# Patient Record
Sex: Female | Born: 1950
Health system: Southern US, Community
[De-identification: ages and names within clinical notes are randomized; demographics above are authoritative.]

## PROBLEM LIST (undated history)

## (undated) DIAGNOSIS — R5383 Other fatigue: Secondary | ICD-10-CM

## (undated) DIAGNOSIS — M5030 Other cervical disc degeneration, unspecified cervical region: Secondary | ICD-10-CM

## (undated) DIAGNOSIS — N979 Female infertility, unspecified: Secondary | ICD-10-CM

## (undated) DIAGNOSIS — M51369 Other intervertebral disc degeneration, lumbar region without mention of lumbar back pain or lower extremity pain: Secondary | ICD-10-CM

## (undated) DIAGNOSIS — M797 Fibromyalgia: Secondary | ICD-10-CM

## (undated) DIAGNOSIS — J45909 Unspecified asthma, uncomplicated: Secondary | ICD-10-CM

## (undated) DIAGNOSIS — M5136 Other intervertebral disc degeneration, lumbar region: Secondary | ICD-10-CM

## (undated) DIAGNOSIS — M1990 Unspecified osteoarthritis, unspecified site: Secondary | ICD-10-CM

## (undated) HISTORY — DX: Other fatigue: R53.83

## (undated) HISTORY — DX: Unspecified asthma, uncomplicated: J45.909

## (undated) HISTORY — DX: Fibromyalgia: M79.7

## (undated) HISTORY — DX: Other intervertebral disc degeneration, lumbar region: M51.36

## (undated) HISTORY — DX: Other cervical disc degeneration, unspecified cervical region: M50.30

## (undated) HISTORY — DX: Female infertility, unspecified: N97.9

## (undated) HISTORY — PX: GALLBLADDER SURGERY: SHX652

## (undated) HISTORY — DX: Unspecified osteoarthritis, unspecified site: M19.90

## (undated) HISTORY — PX: FOOT SURGERY: SHX648

## (undated) HISTORY — PX: KNEE SURGERY: SHX244

## (undated) HISTORY — DX: Other intervertebral disc degeneration, lumbar region without mention of lumbar back pain or lower extremity pain: M51.369

---

## 1998-02-06 ENCOUNTER — Encounter: Payer: Self-pay | Admitting: Emergency Medicine

## 1998-02-06 ENCOUNTER — Emergency Department (HOSPITAL_COMMUNITY): Admission: EM | Admit: 1998-02-06 | Discharge: 1998-02-06 | Payer: Self-pay | Admitting: Emergency Medicine

## 1998-03-01 ENCOUNTER — Encounter: Payer: Self-pay | Admitting: Orthopedic Surgery

## 1998-03-01 ENCOUNTER — Ambulatory Visit (HOSPITAL_COMMUNITY): Admission: RE | Admit: 1998-03-01 | Discharge: 1998-03-01 | Payer: Self-pay | Admitting: Orthopedic Surgery

## 1998-08-12 ENCOUNTER — Other Ambulatory Visit: Admission: RE | Admit: 1998-08-12 | Discharge: 1998-08-12 | Payer: Self-pay | Admitting: Gynecology

## 1998-11-25 ENCOUNTER — Ambulatory Visit (HOSPITAL_BASED_OUTPATIENT_CLINIC_OR_DEPARTMENT_OTHER): Admission: RE | Admit: 1998-11-25 | Discharge: 1998-11-25 | Payer: Self-pay | Admitting: Orthopedic Surgery

## 1999-02-26 ENCOUNTER — Other Ambulatory Visit: Admission: RE | Admit: 1999-02-26 | Discharge: 1999-02-26 | Payer: Self-pay | Admitting: Gynecology

## 1999-04-18 ENCOUNTER — Emergency Department (HOSPITAL_COMMUNITY): Admission: EM | Admit: 1999-04-18 | Discharge: 1999-04-19 | Payer: Self-pay | Admitting: Emergency Medicine

## 1999-04-18 ENCOUNTER — Encounter: Payer: Self-pay | Admitting: Emergency Medicine

## 1999-09-28 ENCOUNTER — Other Ambulatory Visit: Admission: RE | Admit: 1999-09-28 | Discharge: 1999-09-28 | Payer: Self-pay | Admitting: Gynecology

## 2000-03-09 ENCOUNTER — Other Ambulatory Visit: Admission: RE | Admit: 2000-03-09 | Discharge: 2000-03-09 | Payer: Self-pay | Admitting: Gynecology

## 2000-08-31 ENCOUNTER — Other Ambulatory Visit: Admission: RE | Admit: 2000-08-31 | Discharge: 2000-08-31 | Payer: Self-pay | Admitting: Gynecology

## 2001-03-15 ENCOUNTER — Other Ambulatory Visit: Admission: RE | Admit: 2001-03-15 | Discharge: 2001-03-15 | Payer: Self-pay | Admitting: Gynecology

## 2002-03-21 ENCOUNTER — Other Ambulatory Visit: Admission: RE | Admit: 2002-03-21 | Discharge: 2002-03-21 | Payer: Self-pay | Admitting: Gynecology

## 2003-04-25 ENCOUNTER — Other Ambulatory Visit: Admission: RE | Admit: 2003-04-25 | Discharge: 2003-04-25 | Payer: Self-pay | Admitting: Gynecology

## 2003-11-28 ENCOUNTER — Ambulatory Visit: Payer: Self-pay | Admitting: Internal Medicine

## 2004-01-09 ENCOUNTER — Ambulatory Visit: Payer: Self-pay | Admitting: Internal Medicine

## 2004-01-13 ENCOUNTER — Ambulatory Visit (HOSPITAL_COMMUNITY): Admission: RE | Admit: 2004-01-13 | Discharge: 2004-01-13 | Payer: Self-pay | Admitting: Internal Medicine

## 2004-01-16 ENCOUNTER — Ambulatory Visit: Payer: Self-pay | Admitting: Internal Medicine

## 2004-01-20 ENCOUNTER — Encounter: Admission: RE | Admit: 2004-01-20 | Discharge: 2004-01-20 | Payer: Self-pay | Admitting: Internal Medicine

## 2004-01-24 ENCOUNTER — Ambulatory Visit: Payer: Self-pay | Admitting: Internal Medicine

## 2004-04-15 ENCOUNTER — Ambulatory Visit: Payer: Self-pay | Admitting: Internal Medicine

## 2004-04-30 ENCOUNTER — Other Ambulatory Visit: Admission: RE | Admit: 2004-04-30 | Discharge: 2004-04-30 | Payer: Self-pay | Admitting: Gynecology

## 2004-05-15 ENCOUNTER — Ambulatory Visit (HOSPITAL_COMMUNITY): Admission: RE | Admit: 2004-05-15 | Discharge: 2004-05-15 | Payer: Self-pay | Admitting: Surgery

## 2004-05-15 ENCOUNTER — Encounter (INDEPENDENT_AMBULATORY_CARE_PROVIDER_SITE_OTHER): Payer: Self-pay | Admitting: Specialist

## 2005-02-23 ENCOUNTER — Other Ambulatory Visit: Admission: RE | Admit: 2005-02-23 | Discharge: 2005-02-23 | Payer: Self-pay | Admitting: Obstetrics and Gynecology

## 2005-06-30 ENCOUNTER — Ambulatory Visit: Payer: Self-pay | Admitting: Internal Medicine

## 2005-09-03 ENCOUNTER — Ambulatory Visit: Payer: Self-pay | Admitting: Internal Medicine

## 2008-12-10 ENCOUNTER — Emergency Department (HOSPITAL_COMMUNITY): Admission: EM | Admit: 2008-12-10 | Discharge: 2008-12-11 | Payer: Self-pay | Admitting: Emergency Medicine

## 2008-12-17 ENCOUNTER — Encounter: Admission: RE | Admit: 2008-12-17 | Discharge: 2008-12-17 | Payer: Self-pay | Admitting: Family Medicine

## 2009-06-27 ENCOUNTER — Encounter: Admission: RE | Admit: 2009-06-27 | Discharge: 2009-06-27 | Payer: Self-pay | Admitting: Family Medicine

## 2009-09-22 ENCOUNTER — Ambulatory Visit (HOSPITAL_BASED_OUTPATIENT_CLINIC_OR_DEPARTMENT_OTHER): Admission: RE | Admit: 2009-09-22 | Discharge: 2009-09-22 | Payer: Self-pay | Admitting: Rheumatology

## 2009-09-27 ENCOUNTER — Ambulatory Visit: Payer: Self-pay | Admitting: Internal Medicine

## 2010-01-08 ENCOUNTER — Encounter
Admission: RE | Admit: 2010-01-08 | Discharge: 2010-01-08 | Payer: Self-pay | Source: Home / Self Care | Attending: Obstetrics and Gynecology | Admitting: Obstetrics and Gynecology

## 2010-02-01 ENCOUNTER — Encounter: Payer: Self-pay | Admitting: Obstetrics and Gynecology

## 2010-02-01 ENCOUNTER — Encounter: Payer: Self-pay | Admitting: Oral and Maxillofacial Surgery

## 2010-04-14 LAB — POCT I-STAT, CHEM 8
Calcium, Ion: 1.13 mmol/L (ref 1.12–1.32)
Chloride: 96 mEq/L (ref 96–112)
HCT: 37 % (ref 36.0–46.0)
Potassium: 3.9 mEq/L (ref 3.5–5.1)

## 2010-04-15 LAB — COMPREHENSIVE METABOLIC PANEL
ALT: 44 U/L — ABNORMAL HIGH (ref 0–35)
AST: 22 U/L (ref 0–37)
Albumin: 3.9 g/dL (ref 3.5–5.2)
Alkaline Phosphatase: 83 U/L (ref 39–117)
GFR calc Af Amer: >60 mL/min (ref >60)
Glucose, Bld: 175 mg/dL — ABNORMAL HIGH (ref 70–99)
Potassium: 3.5 mEq/L (ref 3.5–5.1)
Sodium: 128 mEq/L — ABNORMAL LOW (ref 135–145)
Total Protein: 7.4 g/dL (ref 6.0–8.3)

## 2010-04-15 LAB — CBC
Hemoglobin: 12.6 g/dL (ref 12.0–15.0)
RDW: 14.2 % (ref 11.5–15.5)

## 2010-04-15 LAB — DIFFERENTIAL
Basophils Relative: 0 % (ref 0–1)
Eosinophils Absolute: 0.4 10*3/uL (ref 0.0–0.7)
Eosinophils Relative: 3 % (ref 0–5)
Monocytes Absolute: 0.6 10*3/uL (ref 0.1–1.0)
Monocytes Relative: 4 % (ref 3–12)

## 2010-04-15 LAB — URINALYSIS, ROUTINE W REFLEX MICROSCOPIC
Ketones, ur: 15 mg/dL — AB
Nitrite: NEGATIVE
Specific Gravity, Urine: 1.022 (ref 1.005–1.030)
pH: 6.5 (ref 5.0–8.0)

## 2010-05-29 NOTE — Assessment & Plan Note (Signed)
 HEALTHCARE                           GASTROENTEROLOGY OFFICE NOTE   Lauren Bradley, Lauren Bradley                      MRN:          [id number]  DATE:09/03/2005                            DOB:          1950-11-25    Lauren Bradley is a 60 year old white female with serial constipation and  obstipation, positive for Sitz marks for bowel transit.  She was on Miralax,  up to 34 grams twice a day, without any improvement, but she has found an  [place] preparation of multi-herb 120 capsules, she take 2 twice a day,  which completely controls her constipation.  She was found to be osteopenic,  and started on Actonel, but it caused her GI discomfort.  She switched to  [facility], which again she could not tolerate.   PHYSICAL EXAMINATION:  VITAL SIGNS:  Blood pressure 122/72, pulse 72, weight  105 pounds, which is stable.  Today her physical exam was normal.  LUNGS:  Clear to auscultation.  CARDIAC:  Cor with normal S1, normal S2.  ABDOMEN:  Soft, normoactive bowel sounds, nontender, no distention, no  palpable mass.  RECTAL:  Not done.   IMPRESSION:  44. 60 year old white female with colonic inertia, a redundant colon,      positive Sitz marks, currently doing well on multi-herb laxative.  2. Gastroesophageal reflux.  3. Intolerance to Actonel/Boniva.   PLAN:  1. I advised the patient to reduce the dose of the Boniva or Actonel, but      continue taking it because of the osteopenia.  2. Refills for Prevacid 30 mg a day dispense #90.  3. Continue multi-herb.  4. I will see her on a p.r.n. basis.                                   Hedwig Morton. Juanda Chance, MD   DMB/MedQ  DD:  09/03/2005  DT:  09/04/2005  Job #:  [id number]   cc:   Stacie Acres. Cliffton Asters, MD

## 2010-05-29 NOTE — Op Note (Signed)
[place]  Patient:    Lauren Bradley                       MRN: [id number] Proc. Date: 11/25/98 Adm. Date:  [id number] Attending:  Derrek Monaco CC:         Katy Fitch. Sypher, Montez Hageman., M.D. x 2                           Operative Report  PREOPERATIVE DIAGNOSIS:  Status post open reduction/internal fixation of left olecranon interarticular comminuted fracture with retained hardware.  POSTOPERATIVE DIAGNOSIS:  Status post open reduction/internal fixation of left olecranon interarticular comminuted fracture with retained hardware.  PROCEDURE:  Removal of retained 6.5 mm cancellous screw and washer, left olecranon.  SURGEON:  Katy Fitch. Sypher, Montez Hageman., M.D.  ASSISTANT:  None.  ANESTHESIA:  Axillary block.  ANESTHESIOLOGIST:  Dr. Krista Blue.  INDICATIONS:  [name] is approaching one year following a comminuted olecranon fracture sustained in a fall.  She underwent primary open reduction/internal fixation with use of a Vicryl tension band.  Unfortunately, ue to severe osteopenia, her fixation loosened and she required a second reapplication of a cancellous screw and washer to obtain adequate fixation.  She went on to heal  her fracture with the aid of an ultrasonic bone stimulator and is now trouble by her hardware irritating the triceps tendon.  She requested hardware removal.  After informed consent, she is brought to the operating room at this time for removal of her internal fixation foreign material.  PROCEDURE:  [name] is brought to the operating room and placed supine position on the operating room table.  Following axillary block placed in the holding area by Dr. Krista Blue, anesthesia was satisfactory in the left arm.  The arm was prepped with Betadine soap and solution and sterilely draped.  Following exsanguination of the limb with Esmarch bandage, arterial tourniquet is inflated to 240 mmHg.  Procedure commenced with  excision of previous surgical scar.  Subcutaneous tissues are carefully divided, taking care to palpate the hardware within the triceps tendon.  Her tendon had healed imperceptibly over the hardware, therefore, a C-arm fluoroscope was used with a 27-gauge needle to locate the screw.  Once this was  anatomically located with precision, the triceps tendon was split and the hardware immediately visualized.  The screw was removed without difficulty utilizing an AO screwdriver and the washer was removed with a Kocher clamp.  The tendon was split in its longitudinal fibers and the insertion of the tendon at the olecranon was not significantly disturbed.  The wound was inspected for bleeding points, which were electrocauterized with bipolar current, followed by repair of the skin with intradermal 3-0 Prolene suture.  A compressive dressing was applied with an Ace wrap.  There are no apparent complications.  Ms. Gorniak was placed in sling and transferred to the recovery room with stable vital signs.  Aftercare: she is given a prescription for Vicodin 1 or 2 tablets p.o. q.4-6h. p.r.n. pain total of 20 tablets without refill.  She will return to the office in follow-up in approximately 10 days for suture removal. DD:  11/25/98 TD:  11/26/98 Job: [id number]

## 2010-05-29 NOTE — Op Note (Signed)
Lauren Bradley, Lauren Bradley NO.:  [id number]   MEDICAL RECORD NO.:  [id number]          PATIENT TYPE:  AMB   LOCATION:  DAY                          FACILITY[facility]  [facility]   PHYSICIAN:  Currie Paris, M.D.DATE OF BIRTH:  June 25, 1950   DATE OF PROCEDURE:  05/15/2004  DATE OF DISCHARGE:                                 OPERATIVE REPORT   OFFICE MEDICAL RECORD NUMBER:  [id number]   PREOPERATIVE DIAGNOSES:  Biliary dyskinesia and chronic cholecystitis.   POSTOPERATIVE DIAGNOSES:  Biliary dyskinesia and chronic cholecystitis.   OPERATION:  Laparoscopic cholecystectomy with operative cholangiogram and  visualization of the left ovary.   SURGEON:  Dr. Jamey Ripa   ASSISTANT:  Dr. Orson Slick   ANESTHESIA:  General endotracheal.   CLINICAL HISTORY:  This lady has had biliary-type symptoms and an abnormal  gallbladder ejection fraction, although no stones have been found.  She has  been evaluated by her gastroenterologist, Dr. Lina Sar, and it thought  appropriate at this point to proceed to cholecystectomy.  On her recent exam  by Dr. Chevis Pretty, he was unable to palpate a left ovary, and asked [place] to try to  visualize it laparoscopically at the time of her surgery.   DESCRIPTION OF PROCEDURE:  The patient was seen in the holding area, and she  no further questions.  She was taken to the operating room and after  satisfactory general endotracheal anesthesia had been obtained, the abdomen  was prepped and draped.  The time-out occurred.   Marcaine 0.25% plain was used for each incision.  A vertical umbilical  incision was made, the fascia opened, and the peritoneal cavity entered  under direct vision.  A pursestring was placed, the Hasson introduced, and  the abdomen insufflated to 15.  Additional trocars were placed in the  epigastrium and two in the right upper quadrant.  The patient was placed in  Trendelenburg, and we were able to visualize the uterus which appeared to be  relatively normal, and the left ovary which was tucked a little behind her  sigmoid, looked very atrophic but otherwise unremarkable.  There was a  little tethering of the round ligament up to the anterior abdominal wall.   Attention was then turned back to the gallbladder.  The patient was placed  in reverse Trendelenburg.  The gallbladder was retracted over the liver.  The perineum over the cystic duct was opened with the gallbladder neck being  retracted laterally and inferiorly.  I was able to identify a long cystic  duct, the cystic artery, and opened up the perineum so we had a good view of  the triangle of Calot.   A clip was placed in the cystic duct near the gallbladder, and one clip on  the cystic artery.  The cystic duct was opened, and a percutaneously placed  [facility] catheter was used for operative cholangiography.  This appeared to be  normal.   The catheter was removed and three clips placed on the stay side of the  cystic duct.  It was divided.  Two additional clips were placed on the  cystic  artery, and it was divided leaving two clips on the stay side.  The  gallbladder was removed from below to above with coagulation current of the  cautery.  Once it was disconnected, we irrigated and made sure everything  was dry.  The gallbladder was brought out the umbilical port.  The umbilical  port was closed with the pursestring.  The lateral ports were removed under  direct vision, and there was no bleeding.  A final check was made in the  right upper quadrant, and there had been no collection of fluid, blood, or  bile.  The abdomen was deflated through the epigastric port.  Skin was  closed with 4-0 Monocryl subcuticular and Dermabond.   The patient tolerated procedure well.  There were no operative  complications.  All counts were correct.      CJS/MEDQ  D:  05/15/2004  T:  05/15/2004  Job:  [id number]   cc:   Lina Sar, M.D. [facility]   Stacie Acres. White, M.D.  [address]  [place]  [place] [address]  Fax: [phone]

## 2010-06-09 ENCOUNTER — Other Ambulatory Visit: Payer: Self-pay | Admitting: Obstetrics and Gynecology

## 2010-06-09 DIAGNOSIS — N6459 Other signs and symptoms in breast: Secondary | ICD-10-CM

## 2010-06-29 ENCOUNTER — Ambulatory Visit
Admission: RE | Admit: 2010-06-29 | Discharge: 2010-06-29 | Disposition: A | Discharge status: Home / Self Care | Payer: BC Managed Care – PPO | Source: Ambulatory Visit | Attending: Obstetrics and Gynecology | Admitting: Obstetrics and Gynecology

## 2010-06-29 DIAGNOSIS — N6459 Other signs and symptoms in breast: Secondary | ICD-10-CM

## 2011-06-03 ENCOUNTER — Other Ambulatory Visit: Payer: Self-pay | Admitting: Obstetrics and Gynecology

## 2011-06-03 DIAGNOSIS — Z1231 Encounter for screening mammogram for malignant neoplasm of breast: Secondary | ICD-10-CM

## 2011-07-01 ENCOUNTER — Ambulatory Visit
Admission: RE | Admit: 2011-07-01 | Discharge: 2011-07-01 | Disposition: A | Discharge status: Home / Self Care | Payer: BC Managed Care – PPO | Source: Ambulatory Visit | Attending: Obstetrics and Gynecology | Admitting: Obstetrics and Gynecology

## 2011-07-01 DIAGNOSIS — Z1231 Encounter for screening mammogram for malignant neoplasm of breast: Secondary | ICD-10-CM

## 2012-06-07 ENCOUNTER — Other Ambulatory Visit: Payer: Self-pay | Admitting: Obstetrics and Gynecology

## 2012-06-07 ENCOUNTER — Other Ambulatory Visit: Payer: Self-pay

## 2012-06-07 DIAGNOSIS — Z1231 Encounter for screening mammogram for malignant neoplasm of breast: Secondary | ICD-10-CM

## 2012-07-06 ENCOUNTER — Ambulatory Visit
Admission: RE | Admit: 2012-07-06 | Discharge: 2012-07-06 | Disposition: A | Discharge status: Home / Self Care | Payer: BC Managed Care – PPO | Source: Ambulatory Visit | Attending: Obstetrics and Gynecology | Admitting: Obstetrics and Gynecology

## 2012-07-06 DIAGNOSIS — Z1231 Encounter for screening mammogram for malignant neoplasm of breast: Secondary | ICD-10-CM

## 2012-11-09 ENCOUNTER — Ambulatory Visit (INDEPENDENT_AMBULATORY_CARE_PROVIDER_SITE_OTHER): Payer: Self-pay | Admitting: Radiology

## 2012-11-09 ENCOUNTER — Ambulatory Visit (INDEPENDENT_AMBULATORY_CARE_PROVIDER_SITE_OTHER): Payer: BC Managed Care – PPO | Admitting: Neurology

## 2012-11-09 DIAGNOSIS — G5600 Carpal tunnel syndrome, unspecified upper limb: Secondary | ICD-10-CM

## 2012-11-09 DIAGNOSIS — G5602 Carpal tunnel syndrome, left upper limb: Secondary | ICD-10-CM

## 2012-11-09 DIAGNOSIS — Z0289 Encounter for other administrative examinations: Secondary | ICD-10-CM

## 2012-11-09 NOTE — Procedures (Signed)
  HISTORY:  Lauren Bradley is a 62 year old patient with a history of a fracture of the left elbow. The patient has been having some numbness in the fingers of the left hand, and some discomfort in the extensor portion of the left forearm. The patient is being evaluated for a possible neuropathy or a cervical radiculopathy.  NERVE CONDUCTION STUDIES:  Nerve conduction studies were performed on the left upper extremity. The distal motor latencies and motor amplitudes for the left median and ulnar nerves were normal. The F wave latencies and nerve conduction velocities for these nerves were normal. The sensory latencies for the left median, ulnar, and radial nerves were slightly prolonged.  EMG STUDIES:  EMG study was performed on the left upper extremity:  The first dorsal interosseous muscle reveals 2 to 4 K units with full recruitment. No fibrillations or positive waves were noted. The abductor pollicis brevis muscle reveals 2 to 5 K units with decreased recruitment. No fibrillations or positive waves were noted. The extensor indicis proprius muscle reveals 1 to 3 K units with full recruitment. No fibrillations or positive waves were noted. The pronator teres muscle reveals 2 to 3 K units with full recruitment. No fibrillations or positive waves were noted. The flexor digitorum profundus muscle (III-IV) reveals 2 to 4 K units with full recruitment. No fibrillations or positive waves were seen. The biceps muscle reveals 1 to 2 K units with full recruitment. No fibrillations or positive waves were noted. Complex repetitive discharges were seen. The triceps muscle reveals 2 to 4 K units with full recruitment. No fibrillations or positive waves were noted. Complex repetitive discharges were seen. The anterior deltoid muscle reveals 2 to 3 K units with full recruitment. No fibrillations or positive waves were noted. The cervical paraspinal muscles were tested at 2 levels. No abnormalities of  insertional activity were seen at either level tested. Complex repetitive discharges were seen at both levels. There was good relaxation.   IMPRESSION:  Nerve conduction studies of the left upper extremity shows diffuse sensory abnormalities involving the median, ulnar, and radial nerves. If more diffuse sensory complaints of the extremities are noted, this finding could be associated with a more generalized peripheral neuropathy. Clinical correlation is required. EMG evaluation of the left upper extremity shows findings consistent with a chronic, healed, left carpal tunnel syndrome. There is no evidence of a left ulnar neuropathy. Complex repetitive discharges were seen in the left biceps, triceps, and cervical paraspinal muscles. This finding could suggest a chronic, very low-grade cervical radiculopathy involving the C6 and/or C7 nerve roots. Again, clinical correlation is required.  Marlan Palau MD 11/09/2012 4:39 PM  [facility] [address] [place], [place] [address]  Phone [phone] Fax [phone]

## 2013-06-22 ENCOUNTER — Other Ambulatory Visit: Payer: Self-pay

## 2013-06-22 DIAGNOSIS — Z1231 Encounter for screening mammogram for malignant neoplasm of breast: Secondary | ICD-10-CM

## 2013-07-09 ENCOUNTER — Ambulatory Visit
Admission: RE | Admit: 2013-07-09 | Discharge: 2013-07-09 | Disposition: A | Discharge status: Home / Self Care | Payer: BC Managed Care – PPO | Source: Ambulatory Visit

## 2013-07-09 DIAGNOSIS — Z1231 Encounter for screening mammogram for malignant neoplasm of breast: Secondary | ICD-10-CM

## 2013-09-04 ENCOUNTER — Other Ambulatory Visit: Payer: Self-pay

## 2013-09-05 LAB — CYTOLOGY - PAP

## 2014-02-06 ENCOUNTER — Encounter: Payer: Self-pay | Admitting: Internal Medicine

## 2014-07-30 ENCOUNTER — Other Ambulatory Visit: Payer: Self-pay

## 2014-07-30 DIAGNOSIS — Z1231 Encounter for screening mammogram for malignant neoplasm of breast: Secondary | ICD-10-CM

## 2014-09-03 ENCOUNTER — Ambulatory Visit: Payer: Self-pay

## 2014-09-04 ENCOUNTER — Other Ambulatory Visit: Payer: Self-pay | Admitting: Gastroenterology

## 2014-09-09 ENCOUNTER — Ambulatory Visit
Admission: RE | Admit: 2014-09-09 | Discharge: 2014-09-09 | Disposition: A | Discharge status: Home / Self Care | Payer: BLUE CROSS/BLUE SHIELD | Source: Ambulatory Visit

## 2014-09-09 ENCOUNTER — Ambulatory Visit: Payer: Self-pay

## 2014-09-09 DIAGNOSIS — Z1231 Encounter for screening mammogram for malignant neoplasm of breast: Secondary | ICD-10-CM

## 2014-09-10 ENCOUNTER — Other Ambulatory Visit: Payer: Self-pay | Admitting: Obstetrics and Gynecology

## 2014-09-11 LAB — CYTOLOGY - PAP

## 2015-05-20 ENCOUNTER — Other Ambulatory Visit: Payer: Self-pay | Admitting: Obstetrics and Gynecology

## 2015-05-20 DIAGNOSIS — N6001 Solitary cyst of right breast: Secondary | ICD-10-CM

## 2015-05-27 ENCOUNTER — Other Ambulatory Visit: Payer: BLUE CROSS/BLUE SHIELD

## 2015-06-06 ENCOUNTER — Other Ambulatory Visit: Payer: BLUE CROSS/BLUE SHIELD

## 2015-07-11 ENCOUNTER — Institutional Professional Consult (permissible substitution): Payer: BLUE CROSS/BLUE SHIELD | Admitting: Internal Medicine

## 2015-08-18 ENCOUNTER — Institutional Professional Consult (permissible substitution): Payer: BLUE CROSS/BLUE SHIELD | Admitting: Internal Medicine

## 2015-10-09 ENCOUNTER — Other Ambulatory Visit: Payer: Self-pay | Admitting: Obstetrics and Gynecology

## 2015-10-09 DIAGNOSIS — R928 Other abnormal and inconclusive findings on diagnostic imaging of breast: Secondary | ICD-10-CM

## 2015-10-14 ENCOUNTER — Other Ambulatory Visit: Payer: Self-pay | Admitting: Obstetrics and Gynecology

## 2015-10-14 ENCOUNTER — Ambulatory Visit
Admission: RE | Admit: 2015-10-14 | Discharge: 2015-10-14 | Disposition: A | Discharge status: Home / Self Care | Payer: BLUE CROSS/BLUE SHIELD | Source: Ambulatory Visit | Attending: Obstetrics and Gynecology | Admitting: Obstetrics and Gynecology

## 2015-10-14 DIAGNOSIS — R928 Other abnormal and inconclusive findings on diagnostic imaging of breast: Secondary | ICD-10-CM

## 2015-12-11 ENCOUNTER — Other Ambulatory Visit (INDEPENDENT_AMBULATORY_CARE_PROVIDER_SITE_OTHER): Payer: Self-pay | Admitting: Rheumatology

## 2015-12-11 MED ORDER — TRAMADOL HCL 50 MG PO TABS: 50.0000 mg | ORAL_TABLET | Freq: Three times a day (TID) | ORAL | 2 refills | Status: DC | PRN

## 2015-12-11 NOTE — Telephone Encounter (Signed)
Patient is requesting 1 month supply / 90 tablets of tramadol   [facility] [address] [phone]  Cb#: [phone]

## 2015-12-11 NOTE — Telephone Encounter (Signed)
UDS 09/02/15 Last visit 09/02/15 Next visit 12/30/15 Narcotic agreement 04/28/15 Ok to refill Tramadol ?

## 2015-12-12 ENCOUNTER — Telehealth: Payer: Self-pay | Admitting: Rheumatology

## 2015-12-12 NOTE — Telephone Encounter (Signed)
Patient says that [facility] on [address] does not have rx for Tramadol. Can you please resend?

## 2015-12-12 NOTE — Telephone Encounter (Signed)
Called it in since fax was not received. Thank you

## 2015-12-25 ENCOUNTER — Other Ambulatory Visit: Payer: Self-pay | Admitting: Rheumatology

## 2015-12-29 DIAGNOSIS — M8589 Other specified disorders of bone density and structure, multiple sites: Secondary | ICD-10-CM | POA: Insufficient documentation

## 2015-12-29 DIAGNOSIS — Z889 Allergy status to unspecified drugs, medicaments and biological substances status: Secondary | ICD-10-CM | POA: Insufficient documentation

## 2015-12-29 DIAGNOSIS — M51369 Other intervertebral disc degeneration, lumbar region without mention of lumbar back pain or lower extremity pain: Secondary | ICD-10-CM | POA: Insufficient documentation

## 2015-12-29 DIAGNOSIS — M19041 Primary osteoarthritis, right hand: Secondary | ICD-10-CM | POA: Insufficient documentation

## 2015-12-29 DIAGNOSIS — M19071 Primary osteoarthritis, right ankle and foot: Secondary | ICD-10-CM | POA: Insufficient documentation

## 2015-12-29 DIAGNOSIS — M19072 Primary osteoarthritis, left ankle and foot: Secondary | ICD-10-CM

## 2015-12-29 DIAGNOSIS — M797 Fibromyalgia: Secondary | ICD-10-CM | POA: Insufficient documentation

## 2015-12-29 DIAGNOSIS — M19042 Primary osteoarthritis, left hand: Secondary | ICD-10-CM | POA: Insufficient documentation

## 2015-12-29 DIAGNOSIS — R5383 Other fatigue: Secondary | ICD-10-CM | POA: Insufficient documentation

## 2015-12-29 DIAGNOSIS — M47812 Spondylosis without myelopathy or radiculopathy, cervical region: Secondary | ICD-10-CM | POA: Insufficient documentation

## 2015-12-29 DIAGNOSIS — Z8669 Personal history of other diseases of the nervous system and sense organs: Secondary | ICD-10-CM | POA: Insufficient documentation

## 2015-12-29 DIAGNOSIS — M5136 Other intervertebral disc degeneration, lumbar region: Secondary | ICD-10-CM | POA: Insufficient documentation

## 2015-12-29 NOTE — Progress Notes (Signed)
Office Visit Note  Patient: Lauren Bradley             Date of Birth: Feb 16, 1950           MRN: [id number]             PCP: Irven Shelling, MD Referring: Lavone Orn, MD Visit Date: 12/30/2015 Occupation: @GUAROCC @    Subjective:  No chief complaint on file. Follow-up on fibromyalgia  History of Present Illness: Lauren Bradley is a 65 y.o. female  Last seen 09/02/2015. Patient is having a lot of stress/anxiety in her life. Unable to minimize it at this time.  Rates her fibromyalgia discomfort as 8 on a scale of 0-10 with fatigue also rated 8. Uses Xanax for anxiety Using Cymbalta for fibromyalgia pain but has increased her dosage from 60 mg to 90 mg over the last 2 days and has not felt well and I have encouraged the patient that 90 mg generally causes more side effect and less extra relief and it's best to do 60 mg and patient is agreeable. She will back to 60 mg. She is using tramadol usually 1 pill a night for pain. We just refilled it 12/11/2015. She has 2 refills. Having a lot of trapezius muscle spasms and I offered her lidocaine injection. She cannot take cortisone because in the past cortisone caused dimpling in her skin. This happened when she was 65 years old and somebody gave her an injection in her left deltoid area according to the patient. Patient states that she will be 79 in September. She will be on Medicare at that time. She would like to return to clinic after she is on Medicare.    Activities of Daily Living:  Patient reports morning stiffness for 30 minutes.   Patient Reports nocturnal pain.  Difficulty dressing/grooming: Denies Difficulty climbing stairs: Reports Difficulty getting out of chair: Denies Difficulty using hands for taps, buttons, cutlery, and/or writing: Reports   Review of Systems  Constitutional: Positive for fatigue.  HENT: Negative for mouth sores and mouth dryness.   Eyes: Negative for dryness.  Respiratory: Negative for  shortness of breath.   Gastrointestinal: Negative for constipation and diarrhea.  Musculoskeletal: Positive for myalgias and myalgias.  Skin: Negative for sensitivity to sunlight.  Psychiatric/Behavioral: Positive for sleep disturbance. Negative for decreased concentration.    PMFS History:  Patient Active Problem List   Diagnosis Date Noted  . History of seasonal allergies 12/29/2015  . Fibromyalgia 12/29/2015  . Other fatigue 12/29/2015  . Primary osteoarthritis of both hands 12/29/2015  . Primary osteoarthritis of both feet 12/29/2015  . DJD (degenerative joint disease), cervical 12/29/2015  . Spondylosis of lumbar region without myelopathy or radiculopathy 12/29/2015  . Osteopenia of multiple sites 12/29/2015  . History of migraine 12/29/2015    Past Medical History:  Diagnosis Date  . Asthma   . Fatigue   . Infertility, female     No family history on file. Past Surgical History:  Procedure Laterality Date  . CESAREAN SECTION    . GALLBLADDER SURGERY     Social History   Social History Narrative  . No narrative on file     Objective: Vital Signs: BP (!) 147/85 (BP Location: Left Arm, Patient Position: Sitting, Cuff Size: Large)   Pulse 96   Resp 14   Ht 5\' 3"  (1.6 m)   Wt 100 lb (45.4 kg)   BMI 17.71 kg/m    Physical Exam  Constitutional: She is oriented  to person, place, and time. She appears well-developed and well-nourished.  HENT:  Head: Normocephalic and atraumatic.  Eyes: EOM are normal. Pupils are equal, round, and reactive to light.  Cardiovascular: Normal rate, regular rhythm and normal heart sounds.  Exam reveals no gallop and no friction rub.   No murmur heard. Pulmonary/Chest: Effort normal and breath sounds normal. She has no wheezes. She has no rales.  Abdominal: Soft. Bowel sounds are normal. She exhibits no distension. There is no tenderness. There is no guarding. No hernia.  Musculoskeletal: Normal range of motion. She exhibits no edema,  tenderness or deformity.  Lymphadenopathy:    She has no cervical adenopathy.  Neurological: She is alert and oriented to person, place, and time. Coordination normal.  Skin: Skin is warm and dry. Capillary refill takes less than 2 seconds. No rash noted.  Psychiatric: She has a normal mood and affect. Her behavior is normal.     Musculoskeletal Exam:  Full range of motion of all joints Grip strength is equal and strong bilaterally Fibromyalgia tender points are 2 out of 18 positive  CDAI Exam: No CDAI exam completed.  No synovitis on examination  Investigation: No additional findings.   Imaging: No results found.  Speciality Comments: No specialty comments available.    Procedures:  Trigger Point Inj Date/Time: 12/30/2015 4:08 PM Performed by: Eliezer Lofts Authorized by: Eliezer Lofts   Consent Given by:  Patient Site marked: the procedure site was marked   Timeout: prior to procedure the correct patient, procedure, and site was verified   Indications:  Muscle spasm and pain Total # of Trigger Points:  2 Location: neck   Needle Size:  27 G Approach:  Dorsal Medications #1:  0.5 mL lidocaine 1 % Medications #2:  0.5 mL lidocaine 1 % Patient tolerance:  Patient tolerated the procedure well with no immediate complications Comments: Bilateral trapezius muscles were injected with 1% lidocaine without epinephrine at 0.5 ML's. No cortisone was used. Patient has a history of dimpling at the injection site in the past when she was 65 years old. So Kenalog was not used Patient tolerated procedure well. There are no complications Note patient's blood pressure is 147/85 today and Kenalog was also avoided.    Allergies: Hydrocodone; Codeine; Sulfa antibiotics; Cephalosporins; and Sulfamethoxazole   Assessment / Plan:     Visit Diagnoses: Fibromyalgia  Other fatigue - Plan: COMPLETE METABOLIC PANEL WITH GFR, CBC with Differential/Platelet  Primary osteoarthritis of  both hands  Primary osteoarthritis of both feet  DJD (degenerative joint disease), cervical  Spondylosis of lumbar region without myelopathy or radiculopathy  Osteopenia of multiple sites  History of migraine  History of seasonal allergies  Plan: #1: I advised the patient not to use 90 mg of Cymbalta. Instead 60 mg is usually what we prescribed to most of our patients. #2: See PCP or cardiologist for the elevated blood pressure: 147/85 today. #3: Patient's left third PIP with angulation is being held by the ring splint that we prescribed for her. #4: We refilled tramadol recently and advised patient that we cannot give her 90 day supply of tramadol because she has enough tramadol already. #5: Patient states that it would be better for her to return back after September 2018 when she is 62 and is on Medicare. I'm agreeable and I have put her schedule return visit for after September 2018. #6: Patient uses Xanax for sleep. Use as little of that medication as possible secondary to the fact that  it's a benzodiazepine. It does have an addiction potential.  Orders: Orders Placed This Encounter  Procedures  . Trigger Point Injection  . COMPLETE METABOLIC PANEL WITH GFR  . CBC with Differential/Platelet   No orders of the defined types were placed in this encounter.   Face-to-face time spent with patient was 30 minutes. 50% of time was spent in counseling and coordination of care.  Follow-Up Instructions: Return in about 9 months (around 09/29/2016) for FMS, fatigue, insomnia,.   Eliezer Lofts, PA-C

## 2015-12-30 ENCOUNTER — Ambulatory Visit (INDEPENDENT_AMBULATORY_CARE_PROVIDER_SITE_OTHER): Payer: BLUE CROSS/BLUE SHIELD | Admitting: Rheumatology

## 2015-12-30 ENCOUNTER — Encounter: Payer: Self-pay | Admitting: Rheumatology

## 2015-12-30 VITALS — BP 147/85 | HR 96 | Resp 14 | Ht 63.0 in | Wt 100.0 lb

## 2015-12-30 DIAGNOSIS — M8589 Other specified disorders of bone density and structure, multiple sites: Secondary | ICD-10-CM

## 2015-12-30 DIAGNOSIS — M47816 Spondylosis without myelopathy or radiculopathy, lumbar region: Secondary | ICD-10-CM | POA: Diagnosis not present

## 2015-12-30 DIAGNOSIS — M19041 Primary osteoarthritis, right hand: Secondary | ICD-10-CM

## 2015-12-30 DIAGNOSIS — M19071 Primary osteoarthritis, right ankle and foot: Secondary | ICD-10-CM

## 2015-12-30 DIAGNOSIS — M19042 Primary osteoarthritis, left hand: Secondary | ICD-10-CM

## 2015-12-30 DIAGNOSIS — M62838 Other muscle spasm: Secondary | ICD-10-CM

## 2015-12-30 DIAGNOSIS — Z889 Allergy status to unspecified drugs, medicaments and biological substances status: Secondary | ICD-10-CM | POA: Diagnosis not present

## 2015-12-30 DIAGNOSIS — R5383 Other fatigue: Secondary | ICD-10-CM | POA: Diagnosis not present

## 2015-12-30 DIAGNOSIS — Z8669 Personal history of other diseases of the nervous system and sense organs: Secondary | ICD-10-CM | POA: Diagnosis not present

## 2015-12-30 DIAGNOSIS — M797 Fibromyalgia: Secondary | ICD-10-CM | POA: Diagnosis not present

## 2015-12-30 DIAGNOSIS — M5030 Other cervical disc degeneration, unspecified cervical region: Secondary | ICD-10-CM | POA: Diagnosis not present

## 2015-12-30 DIAGNOSIS — M47812 Spondylosis without myelopathy or radiculopathy, cervical region: Secondary | ICD-10-CM

## 2015-12-30 DIAGNOSIS — M19072 Primary osteoarthritis, left ankle and foot: Secondary | ICD-10-CM | POA: Diagnosis not present

## 2015-12-30 LAB — CBC WITH DIFFERENTIAL/PLATELET
Basophils Absolute: 0 cells/uL (ref 0–200)
Basophils Relative: 0 %
EOS ABS: 134 {cells}/uL (ref 15–500)
EOS PCT: 2 %
HCT: 38.8 % (ref 35.0–45.0)
HEMOGLOBIN: 12.9 g/dL (ref 11.7–15.5)
Lymphocytes Relative: 19 %
Lymphs Abs: 1273 cells/uL (ref 850–3900)
MCH: 30.3 pg (ref 27.0–33.0)
MCHC: 33.2 g/dL (ref 32.0–36.0)
MCV: 91.1 fL (ref 80.0–100.0)
MONOS PCT: 6 %
MPV: 9.7 fL (ref 7.5–12.5)
Monocytes Absolute: 402 cells/uL (ref 200–950)
NEUTROS ABS: 4891 {cells}/uL (ref 1500–7800)
NEUTROS PCT: 73 %
Platelets: 326 10*3/uL (ref 140–400)
RBC: 4.26 MIL/uL (ref 3.80–5.10)
RDW: 13.4 % (ref 11.0–15.0)
WBC: 6.7 10*3/uL (ref 3.8–10.8)

## 2015-12-30 LAB — COMPLETE METABOLIC PANEL WITH GFR
ALBUMIN: 4.4 g/dL (ref 3.6–5.1)
ALK PHOS: 28 U/L — AB (ref 33–130)
ALT: 21 U/L (ref 6–29)
AST: 20 U/L (ref 10–35)
BILIRUBIN TOTAL: 0.5 mg/dL (ref 0.2–1.2)
BUN: 14 mg/dL (ref 7–25)
CO2: 29 mmol/L (ref 20–31)
CREATININE: 0.71 mg/dL (ref 0.50–0.99)
Calcium: 9.5 mg/dL (ref 8.6–10.4)
Chloride: 102 mmol/L (ref 98–110)
GFR, Est African American: >89 mL/min (ref >=60)
GLUCOSE: 96 mg/dL (ref 65–99)
Potassium: 4.4 mmol/L (ref 3.5–5.3)
SODIUM: 139 mmol/L (ref 135–146)
TOTAL PROTEIN: 6.8 g/dL (ref 6.1–8.1)

## 2015-12-30 MED ORDER — LIDOCAINE HCL 1 % IJ SOLN
0.5000 mL | INTRAMUSCULAR | Status: AC | PRN
  Administered 2015-12-30: .5 mL

## 2015-12-31 NOTE — Progress Notes (Signed)
Tell patient#1: CBC with differential is normal#2: CMP with GFR is normal except alkaline phosphatase slightly low at 28 but close to normal limits which we can monitor.No change in treatment.Please send copy of these labs to PCP

## 2016-01-01 ENCOUNTER — Telehealth: Payer: Self-pay | Admitting: Rheumatology

## 2016-01-01 NOTE — Telephone Encounter (Signed)
Reviewed labs with patient. She is going to set up My Chart for access to her lab results.

## 2016-01-01 NOTE — Telephone Encounter (Signed)
Patient is requesting a copy of lab results please be mailed to her home address.

## 2016-01-07 ENCOUNTER — Other Ambulatory Visit: Payer: Self-pay | Admitting: Rheumatology

## 2016-01-08 NOTE — Telephone Encounter (Signed)
Last Visit: 12/30/15 Next Visit: 09/29/16 Labs: 12/30/15 Alk. Phos. Slightly low  Okay to refill Meclizine?

## 2016-04-11 ENCOUNTER — Other Ambulatory Visit: Payer: Self-pay | Admitting: Rheumatology

## 2016-04-13 ENCOUNTER — Telehealth: Payer: Self-pay | Admitting: Rheumatology

## 2016-04-13 NOTE — Telephone Encounter (Signed)
Patient advised that we need her to update her narcotic agreement. Patient advised will send it to her and have her mail back to the office. 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 Patient would like to know if this could be related to her Fibromyalgia.

## 2016-04-13 NOTE — Telephone Encounter (Signed)
Edema in the back unrelated to FMS

## 2016-04-13 NOTE — Telephone Encounter (Signed)
Patient returned your call.

## 2016-04-13 NOTE — Telephone Encounter (Signed)
Attempted to contact the patient and left message for patient to call the office.  

## 2016-04-13 NOTE — Telephone Encounter (Signed)
Last Visit: 12/30/15 Next Visit: 09/29/16 UDS: 09/02/15 Narc Agreement: 04/28/15  Will send patient a narc agreement to update.   Okay to refill Tramadol?

## 2016-04-14 ENCOUNTER — Telehealth: Payer: Self-pay | Admitting: Rheumatology

## 2016-04-14 NOTE — Telephone Encounter (Signed)
See previous phone note.  

## 2016-04-14 NOTE — Telephone Encounter (Signed)
Patient advised edema in back is not related to FMS. Patient verbalized understanding.

## 2016-04-14 NOTE — Telephone Encounter (Signed)
Patient returned your call.  CB#[phone].  Thank you.

## 2016-08-03 ENCOUNTER — Other Ambulatory Visit: Payer: Self-pay | Admitting: *Deleted

## 2016-08-03 MED ORDER — TRAMADOL HCL 50 MG PO TABS: 50.0000 mg | ORAL_TABLET | Freq: Three times a day (TID) | ORAL | 0 refills | Status: DC | PRN

## 2016-08-03 NOTE — Telephone Encounter (Signed)
ok 

## 2016-08-03 NOTE — Telephone Encounter (Signed)
Refill request received via fax   Last Visit: 12/30/15 Next Visit: 09/29/16 UDS: 09/02/15 Narc Agreement: 04/30/16  Okay to refill Tramadol?

## 2016-08-13 ENCOUNTER — Telehealth: Payer: Self-pay | Admitting: Rheumatology

## 2016-08-13 MED ORDER — TRAMADOL HCL 50 MG PO TABS: 50.0000 mg | ORAL_TABLET | Freq: Three times a day (TID) | ORAL | 0 refills | Status: DC | PRN

## 2016-08-13 NOTE — Telephone Encounter (Signed)
Patient left a message on voicemail that last rx for Tramadol was sent into [facility] [address], and was suupose to be sent into [facility] on [address]. Could you please resend RX to correct pharmacy.

## 2016-08-13 NOTE — Telephone Encounter (Signed)
Verfied prescription was not sent to the [facility]. Phoned prescription to [facility] Left message to advise patient prescription has been called to the [facility],

## 2016-09-29 ENCOUNTER — Ambulatory Visit: Payer: BLUE CROSS/BLUE SHIELD | Admitting: Rheumatology

## 2016-09-30 NOTE — Progress Notes (Deleted)
   Office Visit Note  Patient: Lauren Bradley             Date of Birth: 07-05-1950           MRN: [id number]             PCP: Lavone Orn, MD Referring: Lavone Orn, MD Visit Date: 10/14/2016 Occupation: @GUAROCC @    Subjective:  No chief complaint on file.   History of Present Illness: Lauren Bradley is a 66 y.o. female ***   Activities of Daily Living:  Patient reports morning stiffness for *** {minute/hour:19697}.   Patient {ACTIONS;DENIES/REPORTS:21021675::"Denies"} nocturnal pain.  Difficulty dressing/grooming: {ACTIONS;DENIES/REPORTS:21021675::"Denies"} Difficulty climbing stairs: {ACTIONS;DENIES/REPORTS:21021675::"Denies"} Difficulty getting out of chair: {ACTIONS;DENIES/REPORTS:21021675::"Denies"} Difficulty using hands for taps, buttons, cutlery, and/or writing: {ACTIONS;DENIES/REPORTS:21021675::"Denies"}   No Rheumatology ROS completed.   PMFS History:  Patient Active Problem List   Diagnosis Date Noted  . History of vitamin D deficiency 10/12/2016  . History of seasonal allergies 12/29/2015  . Fibromyalgia 12/29/2015  . Other fatigue 12/29/2015  . Primary osteoarthritis of both hands 12/29/2015  . Primary osteoarthritis of both feet 12/29/2015  . DJD (degenerative joint disease), cervical 12/29/2015  . DDD (degenerative disc disease), lumbar 12/29/2015  . Osteopenia of multiple sites 12/29/2015  . History of migraine 12/29/2015    Past Medical History:  Diagnosis Date  . Asthma   . Fatigue   . Infertility, female     No family history on file. Past Surgical History:  Procedure Laterality Date  . CESAREAN SECTION    . GALLBLADDER SURGERY     Social History   Social History Narrative  . No narrative on file     Objective: Vital Signs: There were no vitals taken for this visit.   Physical Exam   Musculoskeletal Exam: ***  CDAI Exam: No CDAI exam completed.    Investigation: Findings:  UDS: 09/02/15 Narc Agreement: 04/30/16  12/30/2015 CBC normal, CMP normal   Imaging: No results found.  Speciality Comments: No specialty comments available.    Procedures:  No procedures performed Allergies: Hydrocodone; Codeine; Sulfa antibiotics; Cephalosporins; and Sulfamethoxazole   Assessment / Plan:     Visit Diagnoses: Fibromyalgia  Other fatigue  Primary osteoarthritis of both hands  Primary osteoarthritis of both feet  DJD (degenerative joint disease), cervical  DDD (degenerative disc disease), lumbar  Other chronic pain - On Cymbalta 30 mg by mouth daily, tramadol 50 mg 1 tablet by mouth 3 times a day when necessary   Osteopenia of multiple sites  History of vitamin D deficiency  History of seasonal allergies  History of migraine    Orders: No orders of the defined types were placed in this encounter.  No orders of the defined types were placed in this encounter.   Face-to-face time spent with patient was *** minutes. 50% of time was spent in counseling and coordination of care.  Follow-Up Instructions: No Follow-up on file.   [name], MD  Note - This record has been created using Editor, commissioning.  Chart creation errors have been sought, but may not always  have been located. Such creation errors do not reflect on  the standard of medical care.

## 2016-10-12 DIAGNOSIS — Z8639 Personal history of other endocrine, nutritional and metabolic disease: Secondary | ICD-10-CM | POA: Insufficient documentation

## 2016-10-13 DIAGNOSIS — G43709 Chronic migraine without aura, not intractable, without status migrainosus: Secondary | ICD-10-CM | POA: Diagnosis not present

## 2016-10-14 ENCOUNTER — Ambulatory Visit: Payer: BLUE CROSS/BLUE SHIELD | Admitting: Rheumatology

## 2016-10-14 DIAGNOSIS — F322 Major depressive disorder, single episode, severe without psychotic features: Secondary | ICD-10-CM | POA: Diagnosis not present

## 2016-10-20 ENCOUNTER — Other Ambulatory Visit: Payer: Self-pay | Admitting: Rheumatology

## 2016-10-20 MED ORDER — TRAMADOL HCL 50 MG PO TABS: 50.0000 mg | ORAL_TABLET | Freq: Three times a day (TID) | ORAL | 0 refills | Status: DC | PRN

## 2016-10-20 NOTE — Telephone Encounter (Signed)
Last Visit: 12/30/15 Next Visit: 12/31/16 UDS: 09/02/15 Narc Agreement: 04/30/16  Attempted to contact the patient. Need updated UDS.  Okay to refill Tramadol?

## 2016-10-20 NOTE — Telephone Encounter (Signed)
Patient needs a refill on Tramadol. Patient uses [facility] on [address]. °

## 2016-10-20 NOTE — Telephone Encounter (Signed)
30dnly

## 2016-10-25 ENCOUNTER — Telehealth: Payer: Self-pay

## 2016-10-25 DIAGNOSIS — R350 Frequency of micturition: Secondary | ICD-10-CM | POA: Diagnosis not present

## 2016-10-25 DIAGNOSIS — Z01419 Encounter for gynecological examination (general) (routine) without abnormal findings: Secondary | ICD-10-CM | POA: Diagnosis not present

## 2016-10-25 DIAGNOSIS — R3915 Urgency of urination: Secondary | ICD-10-CM | POA: Diagnosis not present

## 2016-10-25 DIAGNOSIS — Z1231 Encounter for screening mammogram for malignant neoplasm of breast: Secondary | ICD-10-CM | POA: Diagnosis not present

## 2016-10-25 DIAGNOSIS — Z681 Body mass index (BMI) 19 or less, adult: Secondary | ICD-10-CM | POA: Diagnosis not present

## 2016-10-25 NOTE — Telephone Encounter (Signed)
Patient wanted to let you know that Rx for tramadol was sent to the wrong pharmacy.  She would like for Rx to be sent to [facility] on [address].  CB# is [phone].  Please advise.  Thank You.

## 2016-10-25 NOTE — Telephone Encounter (Signed)
Prescription was called into [facility] on [address]. Left message to advise patient.

## 2016-10-27 DIAGNOSIS — M1611 Unilateral primary osteoarthritis, right hip: Secondary | ICD-10-CM | POA: Diagnosis not present

## 2016-11-05 DIAGNOSIS — L219 Seborrheic dermatitis, unspecified: Secondary | ICD-10-CM | POA: Diagnosis not present

## 2016-11-05 DIAGNOSIS — R208 Other disturbances of skin sensation: Secondary | ICD-10-CM | POA: Diagnosis not present

## 2016-11-05 DIAGNOSIS — L609 Nail disorder, unspecified: Secondary | ICD-10-CM | POA: Diagnosis not present

## 2016-11-05 DIAGNOSIS — Z23 Encounter for immunization: Secondary | ICD-10-CM | POA: Diagnosis not present

## 2016-11-05 DIAGNOSIS — L601 Onycholysis: Secondary | ICD-10-CM | POA: Diagnosis not present

## 2016-11-05 DIAGNOSIS — L821 Other seborrheic keratosis: Secondary | ICD-10-CM | POA: Diagnosis not present

## 2016-11-05 DIAGNOSIS — L723 Sebaceous cyst: Secondary | ICD-10-CM | POA: Diagnosis not present

## 2016-11-05 DIAGNOSIS — L738 Other specified follicular disorders: Secondary | ICD-10-CM | POA: Diagnosis not present

## 2016-11-29 ENCOUNTER — Telehealth (INDEPENDENT_AMBULATORY_CARE_PROVIDER_SITE_OTHER): Payer: Self-pay

## 2016-11-29 NOTE — Telephone Encounter (Signed)
Patient would like a referral to [facility] on [address].  Patient has an appointment scheduled for Wednesday 12/08/16 at 2pm.  Cb# is [phone].  Please advise.  Thank You

## 2016-12-08 ENCOUNTER — Telehealth (INDEPENDENT_AMBULATORY_CARE_PROVIDER_SITE_OTHER): Payer: Self-pay

## 2016-12-08 NOTE — Telephone Encounter (Signed)
Patient called concerning a referral for [facility]. Advised patient that she hadn't been seen since 2017 and currently we have to wait for approval from Dr. Estanislado Pandy for referral.  Cb# is [phone].

## 2016-12-09 ENCOUNTER — Telehealth (INDEPENDENT_AMBULATORY_CARE_PROVIDER_SITE_OTHER): Payer: Self-pay | Admitting: Radiology

## 2016-12-09 ENCOUNTER — Other Ambulatory Visit: Payer: Self-pay | Admitting: *Deleted

## 2016-12-09 DIAGNOSIS — M79645 Pain in left finger(s): Secondary | ICD-10-CM

## 2016-12-09 NOTE — Telephone Encounter (Signed)
Referral faxed, [facility] for patient

## 2016-12-09 NOTE — Telephone Encounter (Signed)
Patient called yesterday and LMVM triage that she was at [facility] on [address] to be seen for an appt for a splint. She called at 3:07pm, and I am not sure why our triage person did not check this voice mail yesterday, that was an error on our part.  She had a 245 appt and had requested a referral from Dr Estanislado Pandy.  I see April's note but I cannot tell if it was sent to anyone.  Can someone please call patient to discuss/advise?  She had went to [facility] at [facility] and said that she found the therapist to be flippant, and did not want to see her.  She wants to see Belenda Cruise at [facility] on [address].

## 2016-12-16 ENCOUNTER — Other Ambulatory Visit: Payer: Self-pay | Admitting: *Deleted

## 2016-12-16 NOTE — Telephone Encounter (Signed)
Last Visit: 12/30/15 Next Visit: 12/31/16 UDS: 09/02/15 Narc Agreement: 04/30/16  Attempted to contact the patient and left message for patient. Need updated UDS.  Okay to refill 30 supply Tramadol?

## 2016-12-16 NOTE — Telephone Encounter (Signed)
Need UDS

## 2016-12-21 NOTE — Progress Notes (Deleted)
   Office Visit Note  Patient: Lauren Bradley             Date of Birth: 1950-12-14           MRN: [id number]             PCP: Lavone Orn, MD Referring: Lavone Orn, MD Visit Date: 12/31/2016 Occupation: @GUAROCC @    Subjective:  No chief complaint on file.   History of Present Illness: Lauren Bradley is a 66 y.o. female ***   Activities of Daily Living:  Patient reports morning stiffness for *** {minute/hour:19697}.   Patient {ACTIONS;DENIES/REPORTS:21021675::"Denies"} nocturnal pain.  Difficulty dressing/grooming: {ACTIONS;DENIES/REPORTS:21021675::"Denies"} Difficulty climbing stairs: {ACTIONS;DENIES/REPORTS:21021675::"Denies"} Difficulty getting out of chair: {ACTIONS;DENIES/REPORTS:21021675::"Denies"} Difficulty using hands for taps, buttons, cutlery, and/or writing: {ACTIONS;DENIES/REPORTS:21021675::"Denies"}   No Rheumatology ROS completed.   PMFS History:  Patient Active Problem List   Diagnosis Date Noted  . History of vitamin D deficiency 10/12/2016  . History of seasonal allergies 12/29/2015  . Fibromyalgia 12/29/2015  . Other fatigue 12/29/2015  . Primary osteoarthritis of both hands 12/29/2015  . Primary osteoarthritis of both feet 12/29/2015  . DJD (degenerative joint disease), cervical 12/29/2015  . DDD (degenerative disc disease), lumbar 12/29/2015  . Osteopenia of multiple sites 12/29/2015  . History of migraine 12/29/2015    Past Medical History:  Diagnosis Date  . Asthma   . Fatigue   . Infertility, female     No family history on file. *** The histories are not reviewed yet. Please review them in the "History" navigator section and refresh this [place]. Social History   Social History Narrative  . Not on file     Objective: Vital Signs: There were no vitals taken for this visit.   Physical Exam   Musculoskeletal Exam: ***  CDAI Exam: No CDAI exam completed.    Investigation: No additional findings.  Imaging: No results  found.  Speciality Comments: No specialty comments available.    Procedures:  No procedures performed Allergies: Hydrocodone; Codeine; Sulfa antibiotics; Cephalosporins; and Sulfamethoxazole   Assessment / Plan:     Visit Diagnoses: No diagnosis found.    Orders: No orders of the defined types were placed in this encounter.  No orders of the defined types were placed in this encounter.   Face-to-face time spent with patient was *** minutes. 50% of time was spent in counseling and coordination of care.  Follow-Up Instructions: No Follow-up on file.   Earnestine Mealing, CMA  Note - This record has been created using Editor, commissioning.  Chart creation errors have been sought, but may not always  have been located. Such creation errors do not reflect on  the standard of medical care.

## 2016-12-27 ENCOUNTER — Other Ambulatory Visit: Payer: Self-pay | Admitting: *Deleted

## 2016-12-27 NOTE — Telephone Encounter (Signed)
Refill request received via fax  Last Visit: 12/30/15 Next Visit:04/27/17 UDS: 09/02/15 Narc Agreement: 04/30/16  Patient needs UDS before refill per Dr. Estanislado Pandy. Attempted to contact the patient and left message for patient to call the office.

## 2016-12-29 NOTE — Telephone Encounter (Signed)
Attempted to contact the patient and left message for patient to advise she will need to update labs (UDS) before we can refill medication.

## 2016-12-30 DIAGNOSIS — M25551 Pain in right hip: Secondary | ICD-10-CM | POA: Diagnosis not present

## 2016-12-30 DIAGNOSIS — M545 Low back pain: Secondary | ICD-10-CM | POA: Diagnosis not present

## 2016-12-31 ENCOUNTER — Ambulatory Visit: Payer: Self-pay | Admitting: Rheumatology

## 2017-01-20 ENCOUNTER — Other Ambulatory Visit: Payer: Self-pay | Admitting: Rheumatology

## 2017-01-20 NOTE — Telephone Encounter (Signed)
Patient left a voicemail requesting a refill prescription for Tramadol.  CB# [phone]

## 2017-01-20 NOTE — Telephone Encounter (Signed)
Needs UDS 

## 2017-01-20 NOTE — Telephone Encounter (Signed)
Last Visit: 12/28/15 Next Visit: 04/27/17 UDS: 09/02/15 Narc Agreement: 04/30/16  Attempted to contact the patient and left message for patient. Need updated UDS.  Okay to refill 30 supply Tramadol?

## 2017-01-24 ENCOUNTER — Other Ambulatory Visit: Payer: Self-pay

## 2017-01-24 ENCOUNTER — Other Ambulatory Visit: Payer: Self-pay | Admitting: *Deleted

## 2017-01-24 DIAGNOSIS — Z5181 Encounter for therapeutic drug level monitoring: Secondary | ICD-10-CM | POA: Diagnosis not present

## 2017-01-25 DIAGNOSIS — J384 Edema of larynx: Secondary | ICD-10-CM | POA: Diagnosis not present

## 2017-01-25 DIAGNOSIS — J385 Laryngeal spasm: Secondary | ICD-10-CM | POA: Diagnosis not present

## 2017-01-25 DIAGNOSIS — R490 Dysphonia: Secondary | ICD-10-CM | POA: Diagnosis not present

## 2017-01-25 DIAGNOSIS — R05 Cough: Secondary | ICD-10-CM | POA: Diagnosis not present

## 2017-01-25 DIAGNOSIS — K219 Gastro-esophageal reflux disease without esophagitis: Secondary | ICD-10-CM | POA: Diagnosis not present

## 2017-01-25 DIAGNOSIS — J4520 Mild intermittent asthma, uncomplicated: Secondary | ICD-10-CM | POA: Diagnosis not present

## 2017-01-26 DIAGNOSIS — G43709 Chronic migraine without aura, not intractable, without status migrainosus: Secondary | ICD-10-CM | POA: Diagnosis not present

## 2017-01-28 LAB — PAIN MGMT, PROFILE 5 W/CONF, U
ALPHAHYDROXYALPRAZOLAM: 54 ng/mL — AB (ref <25)
Alphahydroxymidazolam: NEGATIVE ng/mL (ref <50)
Alphahydroxytriazolam: NEGATIVE ng/mL (ref <50)
Aminoclonazepam: NEGATIVE ng/mL (ref <25)
Amphetamines: NEGATIVE ng/mL (ref <500)
BARBITURATES: NEGATIVE ng/mL (ref <300)
Benzodiazepines: POSITIVE ng/mL — AB (ref <100)
CREATININE: 15 mg/dL — AB
Cocaine Metabolite: NEGATIVE ng/mL (ref <150)
HYDROXYETHYLFLURAZEPAM: NEGATIVE ng/mL (ref <50)
LORAZEPAM: NEGATIVE ng/mL (ref <50)
MARIJUANA METABOLITE: NEGATIVE ng/mL (ref <20)
Methadone Metabolite: NEGATIVE ng/mL (ref <100)
Nordiazepam: NEGATIVE ng/mL (ref <50)
OXIDANT: NEGATIVE ug/mL (ref <200)
OXYCODONE: NEGATIVE ng/mL (ref <100)
Opiates: NEGATIVE ng/mL (ref <100)
Oxazepam: NEGATIVE ng/mL (ref <50)
PH: 7.12 (ref 4.5–9.0)
SPECIFIC GRAVITY: 1.008 (ref >=1.0)
Temazepam: NEGATIVE ng/mL (ref <50)

## 2017-01-28 LAB — PAIN MGMT, TRAMADOL W/MEDMATCH, U
Desmethyltramadol: 224 ng/mL — ABNORMAL HIGH (ref <100)
TRAMADOL: 339 ng/mL — AB (ref <100)

## 2017-01-28 NOTE — Progress Notes (Signed)
C/w

## 2017-01-31 DIAGNOSIS — Z961 Presence of intraocular lens: Secondary | ICD-10-CM | POA: Diagnosis not present

## 2017-02-04 ENCOUNTER — Other Ambulatory Visit: Payer: Self-pay | Admitting: *Deleted

## 2017-02-04 MED ORDER — TRAMADOL HCL 50 MG PO TABS: 50.0000 mg | ORAL_TABLET | Freq: Three times a day (TID) | ORAL | 0 refills | Status: DC | PRN

## 2017-02-04 NOTE — Telephone Encounter (Signed)
Refill request received via fax.   Last Visit: 12/28/15 Next Visit: 04/27/17 UDS: 01/24/17 Narc Agreement: 04/30/16  Okay to refill Tramadol?

## 2017-02-07 MED ORDER — TRAMADOL HCL 50 MG PO TABS: 50.0000 mg | ORAL_TABLET | Freq: Three times a day (TID) | ORAL | 0 refills | Status: DC | PRN

## 2017-02-12 DIAGNOSIS — M79642 Pain in left hand: Secondary | ICD-10-CM | POA: Diagnosis not present

## 2017-02-18 NOTE — Progress Notes (Signed)
Office Visit Note  Patient: Lauren Bradley             Date of Birth: [birth year]           MRN: [id number]             PCP: Lavone Orn, MD Referring: Lavone Orn, MD Visit Date: 02/21/2017 Occupation: @GUAROCC @    Subjective:  Hand pain    History of Present Illness: Lauren Bradley is a 67 y.o. female with history of fibromyalgia, osteoarthritis, and DDD.  Patient states she is having increased pain in her bilateral hands.  She has intermittent swelling as swelling. She has significant joint stiffness.  She has chronic pain in bilateral CMC joints.  She sees Dr. [name].  She is following up with him in 2 weeks.  She reports she needs a CMC joint replacement but she is trying to hold off.  She reports she injured her left index finger while scraping snow December 17, 2016.  She states the pain continues to worsen.  She went to hand therapy today and as put in a buddy brace today.  She reports the pain is a 8 out of 10.  She has increased muscle tension and tenderness between shoulder blades and trapezius region.  She states she has been on a anti-inflammatory diet for the past 2 years.  She continues to have chronic pain in her feet.  She wears orthotics in her right shoe and a brace on her left foot/ankle.  She states the pain is worse in the left.  She reports she has not been exercising as much as usual.     Activities of Daily Living:  Patient reports morning stiffness for 2-3 hours.   Patient Reports nocturnal pain.  Difficulty dressing/grooming: Denies Difficulty climbing stairs: Reports Difficulty getting out of chair: Reports Difficulty using hands for taps, buttons, cutlery, and/or writing: Reports   Review of Systems  Constitutional: Positive for fatigue. Negative for weakness.  HENT: Positive for mouth dryness. Negative for mouth sores and nose dryness.   Eyes: Positive for dryness (Uses drops BID). Negative for pain, redness and visual disturbance.  Respiratory:  Positive for cough. Negative for hemoptysis, shortness of breath and difficulty breathing.   Cardiovascular: Negative for chest pain, palpitations, hypertension, irregular heartbeat and swelling in legs/feet.  Gastrointestinal: Negative for blood in stool, constipation and diarrhea.  Endocrine: Negative for increased urination.  Genitourinary: Negative for painful urination.  Musculoskeletal: Positive for arthralgias, joint pain, joint swelling, morning stiffness and muscle tenderness. Negative for myalgias, muscle weakness and myalgias.  Skin: Negative for color change, pallor, rash, hair loss, nodules/bumps, redness, skin tightness, ulcers and sensitivity to sunlight.  Allergic/Immunologic: Negative for susceptible to infections.  Neurological: Positive for headaches (Migraines-well controlled with Botox). Negative for dizziness and numbness.  Hematological: Negative for swollen glands.  Psychiatric/Behavioral: Negative for depressed mood and sleep disturbance. The patient is not nervous/anxious.     PMFS History:  Patient Active Problem List   Diagnosis Date Noted  . History of vitamin D deficiency 10/12/2016  . History of seasonal allergies 12/29/2015  . Fibromyalgia 12/29/2015  . Other fatigue 12/29/2015  . Primary osteoarthritis of both hands 12/29/2015  . Primary osteoarthritis of both feet 12/29/2015  . DJD (degenerative joint disease), cervical 12/29/2015  . DDD (degenerative disc disease), lumbar 12/29/2015  . Osteopenia of multiple sites 12/29/2015  . History of migraine 12/29/2015    Past Medical History:  Diagnosis Date  . Asthma   .  Fatigue   . Infertility, female     Family History  Problem Relation Age of Onset  . Fibromyalgia Mother   . Kidney failure Mother   . Mental illness Mother   . Heart disease Father   . COPD Father   . Cancer Father        colon, liver   . Hypothyroidism Son    Past Surgical History:  Procedure Laterality Date  . CESAREAN  SECTION    . GALLBLADDER SURGERY     Social History   Social History Narrative  . Not on file     Objective: Vital Signs: BP (!) 141/82 (BP Location: Left Arm, Patient Position: Sitting, Cuff Size: Normal)   Pulse 99   Resp 14   Ht 5\' 3"  (1.6 m)   Wt 100 lb 8 oz (45.6 kg)   BMI 17.80 kg/m    Physical Exam  Constitutional: She is oriented to person, place, and time. She appears well-developed and well-nourished.  HENT:  Head: Normocephalic and atraumatic.  Eyes: Conjunctivae and EOM are normal.  Neck: Normal range of motion.  Cardiovascular: Normal rate, regular rhythm, normal heart sounds and intact distal pulses.  Pulmonary/Chest: Effort normal and breath sounds normal.  Abdominal: Soft. Bowel sounds are normal.  Lymphadenopathy:    She has no cervical adenopathy.  Neurological: She is alert and oriented to person, place, and time.  Skin: Skin is warm and dry. Capillary refill takes less than 2 seconds.  Psychiatric: She has a normal mood and affect. Her behavior is normal.  Nursing note and vitals reviewed.    Musculoskeletal Exam: C-spine limited ROM with lateral rotation.  Thoracic and lumbar spine good ROM.  No midline spinal tenderness.  SI joint tenderness.  Shoulder joints, elbows, wrist joints, MCPs, PIPs, and DIPs good ROM with no synovitis.  She has tenderness of all MCPs.  She has tenderness of bilateral CMC joints.  Hip joints, knee joints good ROM.  No warmth or effusion of knees.  She has bilateral knee crepitus. She has very limited ROM of left ankle.  She wears a brace. She has bilateral tenderness of trochanteric bursa.    CDAI Exam: No CDAI exam completed.    Investigation: No additional findings. CBC Latest Ref Rng & Units 12/30/2015 12/11/2008 12/10/2008  WBC 3.8 - 10.8 K/uL 6.7 - 13.9(H)  Hemoglobin 11.7 - 15.5 g/dL 12.9 12.6 12.6  Hematocrit 35.0 - 45.0 % 38.8 37.0 37.8  Platelets 140 - 400 K/uL 326 - 679(H)   CMP Latest Ref Rng & Units  12/30/2015 12/11/2008 12/10/2008  Glucose 65 - 99 mg/dL 96 127(H) 175(H)  BUN 7 - 25 mg/dL 14 8 10   Creatinine 0.50 - 0.99 mg/dL 0.71 0.5 0.56  Sodium 135 - 146 mmol/L 139 133(L) 128(L)  Potassium 3.5 - 5.3 mmol/L 4.4 3.9 3.5  Chloride 98 - 110 mmol/L 102 96 92(L)  CO2 20 - 31 mmol/L 29 - 29  Calcium 8.6 - 10.4 mg/dL 9.5 - 9.3  Total Protein 6.1 - 8.1 g/dL 6.8 - 7.4  Total Bilirubin 0.2 - 1.2 mg/dL 0.5 - 0.5  Alkaline Phos 33 - 130 U/L 28(L) - 83  AST 10 - 35 U/L 20 - 22  ALT 6 - 29 U/L 21 - 44(H)    Imaging: No results found.  Speciality Comments: No specialty comments available.    Procedures:  No procedures performed Allergies: Hydrocodone; Codeine; Other; Sulfa antibiotics; Cephalosporins; and Sulfamethoxazole   Assessment / Plan:  Visit Diagnoses: Fibromyalgia - She has been having a fibromyalgia for the past week.  She is having increased muscle tenderness and muscle tension in the trapezius region.  She takes Cymbalta 90 mg daily and tramadol PRN for pain relief.  She has chronic fatigue and insomnia.  She has not been exercising as regularly as normal.  We discussed the importance of exercise and good sleep hygiene.    Medication monitoring encounter - Tramadol 50 mg TID PRN, UDS 01/24/17, narcotic agreement: 01/24/17-CBC and CMP were drawn today. - Plan: CBC with Differential/Platelet, COMPLETE METABOLIC PANEL WITH GFR  Pain in both hands -She has tenderness of all MCPs.  She reports joint swelling intermittent and significant morning stiffness in the hands.  She has questionable synovitis of her right 2nd MCP. RF, CCP, and 14-3-3 eta was ordered today.  She was also scheduled for an ultrasound of her hands.  Plan: 14-3-3 eta Protein, Cyclic citrul peptide antibody, IgG, Rheumatoid factor, Sedimentation rate   Primary osteoarthritis of both hands: She has decreased grip strength.  She has bilateral CMC joint pain.  She sees Dr. [name].  Per patient she needs bilateral CMC  joint replacements, but she does not want to proceed with a replacement at this time. Joint protection and muscle strengthening were discussed.   Primary osteoarthritis of both feet: She has discomfort in bilateral feet. She wears supportive shoes and orthotics.    DDD (degenerative disc disease), cervical: She has c-spine limited ROM with lateral rotation.  She has trapezius muscle tension and muscle tenderness.    DDD (degenerative disc disease), lumbar: She has chronic lower back pain.  No midline spinal tenderness.    Osteopenia of multiple sites: She takes vitamin D and calcium supplement daily.   Other fatigue: Chronic and related to insomnia.  Advised patient to start exercising more on a regular basis.    History of migraine: Well controlled.  She has botox performed on a regular basis.   History of vitamin D deficiency: She takes vitamin D supplements daily.      Orders: Orders Placed This Encounter  Procedures  . 14-3-3 eta Protein  . Cyclic citrul peptide antibody, IgG  . Rheumatoid factor  . Sedimentation rate  . CBC with Differential/Platelet  . COMPLETE METABOLIC PANEL WITH GFR   No orders of the defined types were placed in this encounter.   Face-to-face time spent with patient was 45 minutes. Greater than 50% of time was spent in counseling and coordination of care.  Follow-Up Instructions: Return in about 6 months (around 08/21/2017) for Fibromyalgia, Osteoarthritis, DDD.   Ofilia Neas, PA-C  Note - This record has been created using Dragon software.  Chart creation errors have been sought, but may not always  have been located. Such creation errors do not reflect on  the standard of medical care.

## 2017-02-21 ENCOUNTER — Ambulatory Visit (INDEPENDENT_AMBULATORY_CARE_PROVIDER_SITE_OTHER): Payer: Medicare Other | Admitting: Physician Assistant

## 2017-02-21 ENCOUNTER — Encounter (INDEPENDENT_AMBULATORY_CARE_PROVIDER_SITE_OTHER): Payer: Self-pay

## 2017-02-21 ENCOUNTER — Encounter: Payer: Self-pay | Admitting: Physician Assistant

## 2017-02-21 VITALS — BP 141/82 | HR 99 | Resp 14 | Ht 63.0 in | Wt 100.5 lb

## 2017-02-21 DIAGNOSIS — M5030 Other cervical disc degeneration, unspecified cervical region: Secondary | ICD-10-CM | POA: Diagnosis not present

## 2017-02-21 DIAGNOSIS — Z8669 Personal history of other diseases of the nervous system and sense organs: Secondary | ICD-10-CM

## 2017-02-21 DIAGNOSIS — M19072 Primary osteoarthritis, left ankle and foot: Secondary | ICD-10-CM

## 2017-02-21 DIAGNOSIS — M5136 Other intervertebral disc degeneration, lumbar region: Secondary | ICD-10-CM | POA: Diagnosis not present

## 2017-02-21 DIAGNOSIS — M19042 Primary osteoarthritis, left hand: Secondary | ICD-10-CM

## 2017-02-21 DIAGNOSIS — M19041 Primary osteoarthritis, right hand: Secondary | ICD-10-CM | POA: Diagnosis not present

## 2017-02-21 DIAGNOSIS — M79642 Pain in left hand: Secondary | ICD-10-CM

## 2017-02-21 DIAGNOSIS — Z8639 Personal history of other endocrine, nutritional and metabolic disease: Secondary | ICD-10-CM | POA: Diagnosis not present

## 2017-02-21 DIAGNOSIS — R5383 Other fatigue: Secondary | ICD-10-CM

## 2017-02-21 DIAGNOSIS — M19071 Primary osteoarthritis, right ankle and foot: Secondary | ICD-10-CM | POA: Diagnosis not present

## 2017-02-21 DIAGNOSIS — M8589 Other specified disorders of bone density and structure, multiple sites: Secondary | ICD-10-CM | POA: Diagnosis not present

## 2017-02-21 DIAGNOSIS — M79641 Pain in right hand: Secondary | ICD-10-CM

## 2017-02-21 DIAGNOSIS — M797 Fibromyalgia: Secondary | ICD-10-CM

## 2017-02-21 DIAGNOSIS — Z5181 Encounter for therapeutic drug level monitoring: Secondary | ICD-10-CM

## 2017-02-24 LAB — COMPLETE METABOLIC PANEL WITH GFR
AG Ratio: 1.8 (calc) (ref 1.0–2.5)
ALT: 20 U/L (ref 6–29)
AST: 22 U/L (ref 10–35)
Albumin: 4.4 g/dL (ref 3.6–5.1)
Alkaline phosphatase (APISO): 33 U/L (ref 33–130)
BUN: 16 mg/dL (ref 7–25)
CALCIUM: 9.4 mg/dL (ref 8.6–10.4)
CO2: 29 mmol/L (ref 20–32)
CREATININE: 0.58 mg/dL (ref 0.50–0.99)
Chloride: 102 mmol/L (ref 98–110)
GFR, EST NON AFRICAN AMERICAN: 97 mL/min/{1.73_m2} (ref >=60)
GFR, Est African American: 112 mL/min/{1.73_m2} (ref >=60)
GLUCOSE: 88 mg/dL (ref 65–99)
Globulin: 2.4 g/dL (calc) (ref 1.9–3.7)
Potassium: 4.3 mmol/L (ref 3.5–5.3)
Sodium: 137 mmol/L (ref 135–146)
Total Bilirubin: 0.3 mg/dL (ref 0.2–1.2)
Total Protein: 6.8 g/dL (ref 6.1–8.1)

## 2017-02-24 LAB — CBC WITH DIFFERENTIAL/PLATELET
BASOS ABS: 73 {cells}/uL (ref 0–200)
Basophils Relative: 1.1 %
EOS ABS: 132 {cells}/uL (ref 15–500)
Eosinophils Relative: 2 %
HEMATOCRIT: 38.2 % (ref 35.0–45.0)
Hemoglobin: 12.9 g/dL (ref 11.7–15.5)
LYMPHS ABS: 1228 {cells}/uL (ref 850–3900)
MCH: 29.6 pg (ref 27.0–33.0)
MCHC: 33.8 g/dL (ref 32.0–36.0)
MCV: 87.6 fL (ref 80.0–100.0)
MPV: 9.8 fL (ref 7.5–12.5)
Monocytes Relative: 6.9 %
NEUTROS PCT: 71.4 %
Neutro Abs: 4712 cells/uL (ref 1500–7800)
PLATELETS: 327 10*3/uL (ref 140–400)
RBC: 4.36 10*6/uL (ref 3.80–5.10)
RDW: 12.4 % (ref 11.0–15.0)
Total Lymphocyte: 18.6 %
WBC: 6.6 10*3/uL (ref 3.8–10.8)
WBCMIX: 455 {cells}/uL (ref 200–950)

## 2017-02-24 LAB — 14-3-3 ETA PROTEIN

## 2017-02-24 LAB — RHEUMATOID FACTOR: Rhuematoid fact SerPl-aCnc: <14 IU/mL (ref <14)

## 2017-02-24 LAB — CYCLIC CITRUL PEPTIDE ANTIBODY, IGG: Cyclic Citrullin Peptide Ab: <16 UNITS

## 2017-02-24 LAB — SEDIMENTATION RATE: Sed Rate: 6 mm/h (ref 0–30)

## 2017-02-25 NOTE — Progress Notes (Signed)
All labs are WNL.  RA labs normal.

## 2017-02-28 DIAGNOSIS — M79642 Pain in left hand: Secondary | ICD-10-CM | POA: Diagnosis not present

## 2017-03-01 DIAGNOSIS — E559 Vitamin D deficiency, unspecified: Secondary | ICD-10-CM | POA: Diagnosis not present

## 2017-03-01 DIAGNOSIS — D509 Iron deficiency anemia, unspecified: Secondary | ICD-10-CM | POA: Diagnosis not present

## 2017-03-01 DIAGNOSIS — K219 Gastro-esophageal reflux disease without esophagitis: Secondary | ICD-10-CM | POA: Diagnosis not present

## 2017-03-01 DIAGNOSIS — G43709 Chronic migraine without aura, not intractable, without status migrainosus: Secondary | ICD-10-CM | POA: Diagnosis not present

## 2017-03-01 DIAGNOSIS — M797 Fibromyalgia: Secondary | ICD-10-CM | POA: Diagnosis not present

## 2017-03-01 DIAGNOSIS — M159 Polyosteoarthritis, unspecified: Secondary | ICD-10-CM | POA: Diagnosis not present

## 2017-03-01 DIAGNOSIS — E039 Hypothyroidism, unspecified: Secondary | ICD-10-CM | POA: Diagnosis not present

## 2017-03-01 DIAGNOSIS — I10 Essential (primary) hypertension: Secondary | ICD-10-CM | POA: Diagnosis not present

## 2017-03-01 DIAGNOSIS — M5136 Other intervertebral disc degeneration, lumbar region: Secondary | ICD-10-CM | POA: Diagnosis not present

## 2017-03-01 DIAGNOSIS — Z800 Family history of malignant neoplasm of digestive organs: Secondary | ICD-10-CM | POA: Diagnosis not present

## 2017-03-10 DIAGNOSIS — M79645 Pain in left finger(s): Secondary | ICD-10-CM | POA: Diagnosis not present

## 2017-03-11 ENCOUNTER — Other Ambulatory Visit: Payer: Self-pay | Admitting: Physician Assistant

## 2017-03-16 DIAGNOSIS — M79642 Pain in left hand: Secondary | ICD-10-CM | POA: Diagnosis not present

## 2017-03-16 DIAGNOSIS — M65849 Other synovitis and tenosynovitis, unspecified hand: Secondary | ICD-10-CM | POA: Diagnosis not present

## 2017-03-16 DIAGNOSIS — M65842 Other synovitis and tenosynovitis, left hand: Secondary | ICD-10-CM | POA: Diagnosis not present

## 2017-03-16 NOTE — Telephone Encounter (Signed)
Last Visit: 02/21/17 Next Visit:08/23/17 UDS: 01/24/17 Narc Agreement: 04/30/16  Okay to refill Tramadol?

## 2017-03-23 ENCOUNTER — Other Ambulatory Visit: Payer: Medicare Other | Admitting: Rheumatology

## 2017-03-24 DIAGNOSIS — F322 Major depressive disorder, single episode, severe without psychotic features: Secondary | ICD-10-CM | POA: Diagnosis not present

## 2017-03-24 DIAGNOSIS — R490 Dysphonia: Secondary | ICD-10-CM | POA: Diagnosis not present

## 2017-03-24 DIAGNOSIS — R05 Cough: Secondary | ICD-10-CM | POA: Diagnosis not present

## 2017-03-24 DIAGNOSIS — J385 Laryngeal spasm: Secondary | ICD-10-CM | POA: Diagnosis not present

## 2017-04-13 ENCOUNTER — Ambulatory Visit (INDEPENDENT_AMBULATORY_CARE_PROVIDER_SITE_OTHER): Payer: Medicare Other | Admitting: Rheumatology

## 2017-04-13 ENCOUNTER — Ambulatory Visit (INDEPENDENT_AMBULATORY_CARE_PROVIDER_SITE_OTHER): Payer: Self-pay

## 2017-04-13 DIAGNOSIS — M79642 Pain in left hand: Secondary | ICD-10-CM | POA: Diagnosis not present

## 2017-04-13 DIAGNOSIS — M79641 Pain in right hand: Secondary | ICD-10-CM

## 2017-04-13 NOTE — Patient Instructions (Signed)

## 2017-04-13 NOTE — Progress Notes (Signed)
February 21, 1998 1912-03-13 eta negative, anti-CCP negative, RF negative, ESR 6

## 2017-04-21 DIAGNOSIS — G43709 Chronic migraine without aura, not intractable, without status migrainosus: Secondary | ICD-10-CM | POA: Diagnosis not present

## 2017-04-27 ENCOUNTER — Ambulatory Visit: Payer: Self-pay | Admitting: Rheumatology

## 2017-05-09 DIAGNOSIS — H18413 Arcus senilis, bilateral: Secondary | ICD-10-CM | POA: Diagnosis not present

## 2017-05-09 DIAGNOSIS — Z961 Presence of intraocular lens: Secondary | ICD-10-CM | POA: Diagnosis not present

## 2017-05-11 DIAGNOSIS — J385 Laryngeal spasm: Secondary | ICD-10-CM | POA: Diagnosis not present

## 2017-05-11 DIAGNOSIS — R490 Dysphonia: Secondary | ICD-10-CM | POA: Diagnosis not present

## 2017-05-11 DIAGNOSIS — R05 Cough: Secondary | ICD-10-CM | POA: Diagnosis not present

## 2017-05-27 ENCOUNTER — Other Ambulatory Visit: Payer: Self-pay | Admitting: Physician Assistant

## 2017-05-30 NOTE — Telephone Encounter (Signed)
Last Visit: 02/21/17 Next Visit:08/23/17 UDS:01/24/17 Narc Agreement: 01/24/17  Okay to refill Tramadol?

## 2017-06-08 DIAGNOSIS — M13842 Other specified arthritis, left hand: Secondary | ICD-10-CM | POA: Diagnosis not present

## 2017-06-08 DIAGNOSIS — M79642 Pain in left hand: Secondary | ICD-10-CM | POA: Diagnosis not present

## 2017-06-08 DIAGNOSIS — M67844 Other specified disorders of tendon, left hand: Secondary | ICD-10-CM | POA: Diagnosis not present

## 2017-07-18 DIAGNOSIS — G43709 Chronic migraine without aura, not intractable, without status migrainosus: Secondary | ICD-10-CM | POA: Diagnosis not present

## 2017-08-04 DIAGNOSIS — F322 Major depressive disorder, single episode, severe without psychotic features: Secondary | ICD-10-CM | POA: Diagnosis not present

## 2017-08-09 NOTE — Progress Notes (Deleted)
Office Visit Note  Patient: Lauren Bradley             Date of Birth: 05-03-1950           MRN: [id number]             PCP: Lavone Orn, MD Referring: Lavone Orn, MD Visit Date: 08/23/2017 Occupation: @GUAROCC @  Subjective:  No chief complaint on file.   History of Present Illness: Lauren Bradley is a 67 y.o. female ***   Activities of Daily Living:  Patient reports morning stiffness for *** {minute/hour:19697}.   Patient {ACTIONS;DENIES/REPORTS:21021675::"Denies"} nocturnal pain.  Difficulty dressing/grooming: {ACTIONS;DENIES/REPORTS:21021675::"Denies"} Difficulty climbing stairs: {ACTIONS;DENIES/REPORTS:21021675::"Denies"} Difficulty getting out of chair: {ACTIONS;DENIES/REPORTS:21021675::"Denies"} Difficulty using hands for taps, buttons, cutlery, and/or writing: {ACTIONS;DENIES/REPORTS:21021675::"Denies"}  No Rheumatology ROS completed.   PMFS History:  Patient Active Problem List   Diagnosis Date Noted  . History of vitamin D deficiency 10/12/2016  . History of seasonal allergies 12/29/2015  . Fibromyalgia 12/29/2015  . Other fatigue 12/29/2015  . Primary osteoarthritis of both hands 12/29/2015  . Primary osteoarthritis of both feet 12/29/2015  . DJD (degenerative joint disease), cervical 12/29/2015  . DDD (degenerative disc disease), lumbar 12/29/2015  . Osteopenia of multiple sites 12/29/2015  . History of migraine 12/29/2015    Past Medical History:  Diagnosis Date  . Asthma   . Fatigue   . Infertility, female     Family History  Problem Relation Age of Onset  . Fibromyalgia Mother   . Kidney failure Mother   . Mental illness Mother   . Heart disease Father   . COPD Father   . Cancer Father        colon, liver   . Hypothyroidism Son    Past Surgical History:  Procedure Laterality Date  . CESAREAN SECTION    . GALLBLADDER SURGERY     Social History   Social History Narrative  . Not on file    Objective: Vital Signs: There were no  vitals taken for this visit.   Physical Exam   Musculoskeletal Exam: ***  CDAI Exam: No CDAI exam completed.   Investigation: No additional findings.  Imaging: No results found.  Recent Labs: Lab Results  Component Value Date   WBC 6.6 02/21/2017   HGB 12.9 02/21/2017   PLT 327 02/21/2017   NA 137 02/21/2017   K 4.3 02/21/2017   CL 102 02/21/2017   CO2 29 02/21/2017   GLUCOSE 88 02/21/2017   BUN 16 02/21/2017   CREATININE 0.58 02/21/2017   BILITOT 0.3 02/21/2017   ALKPHOS 28 (L) 12/30/2015   AST 22 02/21/2017   ALT 20 02/21/2017   PROT 6.8 02/21/2017   ALBUMIN 4.4 12/30/2015   CALCIUM 9.4 02/21/2017   GFRAA 112 02/21/2017    Speciality Comments: No specialty comments available.  Procedures:  No procedures performed Allergies: Hydrocodone; Codeine; Other; Sulfa antibiotics; Cephalosporins; and Sulfamethoxazole   Assessment / Plan:     Visit Diagnoses: No diagnosis found.   Orders: No orders of the defined types were placed in this encounter.  No orders of the defined types were placed in this encounter.   Face-to-face time spent with patient was *** minutes. Greater than 50% of time was spent in counseling and coordination of care.  Follow-Up Instructions: No follow-ups on file.   Earnestine Mealing, CMA  Note - This record has been created using Editor, commissioning.  Chart creation errors have been sought, but may not always  have been located.  Such creation errors do not reflect on  the standard of medical care.

## 2017-08-11 ENCOUNTER — Other Ambulatory Visit: Payer: Self-pay | Admitting: Physician Assistant

## 2017-08-11 NOTE — Telephone Encounter (Signed)
Last visit: 02/21/2017 Next visit: 08/23/2017 UDS: 01/24/2017 c/w Narc agreement: 01/24/2017  Left message on machine for patient to call the office. Patient needs to update UDS/narc agreement.

## 2017-08-11 NOTE — Telephone Encounter (Signed)
Please notify me when she has updated UDS and narcotic agreement.

## 2017-08-12 ENCOUNTER — Other Ambulatory Visit: Payer: Self-pay

## 2017-08-12 DIAGNOSIS — Z5181 Encounter for therapeutic drug level monitoring: Secondary | ICD-10-CM

## 2017-08-19 DIAGNOSIS — K5900 Constipation, unspecified: Secondary | ICD-10-CM | POA: Diagnosis not present

## 2017-08-19 DIAGNOSIS — M152 Bouchard's nodes (with arthropathy): Secondary | ICD-10-CM | POA: Diagnosis not present

## 2017-08-19 DIAGNOSIS — Z800 Family history of malignant neoplasm of digestive organs: Secondary | ICD-10-CM | POA: Diagnosis not present

## 2017-08-19 DIAGNOSIS — I10 Essential (primary) hypertension: Secondary | ICD-10-CM | POA: Diagnosis not present

## 2017-08-19 DIAGNOSIS — Z0000 Encounter for general adult medical examination without abnormal findings: Secondary | ICD-10-CM | POA: Diagnosis not present

## 2017-08-19 DIAGNOSIS — K589 Irritable bowel syndrome without diarrhea: Secondary | ICD-10-CM | POA: Diagnosis not present

## 2017-08-19 DIAGNOSIS — J4520 Mild intermittent asthma, uncomplicated: Secondary | ICD-10-CM | POA: Diagnosis not present

## 2017-08-19 DIAGNOSIS — D509 Iron deficiency anemia, unspecified: Secondary | ICD-10-CM | POA: Diagnosis not present

## 2017-08-19 DIAGNOSIS — K219 Gastro-esophageal reflux disease without esophagitis: Secondary | ICD-10-CM | POA: Diagnosis not present

## 2017-08-23 ENCOUNTER — Ambulatory Visit: Payer: Medicare Other | Admitting: Rheumatology

## 2017-08-25 DIAGNOSIS — M779 Enthesopathy, unspecified: Secondary | ICD-10-CM | POA: Diagnosis not present

## 2017-08-25 DIAGNOSIS — M2042 Other hammer toe(s) (acquired), left foot: Secondary | ICD-10-CM | POA: Diagnosis not present

## 2017-08-25 DIAGNOSIS — I10 Essential (primary) hypertension: Secondary | ICD-10-CM | POA: Diagnosis not present

## 2017-08-25 DIAGNOSIS — M25372 Other instability, left ankle: Secondary | ICD-10-CM | POA: Diagnosis not present

## 2017-08-31 NOTE — Progress Notes (Deleted)
Office Visit Note  Patient: Lauren Bradley             Date of Birth: Jun 20, 1950           MRN: [id number]             PCP: Lauren Orn, MD Referring: Lauren Orn, MD Visit Date: 09/13/2017 Occupation: @GUAROCC @  Subjective:  No chief complaint on file.   History of Present Illness: Lauren Bradley is a 67 y.o. female ***   Activities of Daily Living:  Patient reports morning stiffness for *** {minute/hour:19697}.   Patient {ACTIONS;DENIES/REPORTS:21021675::"Denies"} nocturnal pain.  Difficulty dressing/grooming: {ACTIONS;DENIES/REPORTS:21021675::"Denies"} Difficulty climbing stairs: {ACTIONS;DENIES/REPORTS:21021675::"Denies"} Difficulty getting out of chair: {ACTIONS;DENIES/REPORTS:21021675::"Denies"} Difficulty using hands for taps, buttons, cutlery, and/or writing: {ACTIONS;DENIES/REPORTS:21021675::"Denies"}  No Rheumatology ROS completed.   PMFS History:  Patient Active Problem List   Diagnosis Date Noted  . History of vitamin D deficiency 10/12/2016  . History of seasonal allergies 12/29/2015  . Fibromyalgia 12/29/2015  . Other fatigue 12/29/2015  . Primary osteoarthritis of both hands 12/29/2015  . Primary osteoarthritis of both feet 12/29/2015  . DJD (degenerative joint disease), cervical 12/29/2015  . DDD (degenerative disc disease), lumbar 12/29/2015  . Osteopenia of multiple sites 12/29/2015  . History of migraine 12/29/2015    Past Medical History:  Diagnosis Date  . Asthma   . Fatigue   . Infertility, female     Family History  Problem Relation Age of Onset  . Fibromyalgia Mother   . Kidney failure Mother   . Mental illness Mother   . Heart disease Father   . COPD Father   . Cancer Father        colon, liver   . Hypothyroidism Son    Past Surgical History:  Procedure Laterality Date  . CESAREAN SECTION    . GALLBLADDER SURGERY     Social History   Social History Narrative  . Not on file    Objective: Vital Signs: There were no  vitals taken for this visit.   Physical Exam   Musculoskeletal Exam: ***  CDAI Exam: CDAI Score: Not documented Patient Global Assessment: Not documented; Provider Global Assessment: Not documented Swollen: Not documented; Tender: Not documented Joint Exam   Not documented   There is currently no information documented on the homunculus. Go to the Rheumatology activity and complete the homunculus joint exam.  Investigation: No additional findings.  Imaging: No results found.  Recent Labs: Lab Results  Component Value Date   WBC 6.6 02/21/2017   HGB 12.9 02/21/2017   PLT 327 02/21/2017   NA 137 02/21/2017   K 4.3 02/21/2017   CL 102 02/21/2017   CO2 29 02/21/2017   GLUCOSE 88 02/21/2017   BUN 16 02/21/2017   CREATININE 0.58 02/21/2017   BILITOT 0.3 02/21/2017   ALKPHOS 28 (L) 12/30/2015   AST 22 02/21/2017   ALT 20 02/21/2017   PROT 6.8 02/21/2017   ALBUMIN 4.4 12/30/2015   CALCIUM 9.4 02/21/2017   GFRAA 112 02/21/2017    Speciality Comments: No specialty comments available.  Procedures:  No procedures performed Allergies: Hydrocodone; Codeine; Other; Sulfa antibiotics; Cephalosporins; and Sulfamethoxazole   Assessment / Plan:     Visit Diagnoses: No diagnosis found.   Orders: No orders of the defined types were placed in this encounter.  No orders of the defined types were placed in this encounter.   Face-to-face time spent with patient was *** minutes. Greater than 50% of time was spent in counseling  and coordination of care.  Follow-Up Instructions: No follow-ups on file.   Earnestine Mealing, CMA  Note - This record has been created using Editor, commissioning.  Chart creation errors have been sought, but may not always  have been located. Such creation errors do not reflect on  the standard of medical care.

## 2017-09-13 ENCOUNTER — Ambulatory Visit: Payer: Medicare Other | Admitting: Rheumatology

## 2017-09-19 DIAGNOSIS — M1811 Unilateral primary osteoarthritis of first carpometacarpal joint, right hand: Secondary | ICD-10-CM | POA: Diagnosis not present

## 2017-09-19 DIAGNOSIS — M79642 Pain in left hand: Secondary | ICD-10-CM | POA: Diagnosis not present

## 2017-09-19 DIAGNOSIS — M1812 Unilateral primary osteoarthritis of first carpometacarpal joint, left hand: Secondary | ICD-10-CM | POA: Diagnosis not present

## 2017-09-19 DIAGNOSIS — M79641 Pain in right hand: Secondary | ICD-10-CM | POA: Diagnosis not present

## 2017-09-23 ENCOUNTER — Telehealth: Payer: Self-pay | Admitting: Rheumatology

## 2017-09-23 NOTE — Telephone Encounter (Signed)
Patient called requesting the UDS & Narcotic Agreement be faxed to her private fax machine so she can sign it and send it back.  Fax [phone]   Patient states her fax and phone are the same number.

## 2017-09-23 NOTE — Telephone Encounter (Signed)
Patient advised that she is due a UDS so it would be easier if she comes to the office for UDS and to sign narcotic agreement. Patient will come 08/26/17.

## 2017-09-29 NOTE — Progress Notes (Deleted)
Office Visit Note  Patient: Lauren Bradley             Date of Birth: Apr 19, 1950           MRN: [id number]             PCP: Lavone Orn, MD Referring: Lavone Orn, MD Visit Date: 10/13/2017 Occupation: @GUAROCC @  Subjective:  No chief complaint on file.   History of Present Illness: Lauren Bradley is a 67 y.o. female ***   Activities of Daily Living:  Patient reports morning stiffness for *** {minute/hour:19697}.   Patient {ACTIONS;DENIES/REPORTS:21021675::"Denies"} nocturnal pain.  Difficulty dressing/grooming: {ACTIONS;DENIES/REPORTS:21021675::"Denies"} Difficulty climbing stairs: {ACTIONS;DENIES/REPORTS:21021675::"Denies"} Difficulty getting out of chair: {ACTIONS;DENIES/REPORTS:21021675::"Denies"} Difficulty using hands for taps, buttons, cutlery, and/or writing: {ACTIONS;DENIES/REPORTS:21021675::"Denies"}  No Rheumatology ROS completed.   PMFS History:  Patient Active Problem List   Diagnosis Date Noted  . History of vitamin D deficiency 10/12/2016  . History of seasonal allergies 12/29/2015  . Fibromyalgia 12/29/2015  . Other fatigue 12/29/2015  . Primary osteoarthritis of both hands 12/29/2015  . Primary osteoarthritis of both feet 12/29/2015  . DJD (degenerative joint disease), cervical 12/29/2015  . DDD (degenerative disc disease), lumbar 12/29/2015  . Osteopenia of multiple sites 12/29/2015  . History of migraine 12/29/2015    Past Medical History:  Diagnosis Date  . Asthma   . Fatigue   . Infertility, female     Family History  Problem Relation Age of Onset  . Fibromyalgia Mother   . Kidney failure Mother   . Mental illness Mother   . Heart disease Father   . COPD Father   . Cancer Father        colon, liver   . Hypothyroidism Son    Past Surgical History:  Procedure Laterality Date  . CESAREAN SECTION    . GALLBLADDER SURGERY     Social History   Social History Narrative  . Not on file    Objective: Vital Signs: There were no  vitals taken for this visit.   Physical Exam   Musculoskeletal Exam: ***  CDAI Exam: CDAI Score: Not documented Patient Global Assessment: Not documented; Provider Global Assessment: Not documented Swollen: Not documented; Tender: Not documented Joint Exam   Not documented   There is currently no information documented on the homunculus. Go to the Rheumatology activity and complete the homunculus joint exam.  Investigation: No additional findings.  Imaging: No results found.  Recent Labs: Lab Results  Component Value Date   WBC 6.6 02/21/2017   HGB 12.9 02/21/2017   PLT 327 02/21/2017   NA 137 02/21/2017   K 4.3 02/21/2017   CL 102 02/21/2017   CO2 29 02/21/2017   GLUCOSE 88 02/21/2017   BUN 16 02/21/2017   CREATININE 0.58 02/21/2017   BILITOT 0.3 02/21/2017   ALKPHOS 28 (L) 12/30/2015   AST 22 02/21/2017   ALT 20 02/21/2017   PROT 6.8 02/21/2017   ALBUMIN 4.4 12/30/2015   CALCIUM 9.4 02/21/2017   GFRAA 112 02/21/2017    Speciality Comments: No specialty comments available.  Procedures:  No procedures performed Allergies: Hydrocodone; Codeine; Other; Sulfa antibiotics; Cephalosporins; and Sulfamethoxazole   Assessment / Plan:     Visit Diagnoses: Fibromyalgia  Medication monitoring encounter - Tramadol 50 mg TID PRN, UDS 01/24/17, narcotic agreement: 01/24/17  Pain in both hands - U/s scheduled  Primary osteoarthritis of both hands  Primary osteoarthritis of both feet  DDD (degenerative disc disease), cervical  DDD (degenerative disc disease), lumbar  Osteopenia of multiple sites  Other fatigue  History of migraine  History of vitamin D deficiency   Orders: No orders of the defined types were placed in this encounter.  No orders of the defined types were placed in this encounter.   Face-to-face time spent with patient was *** minutes. Greater than 50% of time was spent in counseling and coordination of care.  Follow-Up Instructions: No  follow-ups on file.   Ofilia Neas, PA-C  Note - This record has been created using Dragon software.  Chart creation errors have been sought, but may not always  have been located. Such creation errors do not reflect on  the standard of medical care.

## 2017-09-30 NOTE — Progress Notes (Deleted)
Office Visit Note  Patient: Lauren Bradley             Date of Birth: [birth year]           MRN: [id number]             PCP: Lavone Orn, MD Referring: Lavone Orn, MD Visit Date: 10/05/2017 Occupation: @GUAROCC @  Subjective:  No chief complaint on file.   History of Present Illness: Lauren Bradley is a 67 y.o. female ***   Activities of Daily Living:  Patient reports morning stiffness for *** {minute/hour:19697}.   Patient {ACTIONS;DENIES/REPORTS:21021675::"Denies"} nocturnal pain.  Difficulty dressing/grooming: {ACTIONS;DENIES/REPORTS:21021675::"Denies"} Difficulty climbing stairs: {ACTIONS;DENIES/REPORTS:21021675::"Denies"} Difficulty getting out of chair: {ACTIONS;DENIES/REPORTS:21021675::"Denies"} Difficulty using hands for taps, buttons, cutlery, and/or writing: {ACTIONS;DENIES/REPORTS:21021675::"Denies"}  No Rheumatology ROS completed.   PMFS History:  Patient Active Problem List   Diagnosis Date Noted  . History of vitamin D deficiency 10/12/2016  . History of seasonal allergies 12/29/2015  . Fibromyalgia 12/29/2015  . Other fatigue 12/29/2015  . Primary osteoarthritis of both hands 12/29/2015  . Primary osteoarthritis of both feet 12/29/2015  . DJD (degenerative joint disease), cervical 12/29/2015  . DDD (degenerative disc disease), lumbar 12/29/2015  . Osteopenia of multiple sites 12/29/2015  . History of migraine 12/29/2015    Past Medical History:  Diagnosis Date  . Asthma   . Fatigue   . Infertility, female     Family History  Problem Relation Age of Onset  . Fibromyalgia Mother   . Kidney failure Mother   . Mental illness Mother   . Heart disease Father   . COPD Father   . Cancer Father        colon, liver   . Hypothyroidism Son    Past Surgical History:  Procedure Laterality Date  . CESAREAN SECTION    . GALLBLADDER SURGERY     Social History   Social History Narrative  . Not on file    Objective: Vital Signs: There were no  vitals taken for this visit.   Physical Exam   Musculoskeletal Exam: ***  CDAI Exam: CDAI Score: Not documented Patient Global Assessment: Not documented; Provider Global Assessment: Not documented Swollen: Not documented; Tender: Not documented Joint Exam   Not documented   There is currently no information documented on the homunculus. Go to the Rheumatology activity and complete the homunculus joint exam.  Investigation: No additional findings.  Imaging: No results found.  Recent Labs: Lab Results  Component Value Date   WBC 6.6 02/21/2017   HGB 12.9 02/21/2017   PLT 327 02/21/2017   NA 137 02/21/2017   K 4.3 02/21/2017   CL 102 02/21/2017   CO2 29 02/21/2017   GLUCOSE 88 02/21/2017   BUN 16 02/21/2017   CREATININE 0.58 02/21/2017   BILITOT 0.3 02/21/2017   ALKPHOS 28 (L) 12/30/2015   AST 22 02/21/2017   ALT 20 02/21/2017   PROT 6.8 02/21/2017   ALBUMIN 4.4 12/30/2015   CALCIUM 9.4 02/21/2017   GFRAA 112 02/21/2017    Speciality Comments: No specialty comments available.  Procedures:  No procedures performed Allergies: Hydrocodone; Codeine; Other; Sulfa antibiotics; Cephalosporins; and Sulfamethoxazole   Assessment / Plan:     Visit Diagnoses: Fibromyalgia - Cymbalta 90 mg daily and tramadol PRN for pain relief  Medication monitoring encounter -  Tramadol 50 mg TID PRN, UDS 01/24/17, narcotic agreement: 01/24/17  Primary osteoarthritis of both hands  Primary osteoarthritis of both feet  DDD (degenerative disc disease), cervical  DDD (  degenerative disc disease), lumbar  Osteopenia of multiple sites  Other fatigue  History of migraine  History of vitamin D deficiency  Trapezius muscle spasm   Orders: No orders of the defined types were placed in this encounter.  No orders of the defined types were placed in this encounter.   Face-to-face time spent with patient was *** minutes. Greater than 50% of time was spent in counseling and  coordination of care.  Follow-Up Instructions: No follow-ups on file.   Ofilia Neas, PA-C  Note - This record has been created using Dragon software.  Chart creation errors have been sought, but may not always  have been located. Such creation errors do not reflect on  the standard of medical care.

## 2017-10-05 ENCOUNTER — Ambulatory Visit: Payer: Medicare Other | Admitting: Physician Assistant

## 2017-10-06 DIAGNOSIS — F322 Major depressive disorder, single episode, severe without psychotic features: Secondary | ICD-10-CM

## 2017-10-13 ENCOUNTER — Ambulatory Visit: Payer: Medicare Other | Admitting: Physician Assistant

## 2017-10-18 ENCOUNTER — Telehealth: Payer: Self-pay | Admitting: Psychiatry

## 2017-10-18 DIAGNOSIS — G43709 Chronic migraine without aura, not intractable, without status migrainosus: Secondary | ICD-10-CM | POA: Diagnosis not present

## 2017-10-18 NOTE — Telephone Encounter (Signed)
Called & ll/m for pt To call back.

## 2017-10-18 NOTE — Telephone Encounter (Signed)
Pt experiencing headaches in the frontal lobe. Stated not the typical migraine headache. Please advise

## 2017-10-18 NOTE — Progress Notes (Signed)
Office Visit Note  Patient: Lauren Bradley             Date of Birth: 1950/04/17           MRN: [id number]             PCP: Lavone Orn, MD Referring: Lavone Orn, MD Visit Date: 10/27/2017 Occupation: '@GUAROCC' @  Subjective:  Pain in both hands   History of Present Illness: Lauren Bradley is a 67 y.o. female with history of fibromyalgia, osteoarthritis, and DDD.  She is on Cymbalta 90 mg by mouth daily.  She takes tramadol 50 mg 1 tablet by mouth 3 times daily as needed for pain relief.  She continues to have generalized muscle aches and muscle tenderness due to fibromyalgia.  Continues to have pain in bilateral hands.  She sees Dr. [name] on a regular basis.  She has had CMC joint injections in the past which have provided temporary relief.  She wears a left CMC joint brace on a daily basis.  She tries wearing braces on bilateral CMC joints at night.  She reports that she has had decreased grip strength and has been dropping things more frequently.  She states that she has been buddy taping her left index and middle finger which has been providing some pain relief.  She continues to have chronic fatigue and insomnia.  She is requesting a refill of tramadol today.    Activities of Daily Living:  Patient reports morning stiffness for 5  minutes.   Patient Reports nocturnal pain.  Difficulty dressing/grooming: Denies Difficulty climbing stairs: Denies Difficulty getting out of chair: Denies Difficulty using hands for taps, buttons, cutlery, and/or writing: Reports  Review of Systems  Constitutional: Positive for fatigue.  HENT: Positive for mouth dryness. Negative for mouth sores and nose dryness.   Eyes: Positive for dryness. Negative for pain and visual disturbance.  Respiratory: Negative for cough, hemoptysis, shortness of breath and difficulty breathing.   Cardiovascular: Negative for chest pain, palpitations, hypertension and swelling in legs/feet.  Gastrointestinal: Negative  for blood in stool, constipation and diarrhea.  Endocrine: Negative for increased urination.  Genitourinary: Negative for painful urination.  Musculoskeletal: Positive for arthralgias, joint pain, myalgias, morning stiffness, muscle tenderness and myalgias. Negative for joint swelling and muscle weakness.  Skin: Positive for rash (Eczema). Negative for color change, pallor, hair loss, nodules/bumps, skin tightness, ulcers and sensitivity to sunlight.  Allergic/Immunologic: Negative for susceptible to infections.  Neurological: Positive for headaches (Hx of migraines, botox injection ). Negative for dizziness, numbness and weakness.  Hematological: Negative for swollen glands.  Psychiatric/Behavioral: Positive for sleep disturbance. Negative for depressed mood. The patient is nervous/anxious.     PMFS History:  Patient Active Problem List   Diagnosis Date Noted  . Major depressive disorder, single episode, severe ([place]) 10/06/2017  . History of vitamin D deficiency 10/12/2016  . History of seasonal allergies 12/29/2015  . Fibromyalgia 12/29/2015  . Other fatigue 12/29/2015  . Primary osteoarthritis of both hands 12/29/2015  . Primary osteoarthritis of both feet 12/29/2015  . DJD (degenerative joint disease), cervical 12/29/2015  . DDD (degenerative disc disease), lumbar 12/29/2015  . Osteopenia of multiple sites 12/29/2015  . History of migraine 12/29/2015    Past Medical History:  Diagnosis Date  . Asthma   . Fatigue   . Infertility, female     Family History  Problem Relation Age of Onset  . Fibromyalgia Mother   . Kidney failure Mother   . Mental  illness Mother   . Heart disease Father   . COPD Father   . Cancer Father        colon, liver   . Hypothyroidism Son    Past Surgical History:  Procedure Laterality Date  . CESAREAN SECTION    . GALLBLADDER SURGERY     Social History   Social History Narrative  . Not on file    Objective: Vital Signs: BP 131/82 (BP  Location: Left Arm, Patient Position: Sitting, Cuff Size: Normal)   Pulse 83   Resp 12   Ht '5\' 3"'  (1.6 m)   Wt 99 lb (44.9 kg)   BMI 17.54 kg/m    Physical Exam  Constitutional: She is oriented to person, place, and time. She appears well-developed and well-nourished.  HENT:  Head: Normocephalic and atraumatic.  Eyes: Conjunctivae and EOM are normal.  Neck: Normal range of motion.  Cardiovascular: Normal rate, regular rhythm, normal heart sounds and intact distal pulses.  Pulmonary/Chest: Effort normal and breath sounds normal.  Abdominal: Soft. Bowel sounds are normal.  Lymphadenopathy:    She has no cervical adenopathy.  Neurological: She is alert and oriented to person, place, and time.  Skin: Skin is warm and dry. Capillary refill takes less than 2 seconds.  Psychiatric: She has a normal mood and affect. Her behavior is normal.  Nursing note and vitals reviewed.    Musculoskeletal Exam: C-spine slightly limited range of motion with lateral rotation.  Thoracic and lumbar spine limited range of motion with discomfort.  She has generalized hyperalgesia on exam.  Shoulder joints, elbow joints, wrist joints, MCPs, PIPs and DIPs good range of motion no synovitis.  She has PIP and DIP synovial thickening consistent with osteoarthritis of bilateral hands.  She has bilateral CMC joint synovial thickening.  Hip joints, knee joints, MTPs, PIPs, DIPs good range of motion no synovitis.  No warmth or effusion bilateral knee joints. Left ankle brace present. right trochanteric bursa tenderness on exam.   CDAI Exam: CDAI Score: Not documented Patient Global Assessment: Not documented; Provider Global Assessment: Not documented Swollen: Not documented; Tender: Not documented Joint Exam   Not documented   There is currently no information documented on the homunculus. Go to the Rheumatology activity and complete the homunculus joint exam.  Investigation: No additional  findings.  Imaging: No results found.  Recent Labs: Lab Results  Component Value Date   WBC 6.6 02/21/2017   HGB 12.9 02/21/2017   PLT 327 02/21/2017   NA 137 02/21/2017   K 4.3 02/21/2017   CL 102 02/21/2017   CO2 29 02/21/2017   GLUCOSE 88 02/21/2017   BUN 16 02/21/2017   CREATININE 0.58 02/21/2017   BILITOT 0.3 02/21/2017   ALKPHOS 28 (L) 12/30/2015   AST 22 02/21/2017   ALT 20 02/21/2017   PROT 6.8 02/21/2017   ALBUMIN 4.4 12/30/2015   CALCIUM 9.4 02/21/2017   GFRAA 112 02/21/2017    Speciality Comments: No specialty comments available.  Procedures:  No procedures performed Allergies: Hydrocodone; Codeine; Other; Sulfa antibiotics; Cephalosporins; and Sulfamethoxazole   Assessment / Plan:     Visit Diagnoses: Fibromyalgia -She has generalized hyperalgesia on exam.  She continues to have generalized muscle aches and muscle tenderness due to fibromyalgia.  She has chronic fatigue and insomnia.  She is taking Cymbalta 90 mg by mouth daily which she feels helps with her pain.  She also takes tramadol 50 mg 1 tablet by mouth 3 times daily as needed for  pain relief.  He has a narcotic agreement were updated today on 10/27/2017.  A refill of tramadol will be sent to the pharmacy.  She is planning to try chair yoga as well as tai chi.  She is encouraged to stay active and exercise on a regular basis.  Medication monitoring encounter - Tramadol 50 mg by mouth TID. UDS and narcotic agreement were updated today on 10/27/2017.  Refill of tramadol will be sent to the pharmacy today.- Plan: Pain Mgmt, Tramadol w/medMATCH, U, Pain Mgmt, Profile 5 w/Conf, U  Primary osteoarthritis of both hands: She has PIP and DIP synovial thickening consistent with osteoarthritis of bilateral hands.  She has bilateral CMC joint synovial thickening.  She wears a left CMC joint brace on a daily basis.  She has been buddy taping her left index and middle finger.  She sees Dr. [name] on a regular basis and  has had bilateral CMC joint cortisone injections.  Joint protection and muscle strengthening were discussed.  Primary osteoarthritis of both feet: She has osteoarthritic changes in bilateral feet.  She has no discomfort at this time.  She was proper fitting shoes.  DDD (degenerative disc disease), cervical: She has slightly limited range of motion of the C-spine.  She has no symptoms of radiculopathy at this time.  DDD (degenerative disc disease), lumbar: Midline spinal tenderness in the lumbar region.  She has limited range of motion with discomfort of her lumbar spine.  She is planning to start to go to geriatric as well as tai chi.  Osteopenia of multiple sites: She takes calcium and vitamin D supplements.  Other fatigue: Chronic.  She experiences daytime drowsiness.   History of migraine: She receives Botox injections for management of migraines.  History of vitamin D deficiency: She is on a calcium and vitamin D supplement.  Medication monitoring encounter -UDS and narcotic agreement were updated today on 10/27/2017.  A refill of tramadol will be sent to the pharmacy.  Plan: Pain Mgmt, Tramadol w/medMATCH, U, Pain Mgmt, Profile 5 w/Conf, U   Orders: No orders of the defined types were placed in this encounter.  Meds ordered this encounter  Medications  . traMADol (ULTRAM) 50 MG tablet    Sig: TAKE 1 TABLET(50 MG) BY MOUTH THREE TIMES DAILY AS NEEDED    Dispense:  90 tablet    Refill:  0    Face-to-face time spent with patient was 30 minutes. Greater than 50% of time was spent in counseling and coordination of care.  Follow-Up Instructions: Return in about 6 months (around 04/28/2018) for Fibromyalgia, Osteoarthritis, DDD.   Ofilia Neas, PA-C  Note - This record has been created using Dragon software.  Chart creation errors have been sought, but may not always  have been located. Such creation errors do not reflect on  the standard of medical care.

## 2017-10-18 NOTE — Telephone Encounter (Signed)
Pt called back.She has been taking Abilify since 10/03/17. Has had Headaches, which are different from her migraine headaches and has gained 5 pounds, even though she is very strict with her diet.Abilify has helped her focus better.But d/t SE she would like to change the medication.

## 2017-10-24 NOTE — Telephone Encounter (Signed)
Gained 5# on Abilify unusual without eating more and worse HA.  Top of head and frontal HA.  Was taking 2 mg.  Cut to 1mg  5 days ago.  Absolute and immediate benefit.  Less HA lower at lower dose.  Concerned about weight.  Option 1/2 tablet QOD other option Rexulti low dose.  Cost concerns about Rexulti.  Disc SE in detail.  For cost reasons reduce Abilify to 1 mg QOD.  She agrees.

## 2017-10-26 ENCOUNTER — Ambulatory Visit: Payer: Self-pay | Admitting: Psychiatry

## 2017-10-27 ENCOUNTER — Ambulatory Visit (INDEPENDENT_AMBULATORY_CARE_PROVIDER_SITE_OTHER): Payer: Medicare Other | Admitting: Physician Assistant

## 2017-10-27 ENCOUNTER — Encounter: Payer: Self-pay | Admitting: Physician Assistant

## 2017-10-27 VITALS — BP 131/82 | HR 83 | Resp 12 | Ht 63.0 in | Wt 99.0 lb

## 2017-10-27 DIAGNOSIS — Z5181 Encounter for therapeutic drug level monitoring: Secondary | ICD-10-CM

## 2017-10-27 DIAGNOSIS — Z8639 Personal history of other endocrine, nutritional and metabolic disease: Secondary | ICD-10-CM

## 2017-10-27 DIAGNOSIS — M19072 Primary osteoarthritis, left ankle and foot: Secondary | ICD-10-CM | POA: Diagnosis not present

## 2017-10-27 DIAGNOSIS — M5030 Other cervical disc degeneration, unspecified cervical region: Secondary | ICD-10-CM | POA: Diagnosis not present

## 2017-10-27 DIAGNOSIS — M19042 Primary osteoarthritis, left hand: Secondary | ICD-10-CM | POA: Diagnosis not present

## 2017-10-27 DIAGNOSIS — M5136 Other intervertebral disc degeneration, lumbar region: Secondary | ICD-10-CM

## 2017-10-27 DIAGNOSIS — M8589 Other specified disorders of bone density and structure, multiple sites: Secondary | ICD-10-CM

## 2017-10-27 DIAGNOSIS — M19071 Primary osteoarthritis, right ankle and foot: Secondary | ICD-10-CM | POA: Diagnosis not present

## 2017-10-27 DIAGNOSIS — M19041 Primary osteoarthritis, right hand: Secondary | ICD-10-CM | POA: Diagnosis not present

## 2017-10-27 DIAGNOSIS — Z8669 Personal history of other diseases of the nervous system and sense organs: Secondary | ICD-10-CM

## 2017-10-27 DIAGNOSIS — M797 Fibromyalgia: Secondary | ICD-10-CM | POA: Diagnosis not present

## 2017-10-27 DIAGNOSIS — R5383 Other fatigue: Secondary | ICD-10-CM

## 2017-10-27 MED ORDER — TRAMADOL HCL 50 MG PO TABS: ORAL_TABLET | ORAL | 0 refills | Status: DC

## 2017-10-29 LAB — PAIN MGMT, PROFILE 5 W/CONF, U
AMPHETAMINES: NEGATIVE ng/mL (ref <500)
Barbiturates: NEGATIVE ng/mL (ref <300)
Benzodiazepines: NEGATIVE ng/mL (ref <100)
CREATININE: 9.8 mg/dL — AB
Cocaine Metabolite: NEGATIVE ng/mL (ref <150)
MARIJUANA METABOLITE: NEGATIVE ng/mL (ref <20)
METHADONE METABOLITE: NEGATIVE ng/mL (ref <100)
OPIATES: NEGATIVE ng/mL (ref <100)
Oxidant: NEGATIVE ug/mL (ref <200)
Oxycodone: NEGATIVE ng/mL (ref <100)
SPECIFIC GRAVITY: 1.004 (ref >=1.0)
pH: 6.83 (ref 4.5–9.0)

## 2017-10-29 LAB — PAIN MGMT, TRAMADOL W/MEDMATCH, U
DESMETHYLTRAMADOL: NEGATIVE ng/mL (ref <100)
Tramadol: NEGATIVE ng/mL (ref <100)

## 2017-10-31 ENCOUNTER — Other Ambulatory Visit: Payer: Self-pay | Admitting: Psychiatry

## 2017-10-31 NOTE — Progress Notes (Signed)
Patient takes tramadol infrequently.

## 2017-11-01 DIAGNOSIS — N958 Other specified menopausal and perimenopausal disorders: Secondary | ICD-10-CM | POA: Diagnosis not present

## 2017-11-01 DIAGNOSIS — Z1231 Encounter for screening mammogram for malignant neoplasm of breast: Secondary | ICD-10-CM | POA: Diagnosis not present

## 2017-11-01 DIAGNOSIS — Z01419 Encounter for gynecological examination (general) (routine) without abnormal findings: Secondary | ICD-10-CM | POA: Diagnosis not present

## 2017-11-01 DIAGNOSIS — Z681 Body mass index (BMI) 19 or less, adult: Secondary | ICD-10-CM | POA: Diagnosis not present

## 2017-11-01 DIAGNOSIS — M8588 Other specified disorders of bone density and structure, other site: Secondary | ICD-10-CM | POA: Diagnosis not present

## 2017-11-10 DIAGNOSIS — Z23 Encounter for immunization: Secondary | ICD-10-CM | POA: Diagnosis not present

## 2017-11-17 DIAGNOSIS — L821 Other seborrheic keratosis: Secondary | ICD-10-CM | POA: Diagnosis not present

## 2017-11-17 DIAGNOSIS — Z808 Family history of malignant neoplasm of other organs or systems: Secondary | ICD-10-CM | POA: Diagnosis not present

## 2017-11-17 DIAGNOSIS — L986 Other infiltrative disorders of the skin and subcutaneous tissue: Secondary | ICD-10-CM | POA: Diagnosis not present

## 2017-11-17 DIAGNOSIS — D485 Neoplasm of uncertain behavior of skin: Secondary | ICD-10-CM | POA: Diagnosis not present

## 2017-11-17 DIAGNOSIS — Z411 Encounter for cosmetic surgery: Secondary | ICD-10-CM | POA: Diagnosis not present

## 2017-11-17 DIAGNOSIS — L720 Epidermal cyst: Secondary | ICD-10-CM | POA: Diagnosis not present

## 2017-12-01 ENCOUNTER — Ambulatory Visit: Payer: Self-pay | Admitting: Psychiatry

## 2017-12-06 ENCOUNTER — Ambulatory Visit (INDEPENDENT_AMBULATORY_CARE_PROVIDER_SITE_OTHER): Payer: Medicare Other | Admitting: Psychiatry

## 2017-12-06 ENCOUNTER — Encounter: Payer: Self-pay | Admitting: Psychiatry

## 2017-12-06 DIAGNOSIS — F411 Generalized anxiety disorder: Secondary | ICD-10-CM | POA: Diagnosis not present

## 2017-12-06 DIAGNOSIS — F900 Attention-deficit hyperactivity disorder, predominantly inattentive type: Secondary | ICD-10-CM

## 2017-12-06 DIAGNOSIS — F331 Major depressive disorder, recurrent, moderate: Secondary | ICD-10-CM

## 2017-12-06 NOTE — Progress Notes (Signed)
[name] [id number] [birth year] 67 y.o.  Subjective:   Patient ID:  Lauren Bradley is a 67 y.o. (DOB 28-May-1950) female.  Chief Complaint:  Chief Complaint  Patient presents with  . Depression  . Anxiety  . ADHD    HPI Lauren Bradley presents to the office today for follow-up of depression, anxiety and insomnia and chronic pain.  Abilify helped the depression clearly and very beneficial.  Higher dosages Abilify caused worsening HA.  Initially it caused great energy like a motor running and reduced need for sleep to 5-6 hours.  That has resolved.  SE weight gain without increasing calories.  Changed her sleep schedule for the better.  To bed earlier and up earlier.    I didn't realize how depressed I was.  Better organization and function and productivity.  Mother compulsively and annoyingly call her repeatedly.  Feels shame over the feelings she has about her mother.  Major boundary issues from her mother.  Mother was neglectful in her childhood.  Recognizes some blacked out memories from her childhood.  Review of Systems:  Review of Systems  Neurological: Positive for headaches. Negative for tremors and weakness.  Psychiatric/Behavioral: Negative for agitation, behavioral problems, confusion, decreased concentration, dysphoric mood, hallucinations, self-injury, sleep disturbance and suicidal ideas. The patient is nervous/anxious. The patient is not hyperactive.     Medications: I have reviewed the patient's current medications.  Current Outpatient Medications  Medication Sig Dispense Refill  . ALPRAZolam (XANAX) 0.5 MG tablet TAKE 2 TABLETS BY MOUTH EVERY NIGHT AT BEDTIME AND 1 TABLET EVERY DAY AS NEEDED 75 tablet 1  . ARIPiprazole (ABILIFY) 2 MG tablet Take 1 mg by mouth every morning.     . calcium carbonate (CALCIUM 600) 600 MG TABS tablet Take 600 mg by mouth.    . DULoxetine (CYMBALTA) 30 MG capsule Take 30 mg by mouth daily.     . DULoxetine (CYMBALTA) 60 MG capsule Take  60 mg by mouth daily. Takes 30 mg and 60 mg to total daily dose of 90 mg    . estradiol (VIVELLE-DOT) 0.025 MG/24HR Place onto the skin.    . fluticasone (FLOVENT HFA) 110 MCG/ACT inhaler Inhale into the lungs as needed.     Marland Kitchen MAGNESIUM MALATE PO Take by mouth daily.    . mometasone (NASONEX) 50 MCG/ACT nasal spray Place into the nose.    . potassium iodide (SSKI) 1 GM/ML solution     . progesterone (PROMETRIUM) 200 MG capsule Take by mouth.    . Thyroid (NATURE-THROID) 113.75 MG TABS     . traMADol (ULTRAM) 50 MG tablet TAKE 1 TABLET(50 MG) BY MOUTH THREE TIMES DAILY AS NEEDED 90 tablet 0  . Vitamin D, Ergocalciferol, (DRISDOL) 50000 units CAPS capsule Take 50,000 Units by mouth every 7 (seven) days.     . SUMAtriptan (IMITREX) 100 MG tablet TAKE 1 TABLET BY MOUTH AT ONSET OF HEADACHE. MAY REPEAT IN 2 HOURS. MAX 2 TABLETS PER DAY     No current facility-administered medications for this visit.     Medication Side Effects: Headache, weight gain  Allergies:  Allergies  Allergen Reactions  . Hydrocodone Itching  . Codeine Nausea And Vomiting  . Other   . Sulfa Antibiotics   . Cephalosporins Rash  . Sulfamethoxazole Rash    Unknown reaction    Past Medical History:  Diagnosis Date  . Asthma   . Fatigue   . Infertility, female     Family History  Problem Relation Age of Onset  . Fibromyalgia Mother   . Kidney failure Mother   . Mental illness Mother   . Heart disease Father   . COPD Father   . Cancer Father        colon, liver   . Hypothyroidism Son     Social History   Socioeconomic History  . Marital status: Single    Spouse name: Not on file  . Number of children: Not on file  . Years of education: Not on file  . Highest education level: Not on file  Occupational History  . Not on file  Social Needs  . Financial resource strain: Not on file  . Food insecurity:    Worry: Not on file    Inability: Not on file  . Transportation needs:    Medical: Not on  file    Non-medical: Not on file  Tobacco Use  . Smoking status: Never Smoker  . Smokeless tobacco: Never Used  Substance and Sexual Activity  . Alcohol use: Yes    Comment: "Very little"  . Drug use: No  . Sexual activity: Not on file  Lifestyle  . Physical activity:    Days per week: Not on file    Minutes per session: Not on file  . Stress: Not on file  Relationships  . Social connections:    Talks on phone: Not on file    Gets together: Not on file    Attends religious service: Not on file    Active member of club or organization: Not on file    Attends meetings of clubs or organizations: Not on file    Relationship status: Not on file  . Intimate partner violence:    Fear of current or ex partner: Not on file    Emotionally abused: Not on file    Physically abused: Not on file    Forced sexual activity: Not on file  Other Topics Concern  . Not on file  Social History Narrative  . Not on file    Past Medical History, Surgical history, Social history, and Family history were reviewed and updated as appropriate.   Please see review of systems for further details on the patient's review from today.   Objective:   Physical Exam:  There were no vitals taken for this visit.  Physical Exam  Constitutional: She is oriented to person, place, and time. She appears well-developed. No distress.  Musculoskeletal: She exhibits no deformity.  Neurological: She is alert and oriented to person, place, and time. She displays no tremor. Coordination and gait normal.  Psychiatric: Her speech is normal and behavior is normal. Judgment and thought content normal. Her mood appears anxious. Her affect is not angry, not blunt, not labile and not inappropriate. Cognition and memory are normal. She does not exhibit a depressed mood. She expresses no homicidal and no suicidal ideation. She expresses no suicidal plans and no homicidal plans.  Insight intact. No auditory or visual  hallucinations.  Scattered style. She is inattentive.    Lab Review:     Component Value Date/Time   NA 137 02/21/2017 1635   K 4.3 02/21/2017 1635   CL 102 02/21/2017 1635   CO2 29 02/21/2017 1635   GLUCOSE 88 02/21/2017 1635   BUN 16 02/21/2017 1635   CREATININE 0.58 02/21/2017 1635   CALCIUM 9.4 02/21/2017 1635   PROT 6.8 02/21/2017 1635   ALBUMIN 4.4 12/30/2015 1507   AST 22 02/21/2017 1635  ALT 20 02/21/2017 1635   ALKPHOS 28 (L) 12/30/2015 1507   BILITOT 0.3 02/21/2017 1635   GFRNONAA 97 02/21/2017 1635   GFRAA 112 02/21/2017 1635       Component Value Date/Time   WBC 6.6 02/21/2017 1635   RBC 4.36 02/21/2017 1635   HGB 12.9 02/21/2017 1635   HCT 38.2 02/21/2017 1635   PLT 327 02/21/2017 1635   MCV 87.6 02/21/2017 1635   MCH 29.6 02/21/2017 1635   MCHC 33.8 02/21/2017 1635   RDW 12.4 02/21/2017 1635   LYMPHSABS 1,228 02/21/2017 1635   MONOABS 402 12/30/2015 1507   EOSABS 132 02/21/2017 1635   BASOSABS 73 02/21/2017 1635    No results found for: POCLITH, LITHIUM   No results found for: PHENYTOIN, PHENOBARB, VALPROATE, CBMZ   .res Assessment: Plan:    Major depressive disorder, recurrent episode, moderate (HCC)  Generalized anxiety disorder  Attention deficit hyperactivity disorder (ADHD), predominantly inattentive type   Remains very stressed with mother.  Less depressed with the Abilify but HA.  We discussed the short-term risks associated with benzodiazepines including sedation and increased fall risk among others.  Discussed long-term side effect risk including dependence, potential withdrawal symptoms, and the potential eventual dose-related risk of dementia. Try reducing Xanax for sleep if possible.  However insomnia is also a risk factor for dementia.  Discussed potential metabolic side effects associated with atypical antipsychotics, as well as potential risk for movement side effects. Advised pt to contact office if movement side effects  occur.  BC of HA from Abilify recommend trial of Rexulti at -.25 to 0.5 mg daily. Gave samples for 1 month. Discussed the alternative of pramipexole off label   Later could consider duloxetine for cost reasons.  Stress of dealing with the lying and manipulation of hher mother.  Disc this in therapy. And Family problems.  45 min appt  FU 8 weeks  Lynder Parents, MD, DFAPA   Please see After Visit Summary for patient specific instructions.  Future Appointments  Date Time Provider [place]  04/27/2018  3:20 PM Ofilia Neas, PA-C [facility] None    No orders of the defined types were placed in this encounter.     -------------------------------

## 2017-12-06 NOTE — Patient Instructions (Signed)
Stop Abilify and start Rexulti 0.5mg  either one or 1/2 daily.  If headaches occur reduce the dosage.

## 2017-12-20 ENCOUNTER — Telehealth: Payer: Self-pay | Admitting: Psychiatry

## 2017-12-20 NOTE — Telephone Encounter (Signed)
Ok re return to Abilify 1 mg daily.  Give her the option of taking 1 mg every other day.  This is because she has headaches with it daily.  He has a long half-life and every other day dosing may be sufficient to give her the benefit that she needs and have less headache.

## 2017-12-20 NOTE — Telephone Encounter (Signed)
Given information and she will try that dosage. Instructed to call back with any problems

## 2017-12-20 NOTE — Telephone Encounter (Signed)
Lauren Bradley tried Rexulti and it doesn't give her the same amount of HA as the Abilify but the cost is too high. She cannot get the cost down with coupons or copay card or Good [facility].  She will finish out the [place] and then just go back on the Abilify. No need to return call.

## 2017-12-24 ENCOUNTER — Other Ambulatory Visit: Payer: Self-pay | Admitting: Physician Assistant

## 2017-12-26 NOTE — Telephone Encounter (Signed)
Last visit: 10/27/17 Next Visit: 04/27/18 UDS: 10/27/17 Narc Agreement: 10/27/17   Last Fill: 10/27/17  Okay to refill Tramadol?

## 2018-02-01 DIAGNOSIS — G43709 Chronic migraine without aura, not intractable, without status migrainosus: Secondary | ICD-10-CM | POA: Diagnosis not present

## 2018-02-07 ENCOUNTER — Encounter: Payer: Self-pay | Admitting: Psychiatry

## 2018-02-07 ENCOUNTER — Ambulatory Visit (INDEPENDENT_AMBULATORY_CARE_PROVIDER_SITE_OTHER): Payer: Medicare Other | Admitting: Psychiatry

## 2018-02-07 DIAGNOSIS — F331 Major depressive disorder, recurrent, moderate: Secondary | ICD-10-CM | POA: Diagnosis not present

## 2018-02-07 DIAGNOSIS — F900 Attention-deficit hyperactivity disorder, predominantly inattentive type: Secondary | ICD-10-CM | POA: Diagnosis not present

## 2018-02-07 DIAGNOSIS — F411 Generalized anxiety disorder: Secondary | ICD-10-CM | POA: Diagnosis not present

## 2018-02-07 DIAGNOSIS — G4710 Hypersomnia, unspecified: Secondary | ICD-10-CM

## 2018-02-07 MED ORDER — MODAFINIL 100 MG PO TABS: 100.0000 mg | ORAL_TABLET | Freq: Every day | ORAL | 0 refills | Status: DC

## 2018-02-07 NOTE — Progress Notes (Signed)
Lauren Bradley [id number] [birth year] 69 y.o.  Subjective:   Patient ID:  Lauren Bradley is a 68 y.o. (DOB Aug 10, 1950) female.  Chief Complaint:  Chief Complaint  Patient presents with  . Follow-up    Medication Management    HPI Lauren Bradley presents to the office today for follow-up of depression, anxiety and insomnia and chronic pain.  Abilify helped the depression clearly and very beneficial.  Higher dosages Abilify caused worsening HA.  Initially it caused great energy like a motor running and reduced need for sleep to 5-6 hours.  That has resolved.  SE weight gain without increasing calories.  Changed her sleep schedule for the better.  To bed earlier and up earlier.    I didn't realize how depressed I was.  Better organization and function and productivity.    Couldn't afford Rexulti though it helped without HA.  Had to return to Abilify and it is causing less HA.  Abilify helped energy and reduced excess sleep.  Usually sleep 9 hours and now 6 hours.  But still hard to stay awake in afternoon if she drives.  Been going on for years.  OK unless own the highway and drinks caffeine.  Mother compulsively and annoyingly call her repeatedly.  Feels shame over the feelings she has about her mother.  Major boundary issues from her mother.  Mother was neglectful in her childhood.  Recognizes some blacked out memories from her childhood.  Some social anxiety still significant.  Review of Systems:  Review of Systems  Neurological: Positive for headaches. Negative for tremors and weakness.  Psychiatric/Behavioral: Negative for agitation, behavioral problems, confusion, decreased concentration, dysphoric mood, hallucinations, self-injury, sleep disturbance and suicidal ideas. The patient is nervous/anxious. The patient is not hyperactive.     Medications: I have reviewed the patient's current medications.  Current Outpatient Medications  Medication Sig Dispense Refill  . ALPRAZolam  (XANAX) 0.5 MG tablet TAKE 2 TABLETS BY MOUTH EVERY NIGHT AT BEDTIME AND 1 TABLET EVERY DAY AS NEEDED 75 tablet 1  . ARIPiprazole (ABILIFY) 2 MG tablet Take 1 mg by mouth every morning.     . calcium carbonate (CALCIUM 600) 600 MG TABS tablet Take 600 mg by mouth.    . DULoxetine (CYMBALTA) 30 MG capsule Take 30 mg by mouth daily.     . DULoxetine (CYMBALTA) 60 MG capsule Take 60 mg by mouth daily. Takes 30 mg and 60 mg to total daily dose of 90 mg    . estradiol (VIVELLE-DOT) 0.025 MG/24HR Place onto the skin.    . fluticasone (FLOVENT HFA) 110 MCG/ACT inhaler Inhale into the lungs as needed.     Marland Kitchen MAGNESIUM MALATE PO Take by mouth daily.    . mometasone (NASONEX) 50 MCG/ACT nasal spray Place into the nose.    . potassium iodide (SSKI) 1 GM/ML solution     . progesterone (PROMETRIUM) 200 MG capsule Take by mouth.    . SUMAtriptan (IMITREX) 100 MG tablet TAKE 1 TABLET BY MOUTH AT ONSET OF HEADACHE. MAY REPEAT IN 2 HOURS. MAX 2 TABLETS PER DAY    . Thyroid (NATURE-THROID) 113.75 MG TABS     . traMADol (ULTRAM) 50 MG tablet TAKE 1 TABLET(50 MG) BY MOUTH THREE TIMES DAILY AS NEEDED 90 tablet 0  . Vitamin D, Ergocalciferol, (DRISDOL) 50000 units CAPS capsule Take 50,000 Units by mouth every 7 (seven) days.     . modafinil (PROVIGIL) 100 MG tablet Take 1 tablet (100 mg total) by  mouth daily. 30 tablet 0   No current facility-administered medications for this visit.     Medication Side Effects: Headache, weight gain  Allergies:  Allergies  Allergen Reactions  . Hydrocodone Itching  . Codeine Nausea And Vomiting  . Other   . Sulfa Antibiotics   . Cephalosporins Rash  . Sulfamethoxazole Rash    Unknown reaction    Past Medical History:  Diagnosis Date  . Asthma   . Fatigue   . Infertility, female     Family History  Problem Relation Age of Onset  . Fibromyalgia Mother   . Kidney failure Mother   . Mental illness Mother   . Heart disease Father   . COPD Father   . Cancer Father         colon, liver   . Hypothyroidism Son     Social History   Socioeconomic History  . Marital status: Single    Spouse name: Not on file  . Number of children: Not on file  . Years of education: Not on file  . Highest education level: Not on file  Occupational History  . Not on file  Social Needs  . Financial resource strain: Not on file  . Food insecurity:    Worry: Not on file    Inability: Not on file  . Transportation needs:    Medical: Not on file    Non-medical: Not on file  Tobacco Use  . Smoking status: Never Smoker  . Smokeless tobacco: Never Used  Substance and Sexual Activity  . Alcohol use: Yes    Comment: "Very little"  . Drug use: No  . Sexual activity: Not on file  Lifestyle  . Physical activity:    Days per week: Not on file    Minutes per session: Not on file  . Stress: Not on file  Relationships  . Social connections:    Talks on phone: Not on file    Gets together: Not on file    Attends religious service: Not on file    Active member of club or organization: Not on file    Attends meetings of clubs or organizations: Not on file    Relationship status: Not on file  . Intimate partner violence:    Fear of current or ex partner: Not on file    Emotionally abused: Not on file    Physically abused: Not on file    Forced sexual activity: Not on file  Other Topics Concern  . Not on file  Social History Narrative  . Not on file    Past Medical History, Surgical history, Social history, and Family history were reviewed and updated as appropriate.   Please see review of systems for further details on the patient's review from today.   Objective:   Physical Exam:  There were no vitals taken for this visit.  Physical Exam Constitutional:      General: She is not in acute distress.    Appearance: She is well-developed.  Musculoskeletal:        General: No deformity.  Neurological:     Mental Status: She is alert and oriented to person,  place, and time.     Motor: No tremor.     Coordination: Coordination normal.     Gait: Gait normal.  Psychiatric:        Attention and Perception: She is inattentive.        Mood and Affect: Mood is anxious. Mood is not depressed.  Affect is not labile, blunt, angry or inappropriate.        Speech: Speech normal.        Behavior: Behavior normal.        Thought Content: Thought content normal. Thought content does not include homicidal or suicidal ideation. Thought content does not include homicidal or suicidal plan.        Judgment: Judgment normal.     Comments: Insight intact. No auditory or visual hallucinations.  Scattered style.     Lab Review:     Component Value Date/Time   NA 137 02/21/2017 1635   K 4.3 02/21/2017 1635   CL 102 02/21/2017 1635   CO2 29 02/21/2017 1635   GLUCOSE 88 02/21/2017 1635   BUN 16 02/21/2017 1635   CREATININE 0.58 02/21/2017 1635   CALCIUM 9.4 02/21/2017 1635   PROT 6.8 02/21/2017 1635   ALBUMIN 4.4 12/30/2015 1507   AST 22 02/21/2017 1635   ALT 20 02/21/2017 1635   ALKPHOS 28 (L) 12/30/2015 1507   BILITOT 0.3 02/21/2017 1635   GFRNONAA 97 02/21/2017 1635   GFRAA 112 02/21/2017 1635       Component Value Date/Time   WBC 6.6 02/21/2017 1635   RBC 4.36 02/21/2017 1635   HGB 12.9 02/21/2017 1635   HCT 38.2 02/21/2017 1635   PLT 327 02/21/2017 1635   MCV 87.6 02/21/2017 1635   MCH 29.6 02/21/2017 1635   MCHC 33.8 02/21/2017 1635   RDW 12.4 02/21/2017 1635   LYMPHSABS 1,228 02/21/2017 1635   MONOABS 402 12/30/2015 1507   EOSABS 132 02/21/2017 1635   BASOSABS 73 02/21/2017 1635    No results found for: POCLITH, LITHIUM   No results found for: PHENYTOIN, PHENOBARB, VALPROATE, CBMZ   .res Assessment: Plan:    Major depressive disorder, recurrent episode, moderate (HCC)  Generalized anxiety disorder  Attention deficit hyperactivity disorder (ADHD), predominantly inattentive type   Remains very stressed with mother.  "Driving  me out of my mind!"  Disc this in detail and how to handle her manipulation and hypochondriasis.  Having to help her a lot.  Disc limit setting and boundaries.  Less depressed with the Abilify but HA.  She is working the dosage up more slowly and is tolerating it better. Goal of 2 mg daily/.  We discussed the short-term risks associated with benzodiazepines including sedation and increased fall risk among others.  Discussed long-term side effect risk including dependence, potential withdrawal symptoms, and the potential eventual dose-related risk of dementia. Try reducing Xanax for sleep if possible.  However insomnia is also a risk factor for dementia.  Disc lithium's protective effects for Alzheimer's Dz at even low dosage.  Gave a copy of Dr. Narda Rutherford article and she is taking the low dosage lithium orotate bc of the FHx of Alzheimer's.   Extensive discussion.  Discussed potential metabolic side effects associated with atypical antipsychotics, as well as potential risk for movement side effects. Advised pt to contact office if movement side effects occur.   Discussed the alternative of pramipexole off label  Later could consider duloxetine for cost reasons.  Stress of dealing with the lying and manipulation of hher mother.  Disc this in therapy. And Family problems.  45 min appt  FU 8 weeks  Lynder Parents, MD, DFAPA   Please see After Visit Summary for patient specific instructions.  Future Appointments  Date Time Provider [place]  04/27/2018  3:20 PM Ofilia Neas, PA-C [facility] None  No orders of the defined types were placed in this encounter.     -------------------------------

## 2018-02-07 NOTE — Patient Instructions (Addendum)
Modafinil 100 mg tablet  1 to 2 tablets daily as needed for excessive sleepiness.  Try Calm app

## 2018-02-09 ENCOUNTER — Telehealth: Payer: Self-pay

## 2018-02-09 MED ORDER — MODAFINIL 100 MG PO TABS: 100.0000 mg | ORAL_TABLET | Freq: Every day | ORAL | 0 refills | Status: DC

## 2018-02-09 NOTE — Telephone Encounter (Signed)
Pt is needing to change modafinil to [facility] on [address] so she can use goodrx and it cost less.   Medication called into [facility]

## 2018-03-10 ENCOUNTER — Other Ambulatory Visit: Payer: Self-pay

## 2018-03-10 MED ORDER — ARIPIPRAZOLE 2 MG PO TABS: 1.0000 mg | ORAL_TABLET | Freq: Every morning | ORAL | 0 refills | Status: DC

## 2018-03-10 MED ORDER — DULOXETINE HCL 30 MG PO CPEP: 30.0000 mg | ORAL_CAPSULE | Freq: Every day | ORAL | 0 refills | Status: DC

## 2018-03-10 MED ORDER — DULOXETINE HCL 60 MG PO CPEP: 60.0000 mg | ORAL_CAPSULE | Freq: Every day | ORAL | 0 refills | Status: DC

## 2018-03-10 NOTE — Progress Notes (Signed)
Received refill request for duloxetine 30mg  and 60mg  and aripiprazole 2mg . 90 day refill. Needs follow up scheduled sooner than May per last office visit.

## 2018-03-13 ENCOUNTER — Other Ambulatory Visit: Payer: Self-pay | Admitting: Physician Assistant

## 2018-03-13 NOTE — Telephone Encounter (Signed)
Last visit: 10/27/17 Next Visit: 04/27/18 UDS: 10/27/17 Narc Agreement: 10/27/17   Last Fill: 12/26/17  Okay to refill Tramadol?

## 2018-04-18 ENCOUNTER — Telehealth: Payer: Self-pay | Admitting: Rheumatology

## 2018-04-18 NOTE — Telephone Encounter (Signed)
Patient called and scheduled her Virtual phone visit on 4/16 at 2:30 pm and would like to email a picture of her thumb for Dr. Arlean Hopping review.  Patient is requesting a return call to let her know which email to send it to.

## 2018-04-18 NOTE — Progress Notes (Deleted)
Virtual Visit via Telephone Note  I connected with Lauren Bradley on 04/18/18 at  2:30 PM EDT by telephone and verified that I am speaking with the correct person using two identifiers.   I discussed the limitations, risks, security and privacy concerns of performing an evaluation and management service by telephone and the availability of in person appointments. I also discussed with the patient that there may be a patient responsible charge related to this service. The patient expressed understanding and agreed to proceed.  CC: History of Present Illness:   ROS Observations/Objective:  Physical Exam Assessment and Plan: Visit Diagnoses: Fibromyalgia -  Medication monitoring encounter - Tramadol 50 mg by mouth TID.   Primary osteoarthritis of both hands: .  Primary osteoarthritis of both feet:   DDD (degenerative disc disease), cervical:   DDD (degenerative disc disease), lumbar:   Osteopenia of multiple sites:   Other fatigue:   History of migraine: She receives Botox injections for management of migraines.  History of vitamin D deficiency:    Follow Up Instructions:    I discussed the assessment and treatment plan with the patient. The patient was provided an opportunity to ask questions and all were answered. The patient agreed with the plan and demonstrated an understanding of the instructions.   The patient was advised to call back or seek an in-person evaluation if the symptoms worsen or if the condition fails to improve as anticipated.  I provided *** minutes of non-face-to-face time during this encounter.   Earnestine Mealing, CMA

## 2018-04-19 NOTE — Telephone Encounter (Signed)
Attempted to contact the patient and left message for patient to call the office.  

## 2018-04-24 ENCOUNTER — Telehealth: Payer: Self-pay | Admitting: Rheumatology

## 2018-04-24 ENCOUNTER — Encounter: Payer: Self-pay | Admitting: Rheumatology

## 2018-04-24 NOTE — Telephone Encounter (Signed)
Walked patient though sending a picture on MyChart. Patient verbalized understanding and will send pictures of her thumb through MyChart.

## 2018-04-24 NOTE — Telephone Encounter (Signed)
Patient called stating she was returning Andrea's call.  Patient is scheduled for a virtual phone appointment on 4/16.  Patient states she would like to email a picture of her thumb.  Patient was told that she could send the picture through her Mychart, but patient states she wasn't able to transfer the picture.

## 2018-04-25 NOTE — Progress Notes (Signed)
Virtual Visit via Telephone Note  I connected with [name] on 04/25/18 at 12:30 PM EDT by telephone and verified that I am speaking with the correct person using two identifiers.   I discussed the limitations, risks, security and privacy concerns of performing an evaluation and management service by telephone and the availability of in person appointments. I also discussed with the patient that there may be a patient responsible charge related to this service. The patient expressed understanding and agreed to proceed.  CC: Bilateral CMC joint pain  History of Present Illness: Patient is a 68 year old female with a past medical history of fibromyalgia and osteoarthritis. She has chronic bilateral CMC joint pain.  She states the pain is more severe in the left [facility] joint due to being left handed.  She has noticed more inflammation in both CMC joints.  She follows up with Dr. [name].  She has had cortisone injections in the past.  She also wears braces on a regular basis.  She has found celebrex to be very effective in the past but she has not taken it recently.  She states the pain in both CMC joints is interfering with ADLs.  She is not ready to proceed with surgery at this time.  She continues to follow a natural antiinflammatory diet. She states in the past she took celebrex but she was under increased stress and developed elevated BP and had to discontinue celebrex in the past.  Her BP has been well controlled and she is not on any anti-hypertensive meds currently.       Review of Systems  Constitutional: Negative for fever and malaise/fatigue.  Eyes: Negative for photophobia, pain, discharge and redness.  Respiratory: Negative for cough, shortness of breath and wheezing.   Cardiovascular: Negative for chest pain and palpitations.  Gastrointestinal: Negative for blood in stool, constipation and diarrhea.  Genitourinary: Negative for dysuria.  Musculoskeletal: Positive for joint pain.  Negative for back pain, myalgias and neck pain.  Skin: Negative for rash.  Neurological: Negative for dizziness and headaches.  Psychiatric/Behavioral: Negative for depression. The patient is not nervous/anxious and does not have insomnia.    Observations/Objective:  Physical Exam  Constitutional: She is oriented to person, place, and time.  Neurological: She is alert and oriented to person, place, and time.  Psychiatric: Mood, memory, affect and judgment normal.    Patient reports morning stiffness for 30 minutes.   Patient reports nocturnal pain.  Difficulty dressing/grooming: Denies Difficulty climbing stairs: Reports Difficulty getting out of chair: Denies Difficulty using hands for taps, buttons, cutlery, and/or writing: Reports    Assessment and Plan: Fibromyalgia -She continues to have generalized muscle aches and muscle tenderness due to fibromyalgia. She is taking Cymbalta 90 mg by mouth daily which she feels helps with her pain.  She also takes tramadol 50 mg 1 tablet by mouth 3 times daily as needed for pain relief.  She has chronic fatigue and insomnia.  The importance of regular exercise and good sleep hygiene was discussed.   Medication monitoring encounter - Tramadol 50 mg by mouth TID. UDS and narcotic agreement were updated on 10/27/2017.    Primary osteoarthritis of both hands: She is having severe bilateral CMC joint pain.  The pain is most severe in the left [facility] joint.  She has noticed increased inflammation.  The discomfort she has been experiencing has been interfering with ADLs. She wears a left CMC joint brace on a daily basis.  She has been  buddy taping her left index and middle fingers.  She sees Dr. [name] on a regular basis and has had bilateral CMC joint cortisone injections in the past.  She cannot tolerate using voltaren gel due to GI side effects.  She would like to restart on Celebrex for pain relief.  We discussed the importance of regular lab monitoring.   Future orders for CBC and CMP were placed today.  If labs are stable we will send in a prescription for Celebrex 100 mg po daily prn.  She continues to take tramadol for pain relief.  We discussed trying aspercreme topically. She is following a natural inflammatory diet and has introduced natural antiinflammatory supplements.    Primary osteoarthritis of both feet: She has chronic pain in both feet.  She sees a Art therapist in [place].   DDD (degenerative disc disease), cervical: She has no discomfort at this time.   DDD (degenerative disc disease), lumbar: She has no discomfort at this time.  Osteopenia of multiple sites: She takes calcium and vitamin D supplements.  Other fatigue: Chronic.  She experiences daytime drowsiness.   Follow Up Instructions: She will follow up in 6 months. Future orders for CBC and CMP were placed today. If labs are stable, we will send in a prescription for Celebrex 100 mg 1 tablet by mouth daily PRN for pain relief.    I discussed the assessment and treatment plan with the patient. The patient was provided an opportunity to ask questions and all were answered. The patient agreed with the plan and demonstrated an understanding of the instructions.   The patient was advised to call back or seek an in-person evaluation if the symptoms worsen or if the condition fails to improve as anticipated.  I provided 25 minutes of non-face-to-face time during this encounter. Shaili Deveshwar    Scribed byOfilia Neas, PA-C

## 2018-04-25 NOTE — Telephone Encounter (Signed)
Patient will discuss tomorrow during the E-visit.

## 2018-04-26 ENCOUNTER — Telehealth (INDEPENDENT_AMBULATORY_CARE_PROVIDER_SITE_OTHER): Payer: Medicare Other | Admitting: Rheumatology

## 2018-04-26 ENCOUNTER — Encounter: Payer: Self-pay | Admitting: Rheumatology

## 2018-04-26 ENCOUNTER — Other Ambulatory Visit: Payer: Self-pay

## 2018-04-26 DIAGNOSIS — M51369 Other intervertebral disc degeneration, lumbar region without mention of lumbar back pain or lower extremity pain: Secondary | ICD-10-CM

## 2018-04-26 DIAGNOSIS — M19071 Primary osteoarthritis, right ankle and foot: Secondary | ICD-10-CM

## 2018-04-26 DIAGNOSIS — M19072 Primary osteoarthritis, left ankle and foot: Secondary | ICD-10-CM

## 2018-04-26 DIAGNOSIS — M19041 Primary osteoarthritis, right hand: Secondary | ICD-10-CM

## 2018-04-26 DIAGNOSIS — M79641 Pain in right hand: Secondary | ICD-10-CM

## 2018-04-26 DIAGNOSIS — M19042 Primary osteoarthritis, left hand: Secondary | ICD-10-CM

## 2018-04-26 DIAGNOSIS — Z8639 Personal history of other endocrine, nutritional and metabolic disease: Secondary | ICD-10-CM

## 2018-04-26 DIAGNOSIS — M797 Fibromyalgia: Secondary | ICD-10-CM

## 2018-04-26 DIAGNOSIS — Z5181 Encounter for therapeutic drug level monitoring: Secondary | ICD-10-CM

## 2018-04-26 DIAGNOSIS — M5136 Other intervertebral disc degeneration, lumbar region: Secondary | ICD-10-CM

## 2018-04-26 DIAGNOSIS — Z8669 Personal history of other diseases of the nervous system and sense organs: Secondary | ICD-10-CM

## 2018-04-26 DIAGNOSIS — M5030 Other cervical disc degeneration, unspecified cervical region: Secondary | ICD-10-CM

## 2018-04-26 DIAGNOSIS — R5383 Other fatigue: Secondary | ICD-10-CM

## 2018-04-26 DIAGNOSIS — M79642 Pain in left hand: Secondary | ICD-10-CM

## 2018-04-27 ENCOUNTER — Telehealth: Payer: Self-pay | Admitting: Rheumatology

## 2018-04-27 NOTE — Telephone Encounter (Signed)
I LMOM for patient to call, and schedule her next 6 month follow up appt with [name], PAC.

## 2018-04-27 NOTE — Telephone Encounter (Signed)
-----   Message from [place] sent at 04/26/2018  1:27 PM EDT ----- Patient had virtual visit today with Dr. Estanislado Pandy. Please call to schedule 6 month follow up. Thanks!

## 2018-05-03 DIAGNOSIS — G43709 Chronic migraine without aura, not intractable, without status migrainosus: Secondary | ICD-10-CM | POA: Diagnosis not present

## 2018-05-28 ENCOUNTER — Other Ambulatory Visit: Payer: Self-pay | Admitting: Physician Assistant

## 2018-05-29 ENCOUNTER — Other Ambulatory Visit: Payer: Self-pay

## 2018-05-29 MED ORDER — ALPRAZOLAM 0.5 MG PO TABS: ORAL_TABLET | ORAL | 0 refills | Status: DC

## 2018-05-29 NOTE — Telephone Encounter (Addendum)
Last Visit: 04/26/18 Next Visit: 10/31/18 UDS: 10/27/17 Narc agreement: 10/27/17  Attempted to contact the patient to advise she needs to update UDS and narc agreement. Patient states she has severe asthma and does not want to come to the office to update her UDS. She states she has not been leaving her house at this time. Patient states she will update narc agreement.   Okay to refill Tramadol?

## 2018-06-07 IMAGING — MG MM DIGITAL DIAGNOSTIC UNILAT*R* W/ TOMO W/ CAD
6 series · 6 of 14 positions shown · non-contrast
Comparison: Multiple prior studies including the most recent
10/06/2015

CLINICAL DATA: Patient returns after screening study for evaluation
of possible right breast distortion.

EXAM:
2D DIGITAL DIAGNOSTIC UNILATERAL RIGHT MAMMOGRAM WITH CAD AND
ADJUNCT TOMO

[R MLO]
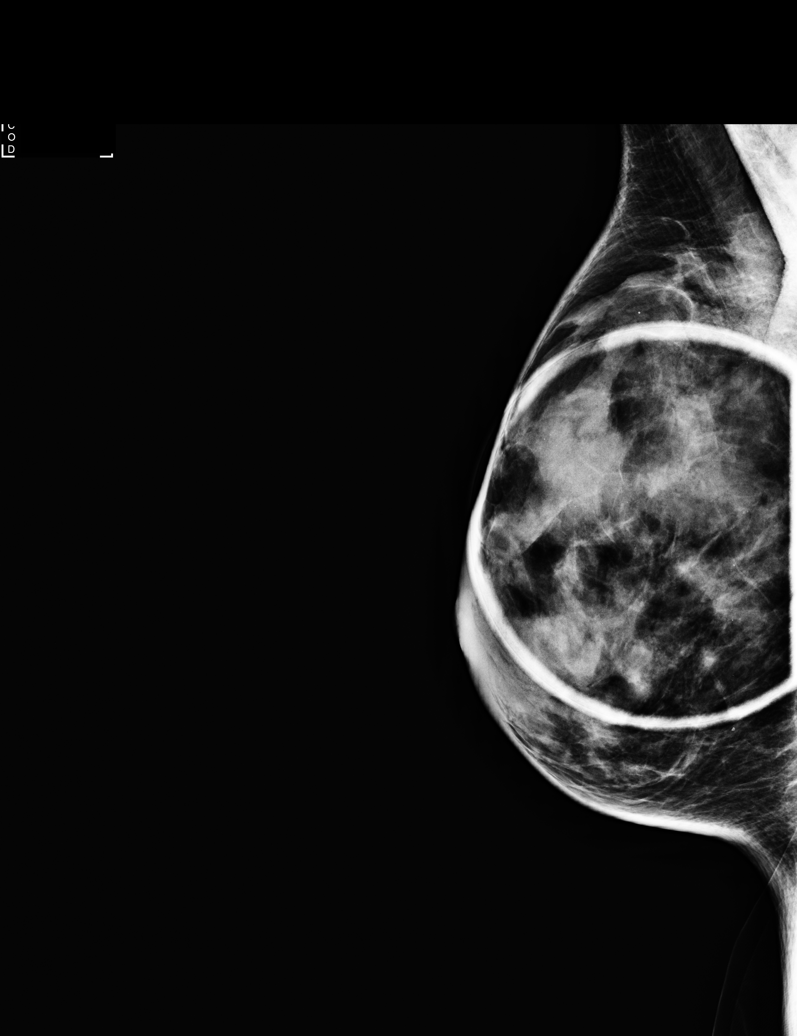

[R MLO synth-2D]
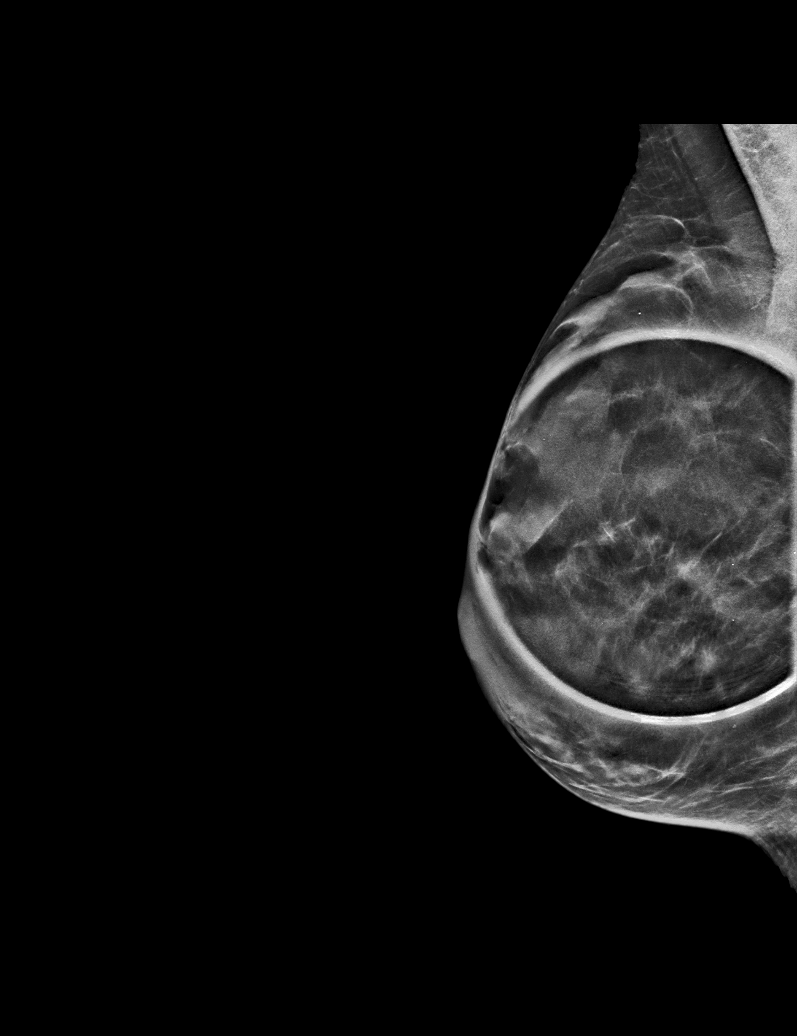

[R ML synth-2D]
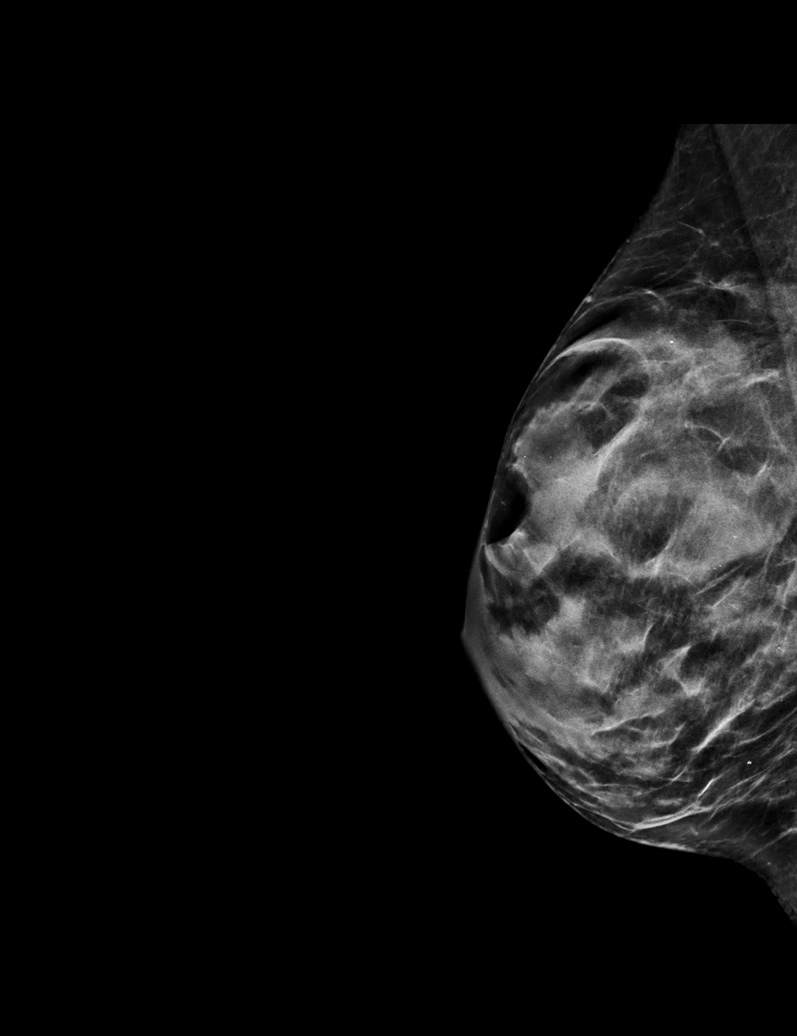

[R ML]
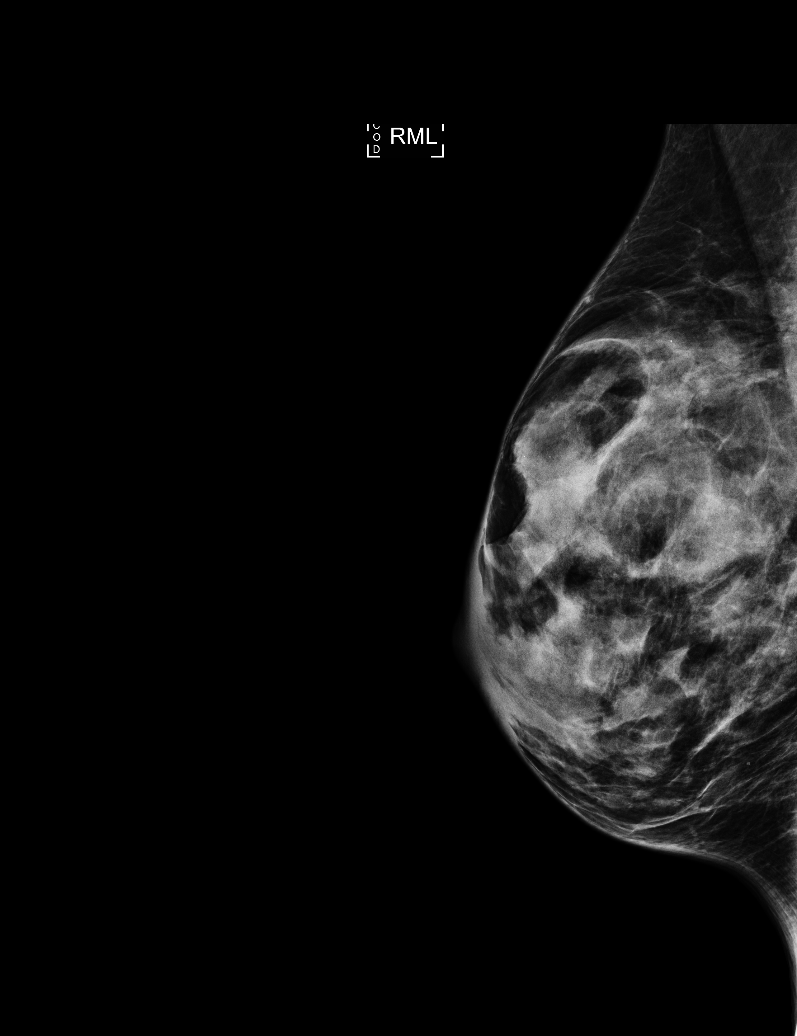

[R ML tomo · tomo slice 31/60.0]
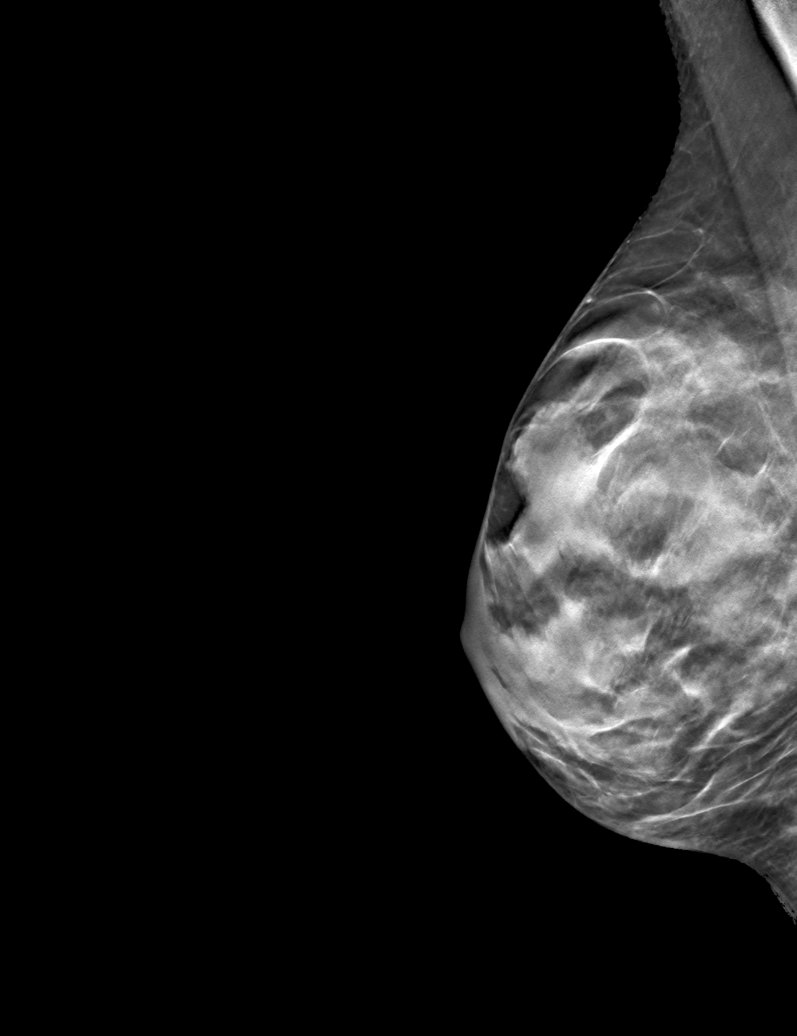

[R MLO tomo · tomo slice 27/52.0]
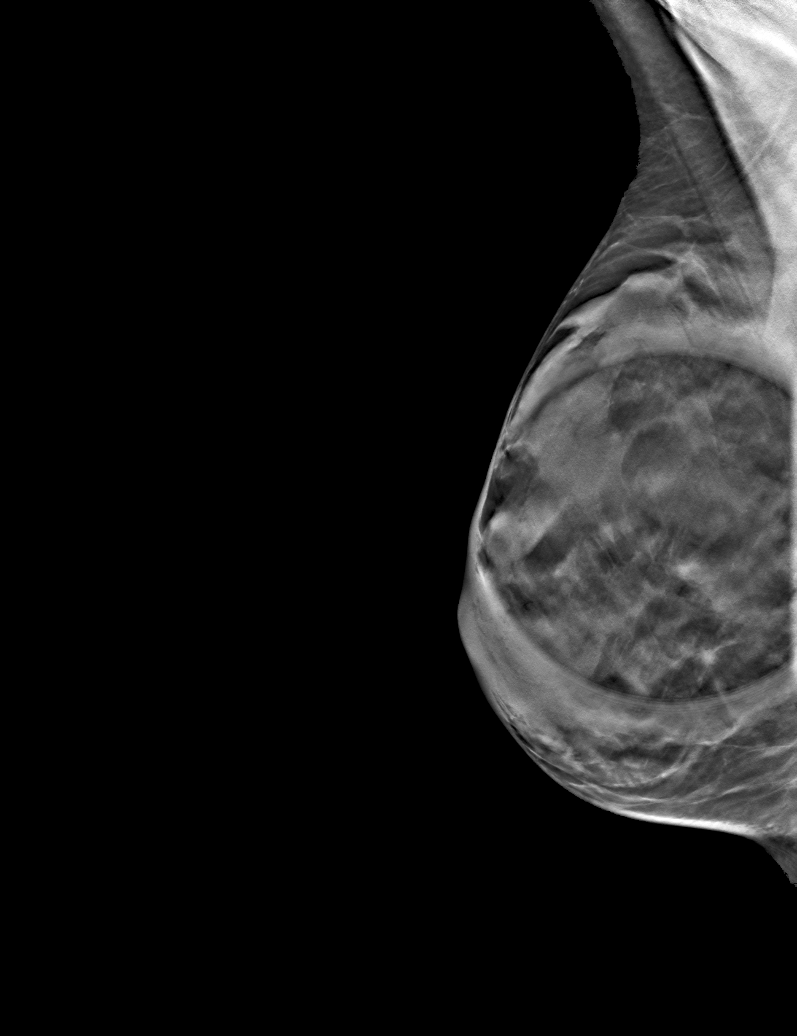

[6 of 14 positions shown; findings below may reference images not displayed]

ACR Breast Density Category c: The breast tissue is heterogeneously
dense, which may obscure small masses.
FINDINGS: Additional views are performed, demonstrating no persistent
abnormality in the central portion of the right breast. No
suspicious mass, distortion, or microcalcifications are identified
to suggest presence of malignancy.

Mammographic images were processed with CAD.
IMPRESSION: No mammographic evidence for malignancy.

RECOMMENDATION:
Screening mammogram in one year.(Code:JC-W-DG7)

I have discussed the findings and recommendations with the patient.
Results were also provided in writing at the conclusion of the
visit. If applicable, a reminder letter will be sent to the patient
regarding the next appointment.

BI-RADS CATEGORY  1: Negative.

## 2018-06-08 ENCOUNTER — Encounter: Payer: Self-pay | Admitting: Psychiatry

## 2018-06-08 ENCOUNTER — Ambulatory Visit (INDEPENDENT_AMBULATORY_CARE_PROVIDER_SITE_OTHER): Payer: Medicare Other | Admitting: Psychiatry

## 2018-06-08 ENCOUNTER — Other Ambulatory Visit: Payer: Self-pay

## 2018-06-08 DIAGNOSIS — F4010 Social phobia, unspecified: Secondary | ICD-10-CM | POA: Diagnosis not present

## 2018-06-08 DIAGNOSIS — F900 Attention-deficit hyperactivity disorder, predominantly inattentive type: Secondary | ICD-10-CM | POA: Diagnosis not present

## 2018-06-08 DIAGNOSIS — G4710 Hypersomnia, unspecified: Secondary | ICD-10-CM

## 2018-06-08 DIAGNOSIS — F331 Major depressive disorder, recurrent, moderate: Secondary | ICD-10-CM

## 2018-06-08 DIAGNOSIS — F411 Generalized anxiety disorder: Secondary | ICD-10-CM

## 2018-06-08 NOTE — Progress Notes (Signed)
Lauren Bradley [id number] December 11, 1950 68 y.o.  Virtual Visit via Telephone Note  I connected with pt by telephone and verified that I am speaking with the correct person using two identifiers.   I discussed the limitations, risks, security and privacy concerns of performing an evaluation and management service by telephone and the availability of in person appointments. I also discussed with the patient that there may be a patient responsible charge related to this service. The patient expressed understanding and agreed to proceed.  I discussed the assessment and treatment plan with the patient. The patient was provided an opportunity to ask questions and all were answered. The patient agreed with the plan and demonstrated an understanding of the instructions.   The patient was advised to call back or seek an in-person evaluation if the symptoms worsen or if the condition fails to improve as anticipated.  I provided 40 minutes of non-face-to-face time during this encounter. The call started at 120 and ended at 2:00. The patient was located at home and the provider was located off.  Subjective:   Patient ID:  Lauren Bradley is a 68 y.o. (DOB 08-01-1950) female.  Chief Complaint:  Chief Complaint  Patient presents with  . Follow-up    Medication Management  . Depression    Medication Management    Depression         Associated symptoms include myalgias and headaches.  Associated symptoms include no decreased concentration and no suicidal ideas.  [name] presents  today for follow-up of depression, anxiety and insomnia and chronic pain.   Last seen in January.  No meds were changed at that visit.  Couldn't afford Rexulti.  Got Abilify up to 2 mg daily.  Something I've always needed but didn't have.  Maybe gained 1-5#.  All at the waist.  Doesn't expect to keep gaining weight.  Still some days can't focus.  Helped ADHD.  Function is better.  Still some days flounder.  Not as good  as paperwork years ago but better with Abilify.  Helped having [name] at home.  Was poor function before Abilify despite trying hard.   Abilify helped the depression clearly and very beneficial.  Not always sad now.  HA resolved.  Initially it caused great energy like a motor running and reduced need for sleep to 5-6 hours.  That has resolved.  SE weight gain without increasing calories.  Changed her sleep schedule for the better.  To bed earlier and up earlier.    I didn't realize how depressed I was.  Better organization and function and productivity.  Abilify helped regulate and correct her sleep wake schedule.  Not oversleeping now.  Anxiety remains high chronically.  QT has been good for her.  Chronic social anxiety.  Can be crippled from anxiety.    Mother compulsively and annoyingly call her repeatedly.  Feels shame over the feelings she has about her mother.  Major boundary issues from her mother.  Mother was neglectful in her childhood.  Recognizes some blacked out memories from her childhood.  Some social anxiety still significant.  Past Psychiatric Medication Trials: Abilify 2 mg helpful, duloxetine 90, venlafaxine irritable, Belsomra, Lunesta, Ambien, trazodone, alprazolam, doxepin, mirtazapine, buspirone side effects, try cyclic antidepressants, modafinil, Vyvanse 30, Provigil, Ritalin, Adderall AG, Wellbutrin, Zoloft, Paxil, propranolol, Ambien   Review of Systems:  Review of Systems  Musculoskeletal: Positive for arthralgias, back pain and myalgias.  Neurological: Positive for headaches. Negative for tremors and weakness.  Psychiatric/Behavioral:  Positive for depression. Negative for agitation, behavioral problems, confusion, decreased concentration, dysphoric mood, hallucinations, self-injury, sleep disturbance and suicidal ideas. The patient is nervous/anxious. The patient is not hyperactive.     Medications: I have reviewed the patient's current medications.  Current Outpatient  Medications  Medication Sig Dispense Refill  . ALPRAZolam (XANAX) 0.5 MG tablet TAKE 2 TABLETS BY MOUTH EVERY NIGHT AT BEDTIME AND 1 TABLET EVERY DAY AS NEEDED 75 tablet 0  . ARIPiprazole (ABILIFY) 2 MG tablet Take 0.5 tablets (1 mg total) by mouth every morning. (Patient taking differently: Take 2 mg by mouth daily. ) 90 tablet 0  . calcium carbonate (CALCIUM 600) 600 MG TABS tablet Take 600 mg by mouth.    . diphenhydrAMINE (BENADRYL) 50 MG capsule Take 50 mg by mouth every 6 (six) hours as needed.    . DULoxetine (CYMBALTA) 30 MG capsule Take 1 capsule (30 mg total) by mouth daily. 90 capsule 0  . DULoxetine (CYMBALTA) 60 MG capsule Take 1 capsule (60 mg total) by mouth daily. Takes 30 mg and 60 mg to total daily dose of 90 mg 90 capsule 0  . estradiol (VIVELLE-DOT) 0.025 MG/24HR Place 1 patch onto the skin 2 (two) times a week.     . fluticasone (FLONASE) 50 MCG/ACT nasal spray     . fluticasone (FLOVENT HFA) 110 MCG/ACT inhaler Inhale into the lungs as needed.     . Levomefolate Glucosamine (METHYLFOLATE PO) Take by mouth.    Marland Kitchen MAGNESIUM MALATE PO Take by mouth daily.    . modafinil (PROVIGIL) 100 MG tablet Take 1 tablet (100 mg total) by mouth daily. 30 tablet 0  . NP THYROID 15 MG tablet     . progesterone (PROMETRIUM) 200 MG capsule Take by mouth.    . SUMAtriptan (IMITREX) 100 MG tablet TAKE 1 TABLET BY MOUTH AT ONSET OF HEADACHE. MAY REPEAT IN 2 HOURS. MAX 2 TABLETS PER DAY    . traMADol (ULTRAM) 50 MG tablet TAKE 1 TABLET(50 MG) BY MOUTH THREE TIMES DAILY AS NEEDED 90 tablet 0  . Vitamin D, Ergocalciferol, (DRISDOL) 50000 units CAPS capsule Take 50,000 Units by mouth every 7 (seven) days.      No current facility-administered medications for this visit.     Medication Side Effects: HA stopped with Abilify, weight gain  Allergies:  Allergies  Allergen Reactions  . Hydrocodone Itching  . Codeine Nausea And Vomiting  . Other   . Sulfa Antibiotics   . Cephalosporins Rash  .  Sulfamethoxazole Rash    Unknown reaction    Past Medical History:  Diagnosis Date  . Asthma   . Fatigue   . Infertility, female     Family History  Problem Relation Age of Onset  . Fibromyalgia Mother   . Kidney failure Mother   . Mental illness Mother   . Heart disease Father   . COPD Father   . Cancer Father        colon, liver   . Hypothyroidism Son     Social History   Socioeconomic History  . Marital status: Single    Spouse name: Not on file  . Number of children: Not on file  . Years of education: Not on file  . Highest education level: Not on file  Occupational History  . Not on file  Social Needs  . Financial resource strain: Not on file  . Food insecurity:    Worry: Not on file    Inability: Not  on file  . Transportation needs:    Medical: Not on file    Non-medical: Not on file  Tobacco Use  . Smoking status: Never Smoker  . Smokeless tobacco: Never Used  Substance and Sexual Activity  . Alcohol use: Yes    Comment: "Very little"  . Drug use: No  . Sexual activity: Not on file  Lifestyle  . Physical activity:    Days per week: Not on file    Minutes per session: Not on file  . Stress: Not on file  Relationships  . Social connections:    Talks on phone: Not on file    Gets together: Not on file    Attends religious service: Not on file    Active member of club or organization: Not on file    Attends meetings of clubs or organizations: Not on file    Relationship status: Not on file  . Intimate partner violence:    Fear of current or ex partner: Not on file    Emotionally abused: Not on file    Physically abused: Not on file    Forced sexual activity: Not on file  Other Topics Concern  . Not on file  Social History Narrative  . Not on file    Past Medical History, Surgical history, Social history, and Family history were reviewed and updated as appropriate.   Please see review of systems for further details on the patient's review  from today.   Objective:   Physical Exam:  There were no vitals taken for this visit.  Physical Exam Neurological:     Mental Status: She is alert and oriented to person, place, and time.     Cranial Nerves: No dysarthria.  Psychiatric:        Attention and Perception: Attention normal.        Mood and Affect: Mood is anxious. Mood is not depressed.        Speech: Speech normal.        Behavior: Behavior is cooperative.        Thought Content: Thought content normal. Thought content is not paranoid or delusional. Thought content does not include homicidal or suicidal ideation. Thought content does not include homicidal or suicidal plan.        Cognition and Memory: Cognition and memory normal.        Judgment: Judgment normal.     Comments: Insight fair to good.  Depression has almost resolved with the Abilify for the most part.  Anxiety remains high.     Lab Review:     Component Value Date/Time   NA 137 02/21/2017 1635   K 4.3 02/21/2017 1635   CL 102 02/21/2017 1635   CO2 29 02/21/2017 1635   GLUCOSE 88 02/21/2017 1635   BUN 16 02/21/2017 1635   CREATININE 0.58 02/21/2017 1635   CALCIUM 9.4 02/21/2017 1635   PROT 6.8 02/21/2017 1635   ALBUMIN 4.4 12/30/2015 1507   AST 22 02/21/2017 1635   ALT 20 02/21/2017 1635   ALKPHOS 28 (L) 12/30/2015 1507   BILITOT 0.3 02/21/2017 1635   GFRNONAA 97 02/21/2017 1635   GFRAA 112 02/21/2017 1635       Component Value Date/Time   WBC 6.6 02/21/2017 1635   RBC 4.36 02/21/2017 1635   HGB 12.9 02/21/2017 1635   HCT 38.2 02/21/2017 1635   PLT 327 02/21/2017 1635   MCV 87.6 02/21/2017 1635   MCH 29.6 02/21/2017 1635   MCHC 33.8  02/21/2017 1635   RDW 12.4 02/21/2017 1635   LYMPHSABS 1,228 02/21/2017 1635   MONOABS 402 12/30/2015 1507   EOSABS 132 02/21/2017 1635   BASOSABS 73 02/21/2017 1635    No results found for: POCLITH, LITHIUM   No results found for: PHENYTOIN, PHENOBARB, VALPROATE, CBMZ   .res Assessment: Plan:     Major depressive disorder, recurrent episode, moderate (HCC)  Attention deficit hyperactivity disorder (ADHD), predominantly inattentive type  Generalized anxiety disorder  Hypersomnolence disorder, persistent  Social anxiety disorder   Less depressed with the Abilify but HA.  She is working the dosage up more slowly and is tolerating it better. Goal of 2 mg daily and she achieved it and it has helped depression and productivity but not anxiety.  Option for anxiety is increase the Abilify  Option check labs and Hgb A1C bc in past was prediabetic.  Disc the option of metformin.  We will defer until Covid dies down and then we will check hemoglobin A1c  We discussed the short-term risks associated with benzodiazepines including sedation and increased fall risk among others.  Discussed long-term side effect risk including dependence, potential withdrawal symptoms, and the potential eventual dose-related risk of dementia. Try reducing Xanax for sleep if possible.  However insomnia is also a risk factor for dementia.  Discussed potential metabolic side effects associated with atypical antipsychotics, as well as potential risk for movement side effects. Advised pt to contact office if movement side effects occur.   Discussed the alternative of pramipexole off label  Later could consider reducing duloxetine for cost reasons.  Answered questions about types of anti-acid meds and risk dementia.  Stress of dealing with the lying and manipulation of her mother.  Disc this in therapy. And Family problems.  This is a chronic problem.  Remains very stressed with mother.  "Driving me out of my mind!"  Disc this in detail and how to handle her manipulation and hypochondriasis.  Having to help her a lot.  Disc limit setting and boundaries.  45 min appt  FU 8 weeks  Lynder Parents, MD, DFAPA   Please see After Visit Summary for patient specific instructions.  Future Appointments  Date Time  Provider [place]  10/31/2018  3:00 PM [name], MD [facility] None    No orders of the defined types were placed in this encounter.     -------------------------------

## 2018-06-21 ENCOUNTER — Other Ambulatory Visit: Payer: Self-pay | Admitting: Psychiatry

## 2018-06-27 ENCOUNTER — Other Ambulatory Visit: Payer: Self-pay

## 2018-06-27 ENCOUNTER — Telehealth: Payer: Self-pay | Admitting: Psychiatry

## 2018-06-27 MED ORDER — ARIPIPRAZOLE 2 MG PO TABS: 2.0000 mg | ORAL_TABLET | Freq: Every day | ORAL | 0 refills | Status: DC

## 2018-06-27 NOTE — Telephone Encounter (Signed)
Please refill Abilify 2.0mg  - She is taking 1- 2.0mg  tablet daily. Please refill [facility] on file.

## 2018-06-27 NOTE — Telephone Encounter (Signed)
Refill submitted. 

## 2018-07-25 DIAGNOSIS — J069 Acute upper respiratory infection, unspecified: Secondary | ICD-10-CM | POA: Diagnosis not present

## 2018-08-02 DIAGNOSIS — G43709 Chronic migraine without aura, not intractable, without status migrainosus: Secondary | ICD-10-CM | POA: Diagnosis not present

## 2018-08-07 DIAGNOSIS — M79642 Pain in left hand: Secondary | ICD-10-CM | POA: Diagnosis not present

## 2018-08-07 DIAGNOSIS — M25512 Pain in left shoulder: Secondary | ICD-10-CM | POA: Diagnosis not present

## 2018-08-07 DIAGNOSIS — M1812 Unilateral primary osteoarthritis of first carpometacarpal joint, left hand: Secondary | ICD-10-CM | POA: Diagnosis not present

## 2018-08-07 DIAGNOSIS — M1811 Unilateral primary osteoarthritis of first carpometacarpal joint, right hand: Secondary | ICD-10-CM | POA: Diagnosis not present

## 2018-08-07 DIAGNOSIS — M79641 Pain in right hand: Secondary | ICD-10-CM | POA: Diagnosis not present

## 2018-08-24 DIAGNOSIS — Z0000 Encounter for general adult medical examination without abnormal findings: Secondary | ICD-10-CM | POA: Diagnosis not present

## 2018-08-24 DIAGNOSIS — K219 Gastro-esophageal reflux disease without esophagitis: Secondary | ICD-10-CM | POA: Diagnosis not present

## 2018-08-24 DIAGNOSIS — Z23 Encounter for immunization: Secondary | ICD-10-CM | POA: Diagnosis not present

## 2018-08-24 DIAGNOSIS — Z1389 Encounter for screening for other disorder: Secondary | ICD-10-CM | POA: Diagnosis not present

## 2018-08-24 DIAGNOSIS — Z5181 Encounter for therapeutic drug level monitoring: Secondary | ICD-10-CM | POA: Diagnosis not present

## 2018-08-24 DIAGNOSIS — E611 Iron deficiency: Secondary | ICD-10-CM | POA: Diagnosis not present

## 2018-08-24 DIAGNOSIS — R499 Unspecified voice and resonance disorder: Secondary | ICD-10-CM | POA: Diagnosis not present

## 2018-08-24 DIAGNOSIS — J4520 Mild intermittent asthma, uncomplicated: Secondary | ICD-10-CM | POA: Diagnosis not present

## 2018-08-24 DIAGNOSIS — E039 Hypothyroidism, unspecified: Secondary | ICD-10-CM | POA: Diagnosis not present

## 2018-08-24 DIAGNOSIS — Z13220 Encounter for screening for lipoid disorders: Secondary | ICD-10-CM | POA: Diagnosis not present

## 2018-08-24 DIAGNOSIS — M797 Fibromyalgia: Secondary | ICD-10-CM | POA: Diagnosis not present

## 2018-08-29 DIAGNOSIS — E039 Hypothyroidism, unspecified: Secondary | ICD-10-CM | POA: Diagnosis not present

## 2018-08-29 DIAGNOSIS — Z5181 Encounter for therapeutic drug level monitoring: Secondary | ICD-10-CM | POA: Diagnosis not present

## 2018-08-29 DIAGNOSIS — Z79899 Other long term (current) drug therapy: Secondary | ICD-10-CM | POA: Diagnosis not present

## 2018-08-29 DIAGNOSIS — E611 Iron deficiency: Secondary | ICD-10-CM | POA: Diagnosis not present

## 2018-08-31 ENCOUNTER — Encounter: Payer: Self-pay | Admitting: Psychiatry

## 2018-08-31 ENCOUNTER — Ambulatory Visit (INDEPENDENT_AMBULATORY_CARE_PROVIDER_SITE_OTHER): Payer: Medicare Other | Admitting: Psychiatry

## 2018-08-31 ENCOUNTER — Other Ambulatory Visit: Payer: Self-pay

## 2018-08-31 DIAGNOSIS — F331 Major depressive disorder, recurrent, moderate: Secondary | ICD-10-CM

## 2018-08-31 DIAGNOSIS — F4010 Social phobia, unspecified: Secondary | ICD-10-CM | POA: Diagnosis not present

## 2018-08-31 DIAGNOSIS — F411 Generalized anxiety disorder: Secondary | ICD-10-CM | POA: Diagnosis not present

## 2018-08-31 DIAGNOSIS — F900 Attention-deficit hyperactivity disorder, predominantly inattentive type: Secondary | ICD-10-CM

## 2018-08-31 DIAGNOSIS — G4710 Hypersomnia, unspecified: Secondary | ICD-10-CM

## 2018-08-31 MED ORDER — ARIPIPRAZOLE 5 MG PO TABS: 2.5000 mg | ORAL_TABLET | Freq: Every day | ORAL | 0 refills | Status: DC

## 2018-08-31 NOTE — Progress Notes (Signed)
[name] [id number] December 05, 1950 68 y.o.  Virtual Visit via Telephone Note  I connected with pt by telephone and verified that I am speaking with the correct person using two identifiers.   I discussed the limitations, risks, security and privacy concerns of performing an evaluation and management service by telephone and the availability of in person appointments. I also discussed with the patient that there may be a patient responsible charge related to this service. The patient expressed understanding and agreed to proceed.  I discussed the assessment and treatment plan with the patient. The patient was provided an opportunity to ask questions and all were answered. The patient agreed with the plan and demonstrated an understanding of the instructions.   The patient was advised to call back or seek an in-person evaluation if the symptoms worsen or if the condition fails to improve as anticipated.  I provided 45 minutes of non-face-to-face time during this encounter. The call started at 314 and ended at 4:00. The patient was located at home and the provider was located off.  Subjective:   Patient ID:  Lauren Bradley is a 68 y.o. (DOB [birth year]) female.  Chief Complaint:  Chief Complaint  Patient presents with  . Follow-up    Medication Management  . Depression    Medication Management  . Sleeping Problem  . Anxiety    Depression        Associated symptoms include myalgias and headaches.  Associated symptoms include no decreased concentration and no suicidal ideas.  [name] presents  today for follow-up of depression, anxiety and insomnia and chronic pain.   Last visit Jun 08, 2018.  No major meds were changed except she was trying to gradually increase the Abilify to 2 mg daily for optimal mood effect.  However headaches were limiting the pace at which she could adjust the dosage.  Got to 2 mg Abilify and concerned about wt gain around the middle.  Very conflicted.   She really cut back food.  A1C 5.7.  Was able to reduce her weight.  Worry over her glucose.  I feel much better on the Abilify.  Makes my brain feel more normal and focus is better and calmer overall.  Afraid of DM.  She thinks she would feel best at 3 mg.  Today anxiety and less productive than usual.    Couldn't afford Rexulti.  Got Abilify up to 2 mg daily.  Something I've always needed but didn't have.  Maybe gained 1-5#.  All at the waist.  Doesn't expect to keep gaining weight.  Still some days can't focus.  Helped ADHD.  Function is better.  Still some days flounder.  Not as good as paperwork years ago but better with Abilify.  Helped having [name] at home.  Was poor function before Abilify despite trying hard.   Abilify helped the depression clearly and very beneficial.  Not always sad now.  HA resolved.  Initially it caused great energy like a motor running and reduced need for sleep to 5-6 hours.  That has resolved.  SE weight gain without increasing calories.  Changed her sleep schedule for the better.  To bed earlier and up earlier.    I didn't realize how depressed I was.  Better organization and function and productivity.  Abilify helped regulate and correct her sleep wake schedule.  Not oversleeping now.  Anxiety remains high chronically.  QT has been good for her.  Chronic social anxiety.  Can be crippled  from anxiety.    Stressed still dealing with hypochondriac mother with constant somatization and complaining and anxiety and frequently wanting to go to the ER.  Disc management with this. Mother compulsively and annoyingly call her repeatedly.  Feels shame over the feelings she has about her mother.  Major boundary issues from her mother.  Mother was neglectful in her childhood.  Recognizes some blacked out memories from her childhood.  Asked how to deal with this with mother.  Some social anxiety still significant.  Past Psychiatric Medication Trials: Abilify 2 mg helpful, duloxetine  90, venlafaxine irritable, Belsomra, Lunesta, Ambien, trazodone, alprazolam, doxepin, mirtazapine, buspirone side effects, try cyclic antidepressants, modafinil, Vyvanse 30, Provigil, Ritalin, Adderall AG, Wellbutrin, Zoloft, Paxil, propranolol, Ambien  Review of Systems:  Review of Systems  Musculoskeletal: Positive for arthralgias, back pain and myalgias.  Neurological: Positive for headaches. Negative for tremors and weakness.  Psychiatric/Behavioral: Positive for depression. Negative for agitation, behavioral problems, confusion, decreased concentration, dysphoric mood, hallucinations, self-injury, sleep disturbance and suicidal ideas. The patient is nervous/anxious. The patient is not hyperactive.     Medications: I have reviewed the patient's current medications.  Current Outpatient Medications  Medication Sig Dispense Refill  . ALPRAZolam (XANAX) 0.5 MG tablet TAKE 2 TABLETS BY MOUTH EVERY NIGHT AT BEDTIME AND 1 TABLET EVERY DAY AS NEEDED 75 tablet 0  . ARIPiprazole (ABILIFY) 2 MG tablet Take 1 tablet (2 mg total) by mouth daily. 90 tablet 0  . calcium carbonate (CALCIUM 600) 600 MG TABS tablet Take 600 mg by mouth.    . diphenhydrAMINE (BENADRYL) 50 MG capsule Take 50 mg by mouth every 6 (six) hours as needed.    . DULoxetine (CYMBALTA) 30 MG capsule TAKE 1 CAPSULE(30 MG) BY MOUTH DAILY 90 capsule 0  . DULoxetine (CYMBALTA) 60 MG capsule TAKE ONE CAPSULE BY MOUTH DAILY**TAKE WITH ONE 30MG  CAPSULE OF DULOXETINE 90 capsule 0  . estradiol (VIVELLE-DOT) 0.025 MG/24HR Place 1 patch onto the skin 2 (two) times a week.     . fluticasone (FLONASE) 50 MCG/ACT nasal spray     . fluticasone (FLOVENT HFA) 110 MCG/ACT inhaler Inhale into the lungs as needed.     . Levomefolate Glucosamine (METHYLFOLATE PO) Take by mouth.    Marland Kitchen MAGNESIUM MALATE PO Take by mouth daily.    . modafinil (PROVIGIL) 100 MG tablet Take 1 tablet (100 mg total) by mouth daily. 30 tablet 0  . NP THYROID 15 MG tablet     .  progesterone (PROMETRIUM) 200 MG capsule Take by mouth.    . SUMAtriptan (IMITREX) 100 MG tablet TAKE 1 TABLET BY MOUTH AT ONSET OF HEADACHE. MAY REPEAT IN 2 HOURS. MAX 2 TABLETS PER DAY    . traMADol (ULTRAM) 50 MG tablet TAKE 1 TABLET(50 MG) BY MOUTH THREE TIMES DAILY AS NEEDED 90 tablet 0  . Vitamin D, Ergocalciferol, (DRISDOL) 50000 units CAPS capsule Take 50,000 Units by mouth every 7 (seven) days.     . ARIPiprazole (ABILIFY) 5 MG tablet Take 0.5 tablets (2.5 mg total) by mouth daily. 45 tablet 0   No current facility-administered medications for this visit.     Medication Side Effects: HA stopped with Abilify, weight gain, hungry is odd  Allergies:  Allergies  Allergen Reactions  . Hydrocodone Itching  . Codeine Nausea And Vomiting  . Other   . Sulfa Antibiotics   . Cephalosporins Rash  . Sulfamethoxazole Rash    Unknown reaction    Past Medical History:  Diagnosis  Date  . Asthma   . Fatigue   . Infertility, female     Family History  Problem Relation Age of Onset  . Fibromyalgia Mother   . Kidney failure Mother   . Mental illness Mother   . Heart disease Father   . COPD Father   . Cancer Father        colon, liver   . Hypothyroidism Son     Social History   Socioeconomic History  . Marital status: Single    Spouse name: Not on file  . Number of children: Not on file  . Years of education: Not on file  . Highest education level: Not on file  Occupational History  . Not on file  Social Needs  . Financial resource strain: Not on file  . Food insecurity    Worry: Not on file    Inability: Not on file  . Transportation needs    Medical: Not on file    Non-medical: Not on file  Tobacco Use  . Smoking status: Never Smoker  . Smokeless tobacco: Never Used  Substance and Sexual Activity  . Alcohol use: Yes    Comment: "Very little"  . Drug use: No  . Sexual activity: Not on file  Lifestyle  . Physical activity    Days per week: Not on file     Minutes per session: Not on file  . Stress: Not on file  Relationships  . Social Herbalist on phone: Not on file    Gets together: Not on file    Attends religious service: Not on file    Active member of club or organization: Not on file    Attends meetings of clubs or organizations: Not on file    Relationship status: Not on file  . Intimate partner violence    Fear of current or ex partner: Not on file    Emotionally abused: Not on file    Physically abused: Not on file    Forced sexual activity: Not on file  Other Topics Concern  . Not on file  Social History Narrative  . Not on file    Past Medical History, Surgical history, Social history, and Family history were reviewed and updated as appropriate.   Please see review of systems for further details on the patient's review from today.   Objective:   Physical Exam:  There were no vitals taken for this visit.  Physical Exam Neurological:     Mental Status: She is alert and oriented to person, place, and time.     Cranial Nerves: No dysarthria.  Psychiatric:        Attention and Perception: Attention normal.        Mood and Affect: Mood is anxious. Mood is not depressed.        Speech: Speech normal.        Behavior: Behavior is cooperative.        Thought Content: Thought content normal. Thought content is not paranoid or delusional. Thought content does not include homicidal or suicidal ideation. Thought content does not include homicidal or suicidal plan.        Cognition and Memory: Cognition and memory normal.        Judgment: Judgment normal.     Comments: Insight fair to good.  Depression has almost resolved with the Abilify for the most part.  Anxiety remains high.     Lab Review:     Component Value  Date/Time   NA 137 02/21/2017 1635   K 4.3 02/21/2017 1635   CL 102 02/21/2017 1635   CO2 29 02/21/2017 1635   GLUCOSE 88 02/21/2017 1635   BUN 16 02/21/2017 1635   CREATININE 0.58 02/21/2017  1635   CALCIUM 9.4 02/21/2017 1635   PROT 6.8 02/21/2017 1635   ALBUMIN 4.4 12/30/2015 1507   AST 22 02/21/2017 1635   ALT 20 02/21/2017 1635   ALKPHOS 28 (L) 12/30/2015 1507   BILITOT 0.3 02/21/2017 1635   GFRNONAA 97 02/21/2017 1635   GFRAA 112 02/21/2017 1635       Component Value Date/Time   WBC 6.6 02/21/2017 1635   RBC 4.36 02/21/2017 1635   HGB 12.9 02/21/2017 1635   HCT 38.2 02/21/2017 1635   PLT 327 02/21/2017 1635   MCV 87.6 02/21/2017 1635   MCH 29.6 02/21/2017 1635   MCHC 33.8 02/21/2017 1635   RDW 12.4 02/21/2017 1635   LYMPHSABS 1,228 02/21/2017 1635   MONOABS 402 12/30/2015 1507   EOSABS 132 02/21/2017 1635   BASOSABS 73 02/21/2017 1635    No results found for: POCLITH, LITHIUM   No results found for: PHENYTOIN, PHENOBARB, VALPROATE, CBMZ   .res Assessment: Plan:    Major depressive disorder, recurrent episode, moderate (HCC)  Attention deficit hyperactivity disorder (ADHD), predominantly inattentive type  Generalized anxiety disorder  Hypersomnolence disorder, persistent  Social anxiety disorder   Less depressed with the Abilify clear benefit for depression and anxiety.  She is working the dosage up more slowly and is tolerating it better.   Option for anxiety is increase the Abilify.  She'd like to do try to increase to 3 mg .  Disc glucose and A1c in 3 mos.   Option check labs and Hgb A1C bc in past was prediabetic.  Disc the option of metformin.  We will defer until Covid dies down and then we will check hemoglobin A1c .  Disc the pros/cons.   She'd like to hold off on metformin.  We discussed the short-term risks associated with benzodiazepines including sedation and increased fall risk among others.  Discussed long-term side effect risk including dependence, potential withdrawal symptoms, and the potential eventual dose-related risk of dementia. Try reducing Xanax for sleep if possible.  However insomnia is also a risk factor for dementia.   Disc rebound insomnia.  Consider reducing to protect the memory.  Discussed potential metabolic side effects associated with atypical antipsychotics, as well as potential risk for movement side effects. Advised pt to contact office if movement side effects occur.   Discussed the alternative of pramipexole off label  Later could consider reducing duloxetine for cost reasons.  Answered questions about types of anti-acid meds and risk dementia.  Stressed still dealing with hypochondriac mother with constant somatization and complaining and anxiety and frequently wanting to go to the ER.  Disc management with this. Stress of dealing with the lying and manipulation of her mother.  Disc this in therapy. And Family problems.  This is a chronic problem.  Remains very stressed with mother.  "Driving me out of my mind!"  Disc this in detail and how to handle her manipulation and hypochondriasis.  Having to help her a lot.  Disc limit setting and boundaries.  Disc regular scheduling PCP visits.    45 min appt  FU 8 weeks  Lynder Parents, MD, DFAPA   Please see After Visit Summary for patient specific instructions.  Future Appointments  Date Time Provider [place]  10/31/2018  3:00 PM Deveshwar, Abel Presto, MD [facility] None    No orders of the defined types were placed in this encounter.     -------------------------------

## 2018-09-14 DIAGNOSIS — M7542 Impingement syndrome of left shoulder: Secondary | ICD-10-CM | POA: Diagnosis not present

## 2018-09-15 ENCOUNTER — Other Ambulatory Visit: Payer: Self-pay | Admitting: Psychiatry

## 2018-09-26 ENCOUNTER — Other Ambulatory Visit: Payer: Self-pay | Admitting: Psychiatry

## 2018-09-27 ENCOUNTER — Other Ambulatory Visit: Payer: Self-pay

## 2018-09-27 ENCOUNTER — Telehealth: Payer: Self-pay | Admitting: Psychiatry

## 2018-09-27 MED ORDER — ARIPIPRAZOLE 2 MG PO TABS: 3.0000 mg | ORAL_TABLET | Freq: Every day | ORAL | 0 refills | Status: DC

## 2018-09-27 NOTE — Telephone Encounter (Signed)
Refill submitted for abilify 3 mg 90 day supply

## 2018-09-27 NOTE — Telephone Encounter (Signed)
Pt called need refill for Abilify 2 mg takes 1 1/2 daily. (total 3 mg). [facility]

## 2018-09-28 ENCOUNTER — Telehealth: Payer: Self-pay | Admitting: Psychiatry

## 2018-09-28 NOTE — Telephone Encounter (Signed)
Prior authorization approved for aripiprazole 2 mg take 1.5 tablets daily #135 through [facility] effective 01/11/2018-01/11/2019  Will notify patient and pharmacy

## 2018-09-28 NOTE — Telephone Encounter (Signed)
Called patient's pharmacy for clarification and her Abilify 2 mg 1.5 tablets requires a PA due to quantity. Will submit PA

## 2018-09-28 NOTE — Telephone Encounter (Signed)
Patient stated she needs to take 2 mg, instead of 3 mg, only has enough for 2 more days of tje Reprizall but insurance company is confused regarding her taking the 2 mg,

## 2018-10-03 DIAGNOSIS — M25512 Pain in left shoulder: Secondary | ICD-10-CM | POA: Diagnosis not present

## 2018-10-05 DIAGNOSIS — M25512 Pain in left shoulder: Secondary | ICD-10-CM | POA: Diagnosis not present

## 2018-10-10 DIAGNOSIS — M25512 Pain in left shoulder: Secondary | ICD-10-CM | POA: Diagnosis not present

## 2018-10-12 DIAGNOSIS — M25512 Pain in left shoulder: Secondary | ICD-10-CM | POA: Diagnosis not present

## 2018-10-17 NOTE — Progress Notes (Deleted)
Office Visit Note  Patient: Lauren Bradley             Date of Birth: [birth year]           MRN: [id number]             PCP: Lavone Orn, MD Referring: Lavone Orn, MD Visit Date: 10/31/2018 Occupation: @GUAROCC @  Subjective:  No chief complaint on file.   History of Present Illness: Lauren Bradley is a 68 y.o. female ***   Activities of Daily Living:  Patient reports morning stiffness for *** {minute/hour:19697}.   Patient {ACTIONS;DENIES/REPORTS:21021675::"Denies"} nocturnal pain.  Difficulty dressing/grooming: {ACTIONS;DENIES/REPORTS:21021675::"Denies"} Difficulty climbing stairs: {ACTIONS;DENIES/REPORTS:21021675::"Denies"} Difficulty getting out of chair: {ACTIONS;DENIES/REPORTS:21021675::"Denies"} Difficulty using hands for taps, buttons, cutlery, and/or writing: {ACTIONS;DENIES/REPORTS:21021675::"Denies"}  No Rheumatology ROS completed.   PMFS History:  Patient Active Problem List   Diagnosis Date Noted  . Major depressive disorder, single episode, severe ([place]) 10/06/2017  . History of vitamin D deficiency 10/12/2016  . History of seasonal allergies 12/29/2015  . Fibromyalgia 12/29/2015  . Other fatigue 12/29/2015  . Primary osteoarthritis of both hands 12/29/2015  . Primary osteoarthritis of both feet 12/29/2015  . DJD (degenerative joint disease), cervical 12/29/2015  . DDD (degenerative disc disease), lumbar 12/29/2015  . Osteopenia of multiple sites 12/29/2015  . History of migraine 12/29/2015    Past Medical History:  Diagnosis Date  . Asthma   . Fatigue   . Infertility, female     Family History  Problem Relation Age of Onset  . Fibromyalgia Mother   . Kidney failure Mother   . Mental illness Mother   . Heart disease Father   . COPD Father   . Cancer Father        colon, liver   . Hypothyroidism Son    Past Surgical History:  Procedure Laterality Date  . CESAREAN SECTION    . GALLBLADDER SURGERY     Social History   Social History  Narrative  . Not on file    There is no immunization history on file for this patient.   Objective: Vital Signs: There were no vitals taken for this visit.   Physical Exam   Musculoskeletal Exam: ***  CDAI Exam: CDAI Score: - Patient Global: -; Provider Global: - Swollen: -; Tender: - Joint Exam   No joint exam has been documented for this visit   There is currently no information documented on the homunculus. Go to the Rheumatology activity and complete the homunculus joint exam.  Investigation: No additional findings.  Imaging: No results found.  Recent Labs: Lab Results  Component Value Date   WBC 6.6 02/21/2017   HGB 12.9 02/21/2017   PLT 327 02/21/2017   NA 137 02/21/2017   K 4.3 02/21/2017   CL 102 02/21/2017   CO2 29 02/21/2017   GLUCOSE 88 02/21/2017   BUN 16 02/21/2017   CREATININE 0.58 02/21/2017   BILITOT 0.3 02/21/2017   ALKPHOS 28 (L) 12/30/2015   AST 22 02/21/2017   ALT 20 02/21/2017   PROT 6.8 02/21/2017   ALBUMIN 4.4 12/30/2015   CALCIUM 9.4 02/21/2017   GFRAA 112 02/21/2017    Speciality Comments: No specialty comments available.  Procedures:  No procedures performed Allergies: Hydrocodone, Codeine, Other, Sulfa antibiotics, Cephalosporins, and Sulfamethoxazole   Assessment / Plan:     Visit Diagnoses: No diagnosis found.  Orders: No orders of the defined types were placed in this encounter.  No orders of the defined types were placed in  this encounter.   Face-to-face time spent with patient was *** minutes. Greater than 50% of time was spent in counseling and coordination of care.  Follow-Up Instructions: No follow-ups on file.   Ofilia Neas, PA-C  Note - This record has been created using Dragon software.  Chart creation errors have been sought, but may not always  have been located. Such creation errors do not reflect on  the standard of medical care.

## 2018-10-19 DIAGNOSIS — M25512 Pain in left shoulder: Secondary | ICD-10-CM | POA: Diagnosis not present

## 2018-10-19 NOTE — Progress Notes (Signed)
Virtual Visit via Telephone Note  I connected with Lauren Bradley on 11/02/18 at  3:45 PM EDT by telephone and verified that I am speaking with the correct person using two identifiers.  Location: Patient: Home  Provider:Clinic  This service was conducted via virtual visit.   The patient was located at home. I was located in my office.  Consent was obtained prior to the virtual visit and is aware of possible charges through their insurance for this visit.  The patient is an established patient.  Dr. Estanislado Pandy, MD conducted the virtual visit and [name], PA-C acted as scribe during the service.  Office staff helped with scheduling follow up visits after the service was conducted.   I discussed the limitations, risks, security and privacy concerns of performing an evaluation and management service by telephone and the availability of in person appointments. I also discussed with the patient that there may be a patient responsible charge related to this service. The patient expressed understanding and agreed to proceed.  CC: Bilateral CMC joint pain  History of Present Illness: Patient is a 68 year old female with a past medical history of fibromyalgia, osteoarthritis, and DDD.   She states in April she fell and injured her left shoulder joint. She experiences instability due to chronic pain and deformity in both feet.  She wears braces and uses orthotics and supportive shoes. She is currently undergoing physical therapy for a torn left rotator cuff. She has noticed improvement and does not want to proceed with surgery at this time. She continues to have persistent CMC joint pain bilaterally.  She does not want to proceed with a [facility] joint replacement at this time.  She does not have lasting relief from cortisone injections.  She continues to follow up with Dr. [name].   Review of Systems  Constitutional: Negative for fever and malaise/fatigue.  Eyes: Negative for photophobia, pain, discharge and  redness.       +Eye dryness  Respiratory: Negative for cough, shortness of breath and wheezing.   Cardiovascular: Negative for chest pain and palpitations.  Gastrointestinal: Negative for blood in stool, constipation and diarrhea.  Genitourinary: Negative for dysuria.  Musculoskeletal: Positive for back pain, falls, joint pain and myalgias. Negative for neck pain.  Skin: Negative for rash.  Neurological: Negative for dizziness and headaches.  Psychiatric/Behavioral: Negative for depression. The patient is nervous/anxious. The patient does not have insomnia.       Observations/Objective: Physical Exam  Constitutional: She is oriented to person, place, and time.  Neurological: She is alert and oriented to person, place, and time.  Psychiatric: Mood, memory, affect and judgment normal.   Patient reports morning stiffness for 30 minutes.   Patient reports nocturnal pain.  Difficulty dressing/grooming: Denies Difficulty climbing stairs: Reports Difficulty getting out of chair: Reports Difficulty using hands for taps, buttons, cutlery, and/or writing: Reports   Assessment and Plan: Fibromyalgia-She has ongoing trochanteric bursitis bilaterally. She was encouraged to perform stretching exercises.  We will mail a handout of exercises to perform.  She is taking Cymbalta90 mg by mouth daily and tramadol 50 mg 1 tablet by mouth 3 times daily as needed for pain relief.  She has had more energy recently. We discussed the importance of regular exercise and good sleep hygiene.  She will follow up in 5 months.   Medication monitoring encounter - Tramadol 50 mg by mouth TID.UDS and narcotic agreement were updated on 10/27/2017.  Primary osteoarthritis of both hands:She has persistent CMC  joint pain bilaterally.  She is followed by Dr. [name].  She is not ready to proceed with a [facility] joint replacement yet.  Joint protection and muscle strengthening were discussed.   Primary osteoarthritis of  both feet: She sees a podiatrist in [place].  She wears braces, orthotics, and supportive shoes.  She experiences some instability    DDD (degenerative disc disease), cervical:She is not experiencing any neck pain at this time.  We will mail a handout of neck exercises to perform.   DDD (degenerative disc disease), lumbar:  She has chronic lower back pain, which is exacerbated by lifting heavy objects. We will mail a handout of back exercises to the patient.   Osteopenia of multiple sites: According to the patient she had a DEXA last year.  She is taking vitamin D 50,000 units by mouth once weekly.   History of rotator cuff tear: Left-She had a fall in April and has an incomplete of the left rotator cuff.  She was evaluated by Dr. [name] and had a MRI.  She is currently undergoing physical therapy and has been progressing well.  She does not want to proceed with surgical repair at this time.   Other fatigue: Chronic.   Follow Up Instructions: She will follow up in 5 months.     I discussed the assessment and treatment plan with the patient. The patient was provided an opportunity to ask questions and all were answered. The patient agreed with the plan and demonstrated an understanding of the instructions.   The patient was advised to call back or seek an in-person evaluation if the symptoms worsen or if the condition fails to improve as anticipated.  I provided 25 minutes of non-face-to-face time during this encounter.   [name], MD   Scribed by-  [name], PA-C

## 2018-10-26 DIAGNOSIS — M25512 Pain in left shoulder: Secondary | ICD-10-CM | POA: Diagnosis not present

## 2018-10-31 ENCOUNTER — Ambulatory Visit: Payer: Self-pay | Admitting: Rheumatology

## 2018-10-31 DIAGNOSIS — Z800 Family history of malignant neoplasm of digestive organs: Secondary | ICD-10-CM | POA: Diagnosis not present

## 2018-10-31 DIAGNOSIS — D509 Iron deficiency anemia, unspecified: Secondary | ICD-10-CM | POA: Diagnosis not present

## 2018-10-31 DIAGNOSIS — E611 Iron deficiency: Secondary | ICD-10-CM | POA: Diagnosis not present

## 2018-10-31 DIAGNOSIS — G43709 Chronic migraine without aura, not intractable, without status migrainosus: Secondary | ICD-10-CM | POA: Diagnosis not present

## 2018-10-31 DIAGNOSIS — K219 Gastro-esophageal reflux disease without esophagitis: Secondary | ICD-10-CM | POA: Diagnosis not present

## 2018-10-31 DIAGNOSIS — R05 Cough: Secondary | ICD-10-CM | POA: Diagnosis not present

## 2018-10-31 DIAGNOSIS — K5900 Constipation, unspecified: Secondary | ICD-10-CM | POA: Diagnosis not present

## 2018-11-02 ENCOUNTER — Other Ambulatory Visit: Payer: Self-pay

## 2018-11-02 ENCOUNTER — Encounter: Payer: Self-pay | Admitting: Rheumatology

## 2018-11-02 ENCOUNTER — Telehealth (INDEPENDENT_AMBULATORY_CARE_PROVIDER_SITE_OTHER): Payer: Medicare Other | Admitting: Rheumatology

## 2018-11-02 DIAGNOSIS — M19072 Primary osteoarthritis, left ankle and foot: Secondary | ICD-10-CM

## 2018-11-02 DIAGNOSIS — M51369 Other intervertebral disc degeneration, lumbar region without mention of lumbar back pain or lower extremity pain: Secondary | ICD-10-CM

## 2018-11-02 DIAGNOSIS — M8589 Other specified disorders of bone density and structure, multiple sites: Secondary | ICD-10-CM | POA: Diagnosis not present

## 2018-11-02 DIAGNOSIS — M797 Fibromyalgia: Secondary | ICD-10-CM

## 2018-11-02 DIAGNOSIS — Z5181 Encounter for therapeutic drug level monitoring: Secondary | ICD-10-CM | POA: Diagnosis not present

## 2018-11-02 DIAGNOSIS — M19042 Primary osteoarthritis, left hand: Secondary | ICD-10-CM | POA: Diagnosis not present

## 2018-11-02 DIAGNOSIS — M19041 Primary osteoarthritis, right hand: Secondary | ICD-10-CM

## 2018-11-02 DIAGNOSIS — M5030 Other cervical disc degeneration, unspecified cervical region: Secondary | ICD-10-CM

## 2018-11-02 DIAGNOSIS — R5383 Other fatigue: Secondary | ICD-10-CM | POA: Diagnosis not present

## 2018-11-02 DIAGNOSIS — M5136 Other intervertebral disc degeneration, lumbar region: Secondary | ICD-10-CM | POA: Diagnosis not present

## 2018-11-02 DIAGNOSIS — Z8639 Personal history of other endocrine, nutritional and metabolic disease: Secondary | ICD-10-CM | POA: Diagnosis not present

## 2018-11-02 DIAGNOSIS — Z8669 Personal history of other diseases of the nervous system and sense organs: Secondary | ICD-10-CM

## 2018-11-02 DIAGNOSIS — Z8739 Personal history of other diseases of the musculoskeletal system and connective tissue: Secondary | ICD-10-CM

## 2018-11-02 DIAGNOSIS — M19071 Primary osteoarthritis, right ankle and foot: Secondary | ICD-10-CM

## 2018-11-02 NOTE — Patient Instructions (Addendum)
Journal for Nurse Practitioners, 15(4), 263-267. Retrieved October 17, 2017 from [email or web address]">  Knee Exercises Ask your health care provider which exercises are safe for you. Do exercises exactly as told by your health care provider and adjust them as directed. It is normal to feel mild stretching, pulling, tightness, or discomfort as you do these exercises. Stop right away if you feel sudden pain or your pain gets worse. Do not begin these exercises until told by your health care provider. Stretching and range-of-motion exercises These exercises warm up your muscles and joints and improve the movement and flexibility of your knee. These exercises also help to relieve pain and swelling. Knee extension, prone 1. Lie on your abdomen (prone position) on a bed. 2. Place your left / right knee just beyond the edge of the surface so your knee is not on the bed. You can put a towel under your left / right thigh just above your kneecap for comfort. 3. Relax your leg muscles and allow gravity to straighten your knee (extension). You should feel a stretch behind your left / right knee. 4. Hold this position for __________ seconds. 5. Scoot up so your knee is supported between repetitions. Repeat __________ times. Complete this exercise __________ times a day. Knee flexion, active  1. Lie on your back with both legs straight. If this causes back discomfort, bend your left / right knee so your foot is flat on the floor. 2. Slowly slide your left / right heel back toward your buttocks. Stop when you feel a gentle stretch in the front of your knee or thigh (flexion). 3. Hold this position for __________ seconds. 4. Slowly slide your left / right heel back to the starting position. Repeat __________ times. Complete this exercise __________ times a day. Quadriceps stretch, prone  1. Lie on your abdomen on a firm surface, such as a bed or padded floor. 2. Bend your left / right knee and hold  your ankle. If you cannot reach your ankle or pant leg, loop a belt around your foot and grab the belt instead. 3. Gently pull your heel toward your buttocks. Your knee should not slide out to the side. You should feel a stretch in the front of your thigh and knee (quadriceps). 4. Hold this position for __________ seconds. Repeat __________ times. Complete this exercise __________ times a day. Hamstring, supine 1. Lie on your back (supine position). 2. Loop a belt or towel over the ball of your left / right foot. The ball of your foot is on the walking surface, right under your toes. 3. Straighten your left / right knee and slowly pull on the belt to raise your leg until you feel a gentle stretch behind your knee (hamstring). ? Do not let your knee bend while you do this. ? Keep your other leg flat on the floor. 4. Hold this position for __________ seconds. Repeat __________ times. Complete this exercise __________ times a day. Strengthening exercises These exercises build strength and endurance in your knee. Endurance is the ability to use your muscles for a long time, even after they get tired. Quadriceps, isometric This exercise stretches the muscles in front of your thigh (quadriceps) without moving your knee joint (isometric). 1. Lie on your back with your left / right leg extended and your other knee bent. Put a rolled towel or small pillow under your knee if told by your health care provider. 2. Slowly tense the muscles in the front of your left /   right thigh. You should see your kneecap slide up toward your hip or see increased dimpling just above the knee. This motion will push the back of the knee toward the floor. 3. For __________ seconds, hold the muscle as tight as you can without increasing your pain. 4. Relax the muscles slowly and completely. Repeat __________ times. Complete this exercise __________ times a day. Straight leg raises This exercise stretches the muscles in front  of your thigh (quadriceps) and the muscles that move your hips (hip flexors). 1. Lie on your back with your left / right leg extended and your other knee bent. 2. Tense the muscles in the front of your left / right thigh. You should see your kneecap slide up or see increased dimpling just above the knee. Your thigh may even shake a bit. 3. Keep these muscles tight as you raise your leg 4-6 inches (10-15 cm) off the floor. Do not let your knee bend. 4. Hold this position for __________ seconds. 5. Keep these muscles tense as you lower your leg. 6. Relax your muscles slowly and completely after each repetition. Repeat __________ times. Complete this exercise __________ times a day. Hamstring, isometric 1. Lie on your back on a firm surface. 2. Bend your left / right knee about __________ degrees. 3. Dig your left / right heel into the surface as if you are trying to pull it toward your buttocks. Tighten the muscles in the back of your thighs (hamstring) to "dig" as hard as you can without increasing any pain. 4. Hold this position for __________ seconds. 5. Release the tension gradually and allow your muscles to relax completely for __________ seconds after each repetition. Repeat __________ times. Complete this exercise __________ times a day. Hamstring curls If told by your health care provider, do this exercise while wearing ankle weights. Begin with __________ lb weights. Then increase the weight by 1 lb (0.5 kg) increments. Do not wear ankle weights that are more than __________ lb. 1. Lie on your abdomen with your legs straight. 2. Bend your left / right knee as far as you can without feeling pain. Keep your hips flat against the floor. 3. Hold this position for __________ seconds. 4. Slowly lower your leg to the starting position. Repeat __________ times. Complete this exercise __________ times a day. Squats This exercise strengthens the muscles in front of your thigh and knee  (quadriceps). 1. Stand in front of a table, with your feet and knees pointing straight ahead. You may rest your hands on the table for balance but not for support. 2. Slowly bend your knees and lower your hips like you are going to sit in a chair. ? Keep your weight over your heels, not over your toes. ? Keep your lower legs upright so they are parallel with the table legs. ? Do not let your hips go lower than your knees. ? Do not bend lower than told by your health care provider. ? If your knee pain increases, do not bend as low. 3. Hold the squat position for __________ seconds. 4. Slowly push with your legs to return to standing. Do not use your hands to pull yourself to standing. Repeat __________ times. Complete this exercise __________ times a day. Wall slides This exercise strengthens the muscles in front of your thigh and knee (quadriceps). 1. Lean your back against a smooth wall or door, and walk your feet out 18-24 inches (46-61 cm) from it. 2. Place your feet hip-width apart. 3.   Slowly slide down the wall or door until your knees bend __________ degrees. Keep your knees over your heels, not over your toes. Keep your knees in line with your hips. 4. Hold this position for __________ seconds. Repeat __________ times. Complete this exercise __________ times a day. Straight leg raises This exercise strengthens the muscles that rotate the leg at the hip and move it away from your body (hip abductors). 1. Lie on your side with your left / right leg in the top position. Lie so your head, shoulder, knee, and hip line up. You may bend your bottom knee to help you keep your balance. 2. Roll your hips slightly forward so your hips are stacked directly over each other and your left / right knee is facing forward. 3. Leading with your heel, lift your top leg 4-6 inches (10-15 cm). You should feel the muscles in your outer hip lifting. ? Do not let your foot drift forward. ? Do not let your knee  roll toward the ceiling. 4. Hold this position for __________ seconds. 5. Slowly return your leg to the starting position. 6. Let your muscles relax completely after each repetition. Repeat __________ times. Complete this exercise __________ times a day. Straight leg raises This exercise stretches the muscles that move your hips away from the front of the pelvis (hip extensors). 1. Lie on your abdomen on a firm surface. You can put a pillow under your hips if that is more comfortable. 2. Tense the muscles in your buttocks and lift your left / right leg about 4-6 inches (10-15 cm). Keep your knee straight as you lift your leg. 3. Hold this position for __________ seconds. 4. Slowly lower your leg to the starting position. 5. Let your leg relax completely after each repetition. Repeat __________ times. Complete this exercise __________ times a day. This information is not intended to replace advice given to you by your health care provider. Make sure you discuss any questions you have with your health care provider. Document Released: 11/11/2004 Document Revised: 10/18/2017 Document Reviewed: 10/18/2017 Elsevier Patient Education  2020 [place]. Neck Exercises Ask your health care provider which exercises are safe for you. Do exercises exactly as told by your health care provider and adjust them as directed. It is normal to feel mild stretching, pulling, tightness, or discomfort as you do these exercises. Stop right away if you feel sudden pain or your pain gets worse. Do not begin these exercises until told by your health care provider. Neck exercises can be important for many reasons. They can improve strength and maintain flexibility in your neck, which will help your upper back and prevent neck pain. Stretching exercises Rotation neck stretching  1. Sit in a chair or stand up. 2. Place your feet flat on the floor, shoulder width apart. 3. Slowly turn your head (rotate) to the right  until a slight stretch is felt. Turn it all the way to the right so you can look over your right shoulder. Do not tilt or tip your head. 4. Hold this position for 10-30 seconds. 5. Slowly turn your head (rotate) to the left until a slight stretch is felt. Turn it all the way to the left so you can look over your left shoulder. Do not tilt or tip your head. 6. Hold this position for 10-30 seconds. Repeat __________ times. Complete this exercise __________ times a day. Neck retraction 1. Sit in a sturdy chair or stand up. 2. Look straight ahead. Do  not bend your neck. 3. Use your fingers to push your chin backward (retraction). Do not bend your neck for this movement. Continue to face straight ahead. If you are doing the exercise properly, you will feel a slight sensation in your throat and a stretch at the back of your neck. 4. Hold the stretch for 1-2 seconds. Repeat __________ times. Complete this exercise __________ times a day. Strengthening exercises Neck press 1. Lie on your back on a firm bed or on the floor with a pillow under your head. 2. Use your neck muscles to push your head down on the pillow and straighten your spine. 3. Hold the position as well as you can. Keep your head facing up (in a neutral position) and your chin tucked. 4. Slowly count to 5 while holding this position. Repeat __________ times. Complete this exercise __________ times a day. Isometrics These are exercises in which you strengthen the muscles in your neck while keeping your neck still (isometrics). 1. Sit in a supportive chair and place your hand on your forehead. 2. Keep your head and face facing straight ahead. Do not flex or extend your neck while doing isometrics. 3. Push forward with your head and neck while pushing back with your hand. Hold for 10 seconds. 4. Do the sequence again, this time putting your hand against the back of your head. Use your head and neck to push backward against the hand  pressure. 5. Finally, do the same exercise on either side of your head, pushing sideways against the pressure of your hand. Repeat __________ times. Complete this exercise __________ times a day. Prone head lifts 1. Lie face-down (prone position), resting on your elbows so that your chest and upper back are raised. 2. Start with your head facing downward, near your chest. Position your chin either on or near your chest. 3. Slowly lift your head upward. Lift until you are looking straight ahead. Then continue lifting your head as far back as you can comfortably stretch. 4. Hold your head up for 5 seconds. Then slowly lower it to your starting position. Repeat __________ times. Complete this exercise __________ times a day. Supine head lifts 1. Lie on your back (supine position), bending your knees to point to the ceiling and keeping your feet flat on the floor. 2. Lift your head slowly off the floor, raising your chin toward your chest. 3. Hold for 5 seconds. Repeat __________ times. Complete this exercise __________ times a day. Scapular retraction 1. Stand with your arms at your sides. Look straight ahead. 2. Slowly pull both shoulders (scapulae) backward and downward (retraction) until you feel a stretch between your shoulder blades in your upper back. 3. Hold for 10-30 seconds. 4. Relax and repeat. Repeat __________ times. Complete this exercise __________ times a day. Contact a health care provider if:  Your neck pain or discomfort gets much worse when you do an exercise.  Your neck pain or discomfort does not improve within 2 hours after you exercise. If you have any of these problems, stop exercising right away. Do not do the exercises again unless your health care provider says that you can. Get help right away if:  You develop sudden, severe neck pain. If this happens, stop exercising right away. Do not do the exercises again unless your health care provider says that you can.  This information is not intended to replace advice given to you by your health care provider. Make sure you discuss any questions you have  with your health care provider. Document Released: 12/09/2014 Document Revised: 10/26/2017 Document Reviewed: 10/26/2017 Elsevier Patient Education  2020 [place]. Back Exercises These exercises help to make your trunk and back strong. They also help to keep the lower back flexible. Doing these exercises can help to prevent back pain or lessen existing pain.  If you have back pain, try to do these exercises 2-3 times each day or as told by your doctor.  As you get better, do the exercises once each day. Repeat the exercises more often as told by your doctor.  To stop back pain from coming back, do the exercises once each day, or as told by your doctor. Exercises Single knee to chest Do these steps 3-5 times in a row for each leg: 5. Lie on your back on a firm bed or the floor with your legs stretched out. 6. Bring one knee to your chest. 7. Grab your knee or thigh with both hands and hold them it in place. 8. Pull on your knee until you feel a gentle stretch in your lower back or buttocks. 9. Keep doing the stretch for 10-30 seconds. 10. Slowly let go of your leg and straighten it. Pelvic tilt Do these steps 5-10 times in a row: 5. Lie on your back on a firm bed or the floor with your legs stretched out. 6. Bend your knees so they point up to the ceiling. Your feet should be flat on the floor. 7. Tighten your lower belly (abdomen) muscles to press your lower back against the floor. This will make your tailbone point up to the ceiling instead of pointing down to your feet or the floor. 8. Stay in this position for 5-10 seconds while you gently tighten your muscles and breathe evenly. Cat-cow Do these steps until your lower back bends more easily: 1. Get on your hands and knees on a firm surface. Keep your hands under your shoulders, and keep your  knees under your hips. You may put padding under your knees. 2. Let your head hang down toward your chest. Tighten (contract) the muscles in your belly. Point your tailbone toward the floor so your lower back becomes rounded like the back of a cat. 3. Stay in this position for 5 seconds. 4. Slowly lift your head. Let the muscles of your belly relax. Point your tailbone up toward the ceiling so your back forms a sagging arch like the back of a cow. 5. Stay in this position for 5 seconds.  Press-ups Do these steps 5-10 times in a row: 5. Lie on your belly (face-down) on the floor. 6. Place your hands near your head, about shoulder-width apart. 7. While you keep your back relaxed and keep your hips on the floor, slowly straighten your arms to raise the top half of your body and lift your shoulders. Do not use your back muscles. You may change where you place your hands in order to make yourself more comfortable. 8. Stay in this position for 5 seconds. 9. Slowly return to lying flat on the floor.  Bridges Do these steps 10 times in a row: 7. Lie on your back on a firm surface. 8. Bend your knees so they point up to the ceiling. Your feet should be flat on the floor. Your arms should be flat at your sides, next to your body. 9. Tighten your butt muscles and lift your butt off the floor until your waist is almost as high as your knees. If  you do not feel the muscles working in your butt and the back of your thighs, slide your feet 1-2 inches farther away from your butt. 10. Stay in this position for 3-5 seconds. 11. Slowly lower your butt to the floor, and let your butt muscles relax. If this exercise is too easy, try doing it with your arms crossed over your chest. Belly crunches Do these steps 5-10 times in a row: 6. Lie on your back on a firm bed or the floor with your legs stretched out. 7. Bend your knees so they point up to the ceiling. Your feet should be flat on the floor. 8. Cross your  arms over your chest. 9. Tip your chin a little bit toward your chest but do not bend your neck. 10. Tighten your belly muscles and slowly raise your chest just enough to lift your shoulder blades a tiny bit off of the floor. Avoid raising your body higher than that, because it can put too much stress on your low back. 11. Slowly lower your chest and your head to the floor. Back lifts Do these steps 5-10 times in a row: 5. Lie on your belly (face-down) with your arms at your sides, and rest your forehead on the floor. 6. Tighten the muscles in your legs and your butt. 7. Slowly lift your chest off of the floor while you keep your hips on the floor. Keep the back of your head in line with the curve in your back. Look at the floor while you do this. 8. Stay in this position for 3-5 seconds. 9. Slowly lower your chest and your face to the floor. Contact a doctor if:  Your back pain gets a lot worse when you do an exercise.  Your back pain does not get better 2 hours after you exercise. If you have any of these problems, stop doing the exercises. Do not do them again unless your doctor says it is okay. Get help right away if:  You have sudden, very bad back pain. If this happens, stop doing the exercises. Do not do them again unless your doctor says it is okay. This information is not intended to replace advice given to you by your health care provider. Make sure you discuss any questions you have with your health care provider. Document Released: 01/30/2010 Document Revised: 09/22/2017 Document Reviewed: 09/22/2017 Elsevier Patient Education  2020 [place].  Hip Bursitis Rehab Ask your health care provider which exercises are safe for you. Do exercises exactly as told by your health care provider and adjust them as directed. It is normal to feel mild stretching, pulling, tightness, or discomfort as you do these exercises. Stop right away if you feel sudden pain or your pain gets worse. Do  not begin these exercises until told by your health care provider. Stretching exercise This exercise warms up your muscles and joints and improves the movement and flexibility of your hip. This exercise also helps to relieve pain and stiffness. Iliotibial band stretch An iliotibial band is a strong band of muscle tissue that runs from the outer side of your hip to the outer side of your thigh and knee. 6. Lie on your side with your left / right leg in the top position. 7. Bend your left / right knee and grab your ankle. Stretch out your bottom arm to help you balance. 8. Slowly bring your knee back so your thigh is behind your body. 9. Slowly lower your knee toward the  floor until you feel a gentle stretch on the outside of your left / right thigh. If you do not feel a stretch and your knee will not fall farther, place the heel of your other foot on top of your knee and pull your knee down toward the floor with your foot. 10. Hold this position for __________ seconds. 11. Slowly return to the starting position. Repeat __________ times. Complete this exercise __________ times a day. Strengthening exercises These exercises build strength and endurance in your hip and pelvis. Endurance is the ability to use your muscles for a long time, even after they get tired. Bridge This exercise strengthens the muscles that move your thigh backward (hip extensors). 1. Lie on your back on a firm surface with your knees bent and your feet flat on the floor. 2. Tighten your buttocks muscles and lift your buttocks off the floor until your trunk is level with your thighs. ? Do not arch your back. ? You should feel the muscles working in your buttocks and the back of your thighs. If you do not feel these muscles, slide your feet 1-2 inches (2.5-5 cm) farther away from your buttocks. ? If this exercise is too easy, try doing it with your arms crossed over your chest. 3. Hold this position for __________ seconds. 4.  Slowly lower your hips to the starting position. 5. Let your muscles relax completely after each repetition. Repeat __________ times. Complete this exercise __________ times a day. Squats This exercise strengthens the muscles in front of your thigh and knee (quadriceps). 1. Stand in front of a table, with your feet and knees pointing straight ahead. You may rest your hands on the table for balance but not for support. 2. Slowly bend your knees and lower your hips like you are going to sit in a chair. ? Keep your weight over your heels, not over your toes. ? Keep your lower legs upright so they are parallel with the table legs. ? Do not let your hips go lower than your knees. ? Do not bend lower than told by your health care provider. ? If your hip pain increases, do not bend as low. 3. Hold the squat position for __________ seconds. 4. Slowly push with your legs to return to standing. Do not use your hands to pull yourself to standing. Repeat __________ times. Complete this exercise __________ times a day. Hip hike 6. Stand sideways on a bottom step. Stand on your left / right leg with your other foot unsupported next to the step. You can hold on to the railing or wall for balance if needed. 7. Keep your knees straight and your torso square. Then lift your left / right hip up toward the ceiling. 8. Hold this position for __________ seconds. 9. Slowly let your left / right hip lower toward the floor, past the starting position. Your foot should get closer to the floor. Do not lean or bend your knees. Repeat __________ times. Complete this exercise __________ times a day. Single leg stand 1. Without shoes, stand near a railing or in a doorway. You may hold on to the railing or door frame as needed for balance. 2. Squeeze your left / right buttock muscles, then lift up your other foot. ? Do not let your left / right hip push out to the side. ? It is helpful to stand in front of a mirror for  this exercise so you can watch your hip. 3. Hold this position for  __________ seconds. Repeat __________ times. Complete this exercise __________ times a day. This information is not intended to replace advice given to you by your health care provider. Make sure you discuss any questions you have with your health care provider. Document Released: 02/05/2004 Document Revised: 04/24/2018 Document Reviewed: 04/24/2018 Elsevier Patient Education  2020 [employer].

## 2018-11-05 DIAGNOSIS — J4520 Mild intermittent asthma, uncomplicated: Secondary | ICD-10-CM | POA: Diagnosis not present

## 2018-11-05 DIAGNOSIS — D509 Iron deficiency anemia, unspecified: Secondary | ICD-10-CM | POA: Diagnosis not present

## 2018-11-05 DIAGNOSIS — M8580 Other specified disorders of bone density and structure, unspecified site: Secondary | ICD-10-CM | POA: Diagnosis not present

## 2018-11-05 DIAGNOSIS — M159 Polyosteoarthritis, unspecified: Secondary | ICD-10-CM | POA: Diagnosis not present

## 2018-11-05 DIAGNOSIS — I10 Essential (primary) hypertension: Secondary | ICD-10-CM | POA: Diagnosis not present

## 2018-11-05 DIAGNOSIS — M152 Bouchard's nodes (with arthropathy): Secondary | ICD-10-CM | POA: Diagnosis not present

## 2018-11-05 DIAGNOSIS — E039 Hypothyroidism, unspecified: Secondary | ICD-10-CM | POA: Diagnosis not present

## 2018-11-08 ENCOUNTER — Other Ambulatory Visit: Payer: Self-pay

## 2018-11-08 DIAGNOSIS — Z79890 Hormone replacement therapy (postmenopausal): Secondary | ICD-10-CM | POA: Diagnosis not present

## 2018-11-08 DIAGNOSIS — Z1231 Encounter for screening mammogram for malignant neoplasm of breast: Secondary | ICD-10-CM | POA: Diagnosis not present

## 2018-11-08 DIAGNOSIS — Z01419 Encounter for gynecological examination (general) (routine) without abnormal findings: Secondary | ICD-10-CM | POA: Diagnosis not present

## 2018-11-08 DIAGNOSIS — Z681 Body mass index (BMI) 19 or less, adult: Secondary | ICD-10-CM | POA: Diagnosis not present

## 2018-11-08 MED ORDER — ALPRAZOLAM 0.5 MG PO TABS: ORAL_TABLET | ORAL | 1 refills | Status: DC

## 2018-11-13 DIAGNOSIS — M25512 Pain in left shoulder: Secondary | ICD-10-CM | POA: Diagnosis not present

## 2018-11-16 ENCOUNTER — Ambulatory Visit (INDEPENDENT_AMBULATORY_CARE_PROVIDER_SITE_OTHER): Payer: Medicare Other | Admitting: Psychiatry

## 2018-11-16 ENCOUNTER — Encounter: Payer: Self-pay | Admitting: Psychiatry

## 2018-11-16 ENCOUNTER — Other Ambulatory Visit: Payer: Self-pay | Admitting: Psychiatry

## 2018-11-16 ENCOUNTER — Other Ambulatory Visit: Payer: Self-pay

## 2018-11-16 DIAGNOSIS — F900 Attention-deficit hyperactivity disorder, predominantly inattentive type: Secondary | ICD-10-CM | POA: Diagnosis not present

## 2018-11-16 DIAGNOSIS — Z79899 Other long term (current) drug therapy: Secondary | ICD-10-CM | POA: Diagnosis not present

## 2018-11-16 DIAGNOSIS — G4710 Hypersomnia, unspecified: Secondary | ICD-10-CM | POA: Diagnosis not present

## 2018-11-16 DIAGNOSIS — F331 Major depressive disorder, recurrent, moderate: Secondary | ICD-10-CM

## 2018-11-16 DIAGNOSIS — F4010 Social phobia, unspecified: Secondary | ICD-10-CM

## 2018-11-16 DIAGNOSIS — F411 Generalized anxiety disorder: Secondary | ICD-10-CM | POA: Diagnosis not present

## 2018-11-16 MED ORDER — ARIPIPRAZOLE 5 MG PO TABS: 5.0000 mg | ORAL_TABLET | Freq: Every day | ORAL | 1 refills | Status: DC

## 2018-11-16 NOTE — Progress Notes (Signed)
Lauren Bradley PT:[id number] Apr 24, 1950 68 y.o.  Virtual Visit via [place]  I connected with pt by WebEx and verified that I am speaking with the correct person using two identifiers.   I discussed the limitations, risks, security and privacy concerns of performing an evaluation and management service by Lauren Bradley and the availability of in person appointments. I also discussed with the patient that there may be a patient responsible charge related to this service. The patient expressed understanding and agreed to proceed.  I discussed the assessment and treatment plan with the patient. The patient was provided an opportunity to ask questions and all were answered. The patient agreed with the plan and demonstrated an understanding of the instructions.   The patient was advised to call back or seek an in-person evaluation if the symptoms worsen or if the condition fails to improve as anticipated.  I provided 30 minutes of video time during this encounter. The call started at 330 and ended at 400. The patient was located at home and the provider was located office.   Subjective:   Patient ID:  Lauren Bradley is a 68 y.o. (DOB 1950/01/25) female.  Chief Complaint:  Chief Complaint  Patient presents with  . Follow-up    Medication Management  . Depression    Medication Management    Depression        Associated symptoms include myalgias and headaches.  Associated symptoms include no decreased concentration and no suicidal ideas.  Lauren Bradley presents  today for follow-up of depression, anxiety and insomnia and chronic pain.   At visit Jun 08, 2018.  No major meds were changed except she was trying to gradually increase the Abilify to 2 mg daily for optimal mood effect.  I feel much better on the Abilify.  Makes my brain feel more normal and focus is better and calmer overall.   Last visit August 31, 2018.  She had asked to increase Abilify from 2 to 3 mg daily.  She had seen Abilify benefit  at 2 mg but wished for additional benefit for mood and anxiety.  Anxious over the election and fearful about the future.  Does shooting with son and Lauren Bradley enjoys it too.    Has done well on 3 mg with additional improvement in mood, but still has a lot of anxiety.  Dr. Laurann Bradley noticed the difference in her mood.  Managed the weight and lost to baseline.  Wonders about the increase to 5 mg to help anxiety.  Benefit for energy which is reasonable with less napping than before Abilify.   Took half Provigil last week when driving distance and did ok with it.    Helped ADHD.  Function is better.  Still some days flounder.  Not as good as paperwork years ago but better with Abilify.  Helped having Lauren Bradley at home.  Was poor function before Abilify despite trying hard.  Abilify helped the depression clearly and very beneficial.  Not always sad now.  HA resolved.  Initially it caused great energy like a motor running and reduced need for sleep to 5-6 hours.  That has resolved.  SE weight gain without increasing calories.  Changed her sleep schedule for the better.  To bed earlier and up earlier.    I didn't realize how depressed I was.  Better organization and function and productivity.  Abilify helped regulate and correct her sleep wake schedule.  Not oversleeping now.  Anxiety remains high chronically.  Can talk too  much when anxious and her accountant commented on it to her.  QT has been good for her.  Chronic social anxiety.  Can be crippled from anxiety.    Stressed still dealing with hypochondriac mother with constant somatization and complaining and anxiety and frequently wanting to go to the ER.  Disc management with this. Mother compulsively and annoyingly call her repeatedly.  Feels shame over the feelings she has about her mother.  Major boundary issues from her mother.  Mother was neglectful in her childhood.  Recognizes some blacked out memories from her childhood.  Asked how to deal with this with  mother.  Some social anxiety still significant.  Past Psychiatric Medication Trials: Abilify 3 mg helpful, duloxetine 90, venlafaxine irritable, buspirone side effects, try cyclic antidepressants, Wellbutrin, Zoloft, Paxil, modafinil, Vyvanse 30, Provigil, Ritalin, Adderall AG,  propranolol Belsomra, Lunesta, Ambien, trazodone, alprazolam, doxepin, mirtazapine,  Review of Systems:  Review of Systems  Musculoskeletal: Positive for arthralgias, back pain and myalgias.  Neurological: Positive for headaches. Negative for dizziness, tremors and weakness.  Psychiatric/Behavioral: Positive for depression. Negative for agitation, behavioral problems, confusion, decreased concentration, dysphoric mood, hallucinations, self-injury, sleep disturbance and suicidal ideas. The patient is nervous/anxious. The patient is not hyperactive.     Medications: I have reviewed the patient's current medications.  Current Outpatient Medications  Medication Sig Dispense Refill  . ALPRAZolam (XANAX) 0.5 MG tablet TAKE 2 TABLETS BY MOUTH EVERY NIGHT AT BEDTIME AND 1 TABLET EVERY DAY AS NEEDED 75 tablet 1  . calcium carbonate (CALCIUM 600) 600 MG TABS tablet Take 600 mg by mouth.    . diphenhydrAMINE (BENADRYL) 50 MG capsule Take 50 mg by mouth every 6 (six) hours as needed.    . DULoxetine (CYMBALTA) 30 MG capsule TAKE 1 CAPSULE(30 MG) BY MOUTH DAILY 90 capsule 0  . DULoxetine (CYMBALTA) 60 MG capsule TAKE ONE CAPSULE BY MOUTH DAILY WITH ONE 30 MG CAPSULE OF DULOXETINE 90 capsule 0  . estradiol (VIVELLE-DOT) 0.025 MG/24HR Place 1 patch onto the skin 2 (two) times a week.     . fluticasone (FLONASE) 50 MCG/ACT nasal spray     . fluticasone (FLOVENT HFA) 110 MCG/ACT inhaler Inhale into the lungs as needed.     . Levomefolate Glucosamine (METHYLFOLATE PO) Take by mouth.    Marland Kitchen MAGNESIUM MALATE PO Take by mouth daily.    . modafinil (PROVIGIL) 100 MG tablet Take 1 tablet (100 mg total) by mouth daily. 30 tablet 0  . NP  THYROID 15 MG tablet     . progesterone (PROMETRIUM) 200 MG capsule Take by mouth.    . SUMAtriptan (IMITREX) 100 MG tablet TAKE 1 TABLET BY MOUTH AT ONSET OF HEADACHE. MAY REPEAT IN 2 HOURS. MAX 2 TABLETS PER DAY    . traMADol (ULTRAM) 50 MG tablet TAKE 1 TABLET(50 MG) BY MOUTH THREE TIMES DAILY AS NEEDED 90 tablet 0  . Vitamin D, Ergocalciferol, (DRISDOL) 50000 units CAPS capsule Take 50,000 Units by mouth every 7 (seven) days.     . ARIPiprazole (ABILIFY) 5 MG tablet Take 1 tablet (5 mg total) by mouth daily. 30 tablet 1   No current facility-administered medications for this visit.     Medication Side Effects: HA stopped with Abilify, weight gain, hungry is odd  Allergies:  Allergies  Allergen Reactions  . Hydrocodone Itching  . Codeine Nausea And Vomiting  . Other   . Sulfa Antibiotics   . Cephalosporins Rash  . Sulfamethoxazole Rash    Unknown  reaction    Past Medical History:  Diagnosis Date  . Asthma   . Fatigue   . Infertility, female     Family History  Problem Relation Age of Onset  . Fibromyalgia Mother   . Kidney failure Mother   . Mental illness Mother   . Heart disease Father   . COPD Father   . Cancer Father        colon, liver   . Hypothyroidism Son     Social History   Socioeconomic History  . Marital status: Single    Spouse name: Not on file  . Number of children: Not on file  . Years of education: Not on file  . Highest education level: Not on file  Occupational History  . Not on file  Social Needs  . Financial resource strain: Not on file  . Food insecurity    Worry: Not on file    Inability: Not on file  . Transportation needs    Medical: Not on file    Non-medical: Not on file  Tobacco Use  . Smoking status: Never Smoker  . Smokeless tobacco: Never Used  Substance and Sexual Activity  . Alcohol use: Yes    Comment: "Very little"  . Drug use: No  . Sexual activity: Not on file  Lifestyle  . Physical activity    Days per  week: Not on file    Minutes per session: Not on file  . Stress: Not on file  Relationships  . Social Herbalist on phone: Not on file    Gets together: Not on file    Attends religious service: Not on file    Active member of club or organization: Not on file    Attends meetings of clubs or organizations: Not on file    Relationship status: Not on file  . Intimate partner violence    Fear of current or ex partner: Not on file    Emotionally abused: Not on file    Physically abused: Not on file    Forced sexual activity: Not on file  Other Topics Concern  . Not on file  Social History Narrative  . Not on file    Past Medical History, Surgical history, Social history, and Family history were reviewed and updated as appropriate.   Please see review of systems for further details on the patient's review from today.   Objective:   Physical Exam:  There were no vitals taken for this visit.  Physical Exam Neurological:     Mental Status: She is alert and oriented to person, place, and time.     Cranial Nerves: No dysarthria.  Psychiatric:        Attention and Perception: Attention normal.        Mood and Affect: Mood is anxious. Mood is not depressed.        Speech: Speech normal.        Behavior: Behavior is cooperative.        Thought Content: Thought content normal. Thought content is not paranoid or delusional. Thought content does not include homicidal or suicidal ideation. Thought content does not include homicidal or suicidal plan.        Cognition and Memory: Cognition and memory normal.        Judgment: Judgment normal.     Comments: Insight fair to good.  Depression has almost resolved with the Abilify for the most part.  Anxiety remains high.  Lab Review:     Component Value Date/Time   NA 137 02/21/2017 1635   K 4.3 02/21/2017 1635   CL 102 02/21/2017 1635   CO2 29 02/21/2017 1635   GLUCOSE 88 02/21/2017 1635   BUN 16 02/21/2017 1635    CREATININE 0.58 02/21/2017 1635   CALCIUM 9.4 02/21/2017 1635   PROT 6.8 02/21/2017 1635   ALBUMIN 4.4 12/30/2015 1507   AST 22 02/21/2017 1635   ALT 20 02/21/2017 1635   ALKPHOS 28 (L) 12/30/2015 1507   BILITOT 0.3 02/21/2017 1635   GFRNONAA 97 02/21/2017 1635   GFRAA 112 02/21/2017 1635       Component Value Date/Time   WBC 6.6 02/21/2017 1635   RBC 4.36 02/21/2017 1635   HGB 12.9 02/21/2017 1635   HCT 38.2 02/21/2017 1635   PLT 327 02/21/2017 1635   MCV 87.6 02/21/2017 1635   MCH 29.6 02/21/2017 1635   MCHC 33.8 02/21/2017 1635   RDW 12.4 02/21/2017 1635   LYMPHSABS 1,228 02/21/2017 1635   MONOABS 402 12/30/2015 1507   EOSABS 132 02/21/2017 1635   BASOSABS 73 02/21/2017 1635    No results found for: POCLITH, LITHIUM   No results found for: PHENYTOIN, PHENOBARB, VALPROATE, CBMZ   .res Assessment: Plan:    Major depressive disorder, recurrent episode, moderate (HCC)  Attention deficit hyperactivity disorder (ADHD), predominantly inattentive type  Generalized anxiety disorder  Hypersomnolence disorder, persistent  Social anxiety disorder   Less depressed with the Abilify clear benefit for depression and anxiety.  She is working the dosage up more slowly and is tolerating it better. It's helped productivity and mood and energy and less hypersomnolence.  Option for anxiety is increase the Abilify.  She'd like to do try to increase to 5 mg .   Disc glucose and A1c now.Marland Kitchen   Option check labs and Hgb A1C bc in past was prediabetic.  Disc the option of metformin.  We will defer until Covid dies down and then we will check hemoglobin A1c .  Disc the pros/cons.   She'd like to hold off on metformin.  Discussed long half-life of Abilify and potential side effects that could be delayed.  We discussed the short-term risks associated with benzodiazepines including sedation and increased fall risk among others.  Discussed long-term side effect risk including dependence,  potential withdrawal symptoms, and the potential eventual dose-related risk of dementia. Try reducing Xanax for sleep if possible.  However insomnia is also a risk factor for dementia.  Disc rebound insomnia.  Consider reducing to protect the memory.  Discussed potential metabolic side effects associated with atypical antipsychotics, as well as potential risk for movement side effects. Advised pt to contact office if movement side effects occur.   Later could consider reducing duloxetine for cost reasons. Continue duloxetine 90.Marland Kitchen  Answered questions about types of anti-acid meds and risk dementia.  Continue modafinil 100 prn hypersomnolence. Protect sleep and try to get more if possible.  Stressed still dealing with hypochondriac mother with constant somatization and complaining and anxiety and frequently wanting to go to the ER.  Disc management with this. Stress of dealing with the lying and manipulation of her mother.  Disc this in therapy. And Family problems.  This is a chronic problem.  Remains very stressed with mother.  "Driving me out of my mind!"  Disc this in detail and how to handle her manipulation and hypochondriasis.  Having to help her a lot.  Disc limit setting and boundaries.  Disc regular scheduling PCP visits.    45 min appt  FU 8 weeks  Lynder Parents, MD, DFAPA   Please see After Visit Summary for patient specific instructions.  Future Appointments  Date Time Provider [place]  04/03/2019  3:45 PM [name], MD [facility] None    No orders of the defined types were placed in this encounter.     -------------------------------

## 2018-11-30 DIAGNOSIS — Z20828 Contact with and (suspected) exposure to other viral communicable diseases: Secondary | ICD-10-CM | POA: Diagnosis not present

## 2018-12-17 ENCOUNTER — Other Ambulatory Visit: Payer: Self-pay | Admitting: Psychiatry

## 2018-12-18 DIAGNOSIS — L600 Ingrowing nail: Secondary | ICD-10-CM | POA: Diagnosis not present

## 2018-12-18 DIAGNOSIS — M7741 Metatarsalgia, right foot: Secondary | ICD-10-CM | POA: Diagnosis not present

## 2018-12-18 DIAGNOSIS — M25372 Other instability, left ankle: Secondary | ICD-10-CM | POA: Diagnosis not present

## 2018-12-18 DIAGNOSIS — M7752 Other enthesopathy of left foot: Secondary | ICD-10-CM | POA: Diagnosis not present

## 2018-12-18 DIAGNOSIS — M7742 Metatarsalgia, left foot: Secondary | ICD-10-CM | POA: Diagnosis not present

## 2018-12-19 ENCOUNTER — Telehealth: Payer: Self-pay | Admitting: Psychiatry

## 2018-12-19 ENCOUNTER — Other Ambulatory Visit: Payer: Self-pay

## 2018-12-19 MED ORDER — MODAFINIL 100 MG PO TABS: 100.0000 mg | ORAL_TABLET | Freq: Every day | ORAL | 0 refills | Status: DC

## 2018-12-19 NOTE — Telephone Encounter (Signed)
Refill called into her pharmacy Last refill #30 on 02/17/2018

## 2018-12-19 NOTE — Telephone Encounter (Signed)
Pt would like a refill on Modafinil 100mg . Please send to [facility] on [address].

## 2018-12-20 ENCOUNTER — Telehealth: Payer: Self-pay

## 2018-12-20 DIAGNOSIS — H04123 Dry eye syndrome of bilateral lacrimal glands: Secondary | ICD-10-CM | POA: Diagnosis not present

## 2018-12-20 DIAGNOSIS — H5213 Myopia, bilateral: Secondary | ICD-10-CM | POA: Diagnosis not present

## 2018-12-20 NOTE — Telephone Encounter (Signed)
Prior authorization submitted and approved for Modafinil 100 mg 1/daily effective 01/11/2018-01/11/2020 through [facility] notified of approval

## 2019-01-10 ENCOUNTER — Other Ambulatory Visit: Payer: Self-pay | Admitting: Psychiatry

## 2019-01-10 NOTE — Telephone Encounter (Signed)
Patient should have picked up 90 day on 12/07, not sure why the request again. Can you check with pharmacy when you get time.

## 2019-01-11 NOTE — Telephone Encounter (Signed)
She did pick up a 90 day on 12/07. Pharmacy will leave a not to request closer to time she needs it.

## 2019-02-01 ENCOUNTER — Ambulatory Visit: Payer: Medicare Other

## 2019-02-09 ENCOUNTER — Other Ambulatory Visit: Payer: Self-pay

## 2019-02-09 DIAGNOSIS — D509 Iron deficiency anemia, unspecified: Secondary | ICD-10-CM | POA: Diagnosis not present

## 2019-02-09 DIAGNOSIS — J4520 Mild intermittent asthma, uncomplicated: Secondary | ICD-10-CM | POA: Diagnosis not present

## 2019-02-09 DIAGNOSIS — M8580 Other specified disorders of bone density and structure, unspecified site: Secondary | ICD-10-CM | POA: Diagnosis not present

## 2019-02-09 DIAGNOSIS — E039 Hypothyroidism, unspecified: Secondary | ICD-10-CM | POA: Diagnosis not present

## 2019-02-09 DIAGNOSIS — M159 Polyosteoarthritis, unspecified: Secondary | ICD-10-CM | POA: Diagnosis not present

## 2019-02-09 DIAGNOSIS — M152 Bouchard's nodes (with arthropathy): Secondary | ICD-10-CM | POA: Diagnosis not present

## 2019-02-09 DIAGNOSIS — I10 Essential (primary) hypertension: Secondary | ICD-10-CM | POA: Diagnosis not present

## 2019-02-09 MED ORDER — MODAFINIL 100 MG PO TABS: 100.0000 mg | ORAL_TABLET | Freq: Every day | ORAL | 0 refills | Status: DC

## 2019-02-13 DIAGNOSIS — G43709 Chronic migraine without aura, not intractable, without status migrainosus: Secondary | ICD-10-CM | POA: Diagnosis not present

## 2019-03-02 DIAGNOSIS — Z808 Family history of malignant neoplasm of other organs or systems: Secondary | ICD-10-CM | POA: Diagnosis not present

## 2019-03-02 DIAGNOSIS — L309 Dermatitis, unspecified: Secondary | ICD-10-CM | POA: Diagnosis not present

## 2019-03-02 DIAGNOSIS — D485 Neoplasm of uncertain behavior of skin: Secondary | ICD-10-CM | POA: Diagnosis not present

## 2019-03-02 DIAGNOSIS — L851 Acquired keratosis [keratoderma] palmaris et plantaris: Secondary | ICD-10-CM | POA: Diagnosis not present

## 2019-03-02 DIAGNOSIS — L821 Other seborrheic keratosis: Secondary | ICD-10-CM | POA: Diagnosis not present

## 2019-03-02 DIAGNOSIS — Z411 Encounter for cosmetic surgery: Secondary | ICD-10-CM | POA: Diagnosis not present

## 2019-03-02 DIAGNOSIS — D225 Melanocytic nevi of trunk: Secondary | ICD-10-CM | POA: Diagnosis not present

## 2019-03-02 DIAGNOSIS — L03032 Cellulitis of left toe: Secondary | ICD-10-CM | POA: Diagnosis not present

## 2019-03-02 DIAGNOSIS — L720 Epidermal cyst: Secondary | ICD-10-CM | POA: Diagnosis not present

## 2019-03-02 DIAGNOSIS — L570 Actinic keratosis: Secondary | ICD-10-CM | POA: Diagnosis not present

## 2019-03-02 DIAGNOSIS — L578 Other skin changes due to chronic exposure to nonionizing radiation: Secondary | ICD-10-CM | POA: Diagnosis not present

## 2019-03-02 DIAGNOSIS — Z85828 Personal history of other malignant neoplasm of skin: Secondary | ICD-10-CM | POA: Diagnosis not present

## 2019-03-03 ENCOUNTER — Other Ambulatory Visit: Payer: Self-pay | Admitting: Psychiatry

## 2019-03-03 DIAGNOSIS — F331 Major depressive disorder, recurrent, moderate: Secondary | ICD-10-CM

## 2019-03-05 ENCOUNTER — Other Ambulatory Visit: Payer: Self-pay

## 2019-03-05 MED ORDER — DULOXETINE HCL 60 MG PO CPEP: ORAL_CAPSULE | ORAL | 0 refills | Status: DC

## 2019-03-05 MED ORDER — MODAFINIL 100 MG PO TABS: 100.0000 mg | ORAL_TABLET | Freq: Every day | ORAL | 0 refills | Status: DC

## 2019-03-05 MED ORDER — DULOXETINE HCL 30 MG PO CPEP: ORAL_CAPSULE | ORAL | 0 refills | Status: DC

## 2019-03-09 DIAGNOSIS — Z23 Encounter for immunization: Secondary | ICD-10-CM | POA: Diagnosis not present

## 2019-03-20 DIAGNOSIS — R7309 Other abnormal glucose: Secondary | ICD-10-CM | POA: Diagnosis not present

## 2019-03-20 DIAGNOSIS — Z800 Family history of malignant neoplasm of digestive organs: Secondary | ICD-10-CM | POA: Diagnosis not present

## 2019-03-20 DIAGNOSIS — K219 Gastro-esophageal reflux disease without esophagitis: Secondary | ICD-10-CM | POA: Diagnosis not present

## 2019-03-20 DIAGNOSIS — K5900 Constipation, unspecified: Secondary | ICD-10-CM | POA: Diagnosis not present

## 2019-03-20 DIAGNOSIS — E611 Iron deficiency: Secondary | ICD-10-CM | POA: Diagnosis not present

## 2019-03-22 DIAGNOSIS — J3089 Other allergic rhinitis: Secondary | ICD-10-CM | POA: Diagnosis not present

## 2019-03-22 DIAGNOSIS — J301 Allergic rhinitis due to pollen: Secondary | ICD-10-CM | POA: Diagnosis not present

## 2019-03-22 DIAGNOSIS — J3081 Allergic rhinitis due to animal (cat) (dog) hair and dander: Secondary | ICD-10-CM | POA: Diagnosis not present

## 2019-03-22 DIAGNOSIS — J45991 Cough variant asthma: Secondary | ICD-10-CM | POA: Diagnosis not present

## 2019-03-26 NOTE — Progress Notes (Deleted)
Virtual Visit via Telephone Note  I connected with Lauren Bradley on 03/26/19 at  3:45 PM EDT by telephone and verified that I am speaking with the correct person using two identifiers.  Location: Patient: Home  Provider: Clinic  This service was conducted via virtual visit. The patient was located at home. I was located in my office.  Consent was obtained prior to the virtual visit and is aware of possible charges through their insurance for this visit.  The patient is an established patient.  Dr. Estanislado Pandy, MD conducted the virtual visit and [name], PA-C acted as scribe during the service.  Office staff helped with scheduling follow up visits after the service was conducted.      I discussed the limitations, risks, security and privacy concerns of performing an evaluation and management service by telephone and the availability of in person appointments. I also discussed with the patient that there may be a patient responsible charge related to this service. The patient expressed understanding and agreed to proceed.  CC: History of Present Illness: Patient is a 69 year old female with a past medical history of   Review of Systems  Constitutional: Positive for malaise/fatigue.  HENT: Negative for congestion.   Eyes: Negative for pain.  Respiratory: Negative for shortness of breath.   Cardiovascular: Negative for leg swelling.  Gastrointestinal: Positive for constipation.  Genitourinary: Negative for frequency.  Musculoskeletal: Positive for back pain, joint pain and neck pain.  Skin: Negative for rash.  Neurological: Negative for weakness.  Endo/Heme/Allergies: Bruises/bleeds easily.  Psychiatric/Behavioral: Negative for memory loss.      Observations/Objective: Physical Exam  Constitutional: She is oriented to person, place, and time.  Neurological: She is alert and oriented to person, place, and time.  Psychiatric: Mood, memory, affect and judgment normal.   Patient  reports morning stiffness for 24 hours.   Patient reports nocturnal pain.  Difficulty dressing/grooming: Denies Difficulty climbing stairs: Denies Difficulty getting out of chair: Denies Difficulty using hands for taps, buttons, cutlery, and/or writing: Reports    Assessment and Plan: Fibromyalgia-She has ongoing trochanteric bursitis bilaterally. She was encouraged to perform stretching exercises.  We will mail a handout of exercises to perform.  She is taking Cymbalta90 mg by mouth daily and tramadol 50 mg 1 tablet by mouth 3 times daily as needed for pain relief.   Medication monitoring encounter - Tramadol 50 mg by mouth TID.  Primary osteoarthritis of both hands:Followed by Dr. [name]  Primary osteoarthritis of both feet:She sees a podiatrist in [place].  She wears braces, orthotics, and supportive shoes.  She experiences some instability    DDD (degenerative disc disease), cervical:  DDD (degenerative disc disease), lumbar:    Osteopenia of multiple sites: According to the patient she had a DEXA last year.  She is taking vitamin D 50,000 units by mouth once weekly.   History of rotator cuff tear: Left-She had a fall in April and has an incomplete of the left rotator cuff.  She was evaluated by Dr. [name] and had a MRI.  She is currently undergoing physical therapy and has been progressing well.  She does not want to proceed with surgical repair at this time.   Other fatigue: Chronic.   Follow Up Instructions: She will follow up in    I discussed the assessment and treatment plan with the patient. The patient was provided an opportunity to ask questions and all were answered. The patient agreed with the plan and demonstrated an  understanding of the instructions.   The patient was advised to call back or seek an in-person evaluation if the symptoms worsen or if the condition fails to improve as anticipated.  I provided *** minutes of non-face-to-face time during  this encounter.   Ofilia Neas, PA-C

## 2019-04-02 ENCOUNTER — Other Ambulatory Visit: Payer: Self-pay | Admitting: Psychiatry

## 2019-04-02 DIAGNOSIS — J3089 Other allergic rhinitis: Secondary | ICD-10-CM | POA: Diagnosis not present

## 2019-04-02 DIAGNOSIS — J3081 Allergic rhinitis due to animal (cat) (dog) hair and dander: Secondary | ICD-10-CM | POA: Diagnosis not present

## 2019-04-02 DIAGNOSIS — J301 Allergic rhinitis due to pollen: Secondary | ICD-10-CM | POA: Diagnosis not present

## 2019-04-03 ENCOUNTER — Encounter: Payer: Medicare Other | Admitting: Rheumatology

## 2019-04-03 ENCOUNTER — Other Ambulatory Visit: Payer: Self-pay

## 2019-04-03 ENCOUNTER — Encounter: Payer: Self-pay | Admitting: Rheumatology

## 2019-04-03 DIAGNOSIS — Z1152 Encounter for screening for COVID-19: Secondary | ICD-10-CM | POA: Diagnosis not present

## 2019-04-03 NOTE — Telephone Encounter (Signed)
Overdue for f/up, last visit 11/05

## 2019-04-04 NOTE — Progress Notes (Signed)
This encounter was created in error - please disregard.

## 2019-04-04 NOTE — Progress Notes (Signed)
Virtual Visit via Telephone Note  I connected with Lauren Bradley on 04/05/19 at  4:00 PM EDT by telephone and verified that I am speaking with the correct person using two identifiers.  Location: Patient: Home  Provider: Clinic  This service was conducted via virtual visit.  The patient was located at home. I was located in my office.  Consent was obtained prior to the virtual visit and is aware of possible charges through their insurance for this visit.  The patient is an established patient.  Dr. Estanislado Pandy, MD conducted the virtual visit and [name], PA-C acted as scribe during the service.  Office staff helped with scheduling follow up visits after the service was conducted.   I discussed the limitations, risks, security and privacy concerns of performing an evaluation and management service by telephone and the availability of in person appointments. I also discussed with the patient that there may be a patient responsible charge related to this service. The patient expressed understanding and agreed to proceed.  CC: Left shoulder joint pain  History of Present Illness: Patient is a 69 year old female with a history of primary osteoarthritis, DDD, and fibromyalgia.  She is taking cymbalta 90 mg by mouth daily.   She continues have generalized muscle aches muscle tenderness due to fibromyalgia.  Overall her discomfort has been tolerable.  She has no longer taking tramadol due to not updating her UDS.  She states she had no difficulty coming off of tramadol and does not need a refill at this time.  She states that she does have a flare she will notify us when she needs a refill/to update UDS.  She continues to do home exercises for her left torn rotator cuff.  She still has discomfort and stiffness but exercises have been helping.  She is also having increased lower back pain and has started home exercises for that as well.  She has also been using a stationary bike for exercise.  She has  chronic pain in bilateral CMC joints.  She has not been using a CMC joint braces recently and her discomfort is actually improved.  She is not ready to proceed with surgery at this time.  Review of Systems  Constitutional: Positive for malaise/fatigue. Negative for fever.  HENT: Negative for congestion.   Eyes: Negative for photophobia, pain, discharge and redness.  Respiratory: Negative for cough, shortness of breath and wheezing.   Cardiovascular: Negative for chest pain, palpitations and leg swelling.  Gastrointestinal: Positive for constipation. Negative for blood in stool and diarrhea.  Genitourinary: Negative for dysuria and frequency.  Musculoskeletal: Positive for back pain and joint pain. Negative for myalgias and neck pain.  Skin: Negative for rash.  Neurological: Negative for dizziness and headaches.  Psychiatric/Behavioral: Negative for depression and memory loss. The patient is not nervous/anxious and does not have insomnia.       Observations/Objective: Physical Exam  Constitutional: She is oriented to person, place, and time.  Neurological: She is alert and oriented to person, place, and time.  Psychiatric: Mood, memory, affect and judgment normal.   Patient reports joint stiffness all day   Patient reports nocturnal pain.  Difficulty dressing/grooming: Denies Difficulty climbing stairs: Denies Difficulty getting out of chair: Denies Difficulty using hands for taps, buttons, cutlery, and/or writing: Reports   Assessment and Plan:  Fibromyalgia-She experiences intermittent myalgias and muscle tenderness due to fibromyalgia.  She continues to take cymbalta 90 mg daily. She is no longer taking tramadol due to  not updating her UDS.  She had no difficulty discontinuing tramadol and is not planning on restarting it at this time.  Her discomfort has been tolerable.  She continues to follow an anti-inflammatory diet and continues to exercise on a regular basis.  She performs  home exercises for 1 hour daily, which helps with her overall arthralgias and myalgias. She has also started riding a stationary bike routinely. We discussed the importance of regular exercise and good sleep hygiene.  She will follow up in 6 months.     Medication monitoring encounter - She was previously prescribed Tramadol 50 mg by mouth TID, but she has not taken tramadol recently.  She is clinically doing well off of tramadol and does not want a refill at this time.   Primary osteoarthritis of both hands:Followed by Dr. [name].She has chronic CMC joint pain bilaterally. She has stopped using the [facility] joint brace, which has alleviated her discomfort. She continues to have decreased grip strength and difficulty opening jars. She is not ready to proceed with surgery at this time.   Primary osteoarthritis of both feet:She sees a podiatrist in [place].She wears braces, orthotics, and supportive shoes.   DDD (degenerative disc disease), cervical:She is not having increased neck pain or stiffness at this time.  No symptoms of radiculopathy.   DDD (degenerative disc disease), lumbar:She continues to have chronic lower back pain in the lumbar region and both SI joints.  She has been performing home exercises.    Osteopenia of multiple sites:According to the patient she had a DEXA last year. She is taking vitamin D 50,000 units by mouth once weekly and a calcium supplement 600 mg daily.   History of rotator cuff tear: Left-Followed by Dr. [name].  She fell in April 2020 and had a incomplete tear evident on MRI.  She did not want to proceed with surgery at the time of the injury. She underwent physical therapy and is now performing home exercises for 1 hour per day.  Other fatigue: Chronic.  Follow Up Instructions: She will follow up in 6 months   I discussed the assessment and treatment plan with the patient. The patient was provided an opportunity to ask questions and all were  answered. The patient agreed with the plan and demonstrated an understanding of the instructions.   The patient was advised to call back or seek an in-person evaluation if the symptoms worsen or if the condition fails to improve as anticipated.  I provided 30 minutes of non-face-to-face time during this encounter.   [name], MD   Scribed by-  [name], PA-C

## 2019-04-05 ENCOUNTER — Encounter: Payer: Self-pay | Admitting: Rheumatology

## 2019-04-05 ENCOUNTER — Telehealth (INDEPENDENT_AMBULATORY_CARE_PROVIDER_SITE_OTHER): Payer: Medicare Other | Admitting: Rheumatology

## 2019-04-05 ENCOUNTER — Other Ambulatory Visit: Payer: Self-pay

## 2019-04-05 DIAGNOSIS — M19041 Primary osteoarthritis, right hand: Secondary | ICD-10-CM | POA: Diagnosis not present

## 2019-04-05 DIAGNOSIS — M797 Fibromyalgia: Secondary | ICD-10-CM

## 2019-04-05 DIAGNOSIS — Z8739 Personal history of other diseases of the musculoskeletal system and connective tissue: Secondary | ICD-10-CM | POA: Diagnosis not present

## 2019-04-05 DIAGNOSIS — M5030 Other cervical disc degeneration, unspecified cervical region: Secondary | ICD-10-CM | POA: Diagnosis not present

## 2019-04-05 DIAGNOSIS — M8589 Other specified disorders of bone density and structure, multiple sites: Secondary | ICD-10-CM

## 2019-04-05 DIAGNOSIS — M5136 Other intervertebral disc degeneration, lumbar region: Secondary | ICD-10-CM

## 2019-04-05 DIAGNOSIS — M19042 Primary osteoarthritis, left hand: Secondary | ICD-10-CM | POA: Diagnosis not present

## 2019-04-05 DIAGNOSIS — Z8639 Personal history of other endocrine, nutritional and metabolic disease: Secondary | ICD-10-CM | POA: Diagnosis not present

## 2019-04-05 DIAGNOSIS — Z8669 Personal history of other diseases of the nervous system and sense organs: Secondary | ICD-10-CM | POA: Diagnosis not present

## 2019-04-05 DIAGNOSIS — M19071 Primary osteoarthritis, right ankle and foot: Secondary | ICD-10-CM

## 2019-04-05 DIAGNOSIS — R5383 Other fatigue: Secondary | ICD-10-CM | POA: Diagnosis not present

## 2019-04-05 DIAGNOSIS — M19072 Primary osteoarthritis, left ankle and foot: Secondary | ICD-10-CM | POA: Diagnosis not present

## 2019-04-06 DIAGNOSIS — Z23 Encounter for immunization: Secondary | ICD-10-CM | POA: Diagnosis not present

## 2019-04-09 DIAGNOSIS — Z1211 Encounter for screening for malignant neoplasm of colon: Secondary | ICD-10-CM | POA: Diagnosis not present

## 2019-04-09 DIAGNOSIS — K219 Gastro-esophageal reflux disease without esophagitis: Secondary | ICD-10-CM | POA: Diagnosis not present

## 2019-04-09 DIAGNOSIS — D509 Iron deficiency anemia, unspecified: Secondary | ICD-10-CM | POA: Diagnosis not present

## 2019-04-09 DIAGNOSIS — D125 Benign neoplasm of sigmoid colon: Secondary | ICD-10-CM | POA: Diagnosis not present

## 2019-04-09 DIAGNOSIS — D128 Benign neoplasm of rectum: Secondary | ICD-10-CM | POA: Diagnosis not present

## 2019-04-09 DIAGNOSIS — K635 Polyp of colon: Secondary | ICD-10-CM | POA: Diagnosis not present

## 2019-04-09 DIAGNOSIS — K648 Other hemorrhoids: Secondary | ICD-10-CM | POA: Diagnosis not present

## 2019-04-09 DIAGNOSIS — Z800 Family history of malignant neoplasm of digestive organs: Secondary | ICD-10-CM | POA: Diagnosis not present

## 2019-04-09 DIAGNOSIS — K621 Rectal polyp: Secondary | ICD-10-CM | POA: Diagnosis not present

## 2019-04-09 DIAGNOSIS — K317 Polyp of stomach and duodenum: Secondary | ICD-10-CM | POA: Diagnosis not present

## 2019-04-16 DIAGNOSIS — J3081 Allergic rhinitis due to animal (cat) (dog) hair and dander: Secondary | ICD-10-CM | POA: Diagnosis not present

## 2019-04-16 DIAGNOSIS — J45991 Cough variant asthma: Secondary | ICD-10-CM | POA: Diagnosis not present

## 2019-04-16 DIAGNOSIS — L258 Unspecified contact dermatitis due to other agents: Secondary | ICD-10-CM | POA: Diagnosis not present

## 2019-04-16 DIAGNOSIS — J301 Allergic rhinitis due to pollen: Secondary | ICD-10-CM | POA: Diagnosis not present

## 2019-04-18 DIAGNOSIS — J301 Allergic rhinitis due to pollen: Secondary | ICD-10-CM | POA: Diagnosis not present

## 2019-04-18 DIAGNOSIS — L230 Allergic contact dermatitis due to metals: Secondary | ICD-10-CM | POA: Diagnosis not present

## 2019-04-18 DIAGNOSIS — L233 Allergic contact dermatitis due to drugs in contact with skin: Secondary | ICD-10-CM | POA: Diagnosis not present

## 2019-04-18 DIAGNOSIS — J3089 Other allergic rhinitis: Secondary | ICD-10-CM | POA: Diagnosis not present

## 2019-04-18 DIAGNOSIS — J3081 Allergic rhinitis due to animal (cat) (dog) hair and dander: Secondary | ICD-10-CM | POA: Diagnosis not present

## 2019-04-18 DIAGNOSIS — J45991 Cough variant asthma: Secondary | ICD-10-CM | POA: Diagnosis not present

## 2019-04-26 DIAGNOSIS — J301 Allergic rhinitis due to pollen: Secondary | ICD-10-CM | POA: Diagnosis not present

## 2019-04-26 DIAGNOSIS — J3089 Other allergic rhinitis: Secondary | ICD-10-CM | POA: Diagnosis not present

## 2019-04-26 DIAGNOSIS — J3081 Allergic rhinitis due to animal (cat) (dog) hair and dander: Secondary | ICD-10-CM | POA: Diagnosis not present

## 2019-05-02 DIAGNOSIS — J3089 Other allergic rhinitis: Secondary | ICD-10-CM | POA: Diagnosis not present

## 2019-05-02 DIAGNOSIS — J3081 Allergic rhinitis due to animal (cat) (dog) hair and dander: Secondary | ICD-10-CM | POA: Diagnosis not present

## 2019-05-02 DIAGNOSIS — J301 Allergic rhinitis due to pollen: Secondary | ICD-10-CM | POA: Diagnosis not present

## 2019-05-03 ENCOUNTER — Other Ambulatory Visit: Payer: Self-pay | Admitting: Psychiatry

## 2019-05-03 DIAGNOSIS — F331 Major depressive disorder, recurrent, moderate: Secondary | ICD-10-CM

## 2019-05-04 DIAGNOSIS — J3089 Other allergic rhinitis: Secondary | ICD-10-CM | POA: Diagnosis not present

## 2019-05-04 DIAGNOSIS — J301 Allergic rhinitis due to pollen: Secondary | ICD-10-CM | POA: Diagnosis not present

## 2019-05-04 DIAGNOSIS — J3081 Allergic rhinitis due to animal (cat) (dog) hair and dander: Secondary | ICD-10-CM | POA: Diagnosis not present

## 2019-05-08 DIAGNOSIS — J301 Allergic rhinitis due to pollen: Secondary | ICD-10-CM | POA: Diagnosis not present

## 2019-05-08 DIAGNOSIS — J3089 Other allergic rhinitis: Secondary | ICD-10-CM | POA: Diagnosis not present

## 2019-05-08 DIAGNOSIS — J3081 Allergic rhinitis due to animal (cat) (dog) hair and dander: Secondary | ICD-10-CM | POA: Diagnosis not present

## 2019-05-11 DIAGNOSIS — J301 Allergic rhinitis due to pollen: Secondary | ICD-10-CM | POA: Diagnosis not present

## 2019-05-11 DIAGNOSIS — J3089 Other allergic rhinitis: Secondary | ICD-10-CM | POA: Diagnosis not present

## 2019-05-11 DIAGNOSIS — J3081 Allergic rhinitis due to animal (cat) (dog) hair and dander: Secondary | ICD-10-CM | POA: Diagnosis not present

## 2019-05-15 DIAGNOSIS — J301 Allergic rhinitis due to pollen: Secondary | ICD-10-CM | POA: Diagnosis not present

## 2019-05-15 DIAGNOSIS — J3081 Allergic rhinitis due to animal (cat) (dog) hair and dander: Secondary | ICD-10-CM | POA: Diagnosis not present

## 2019-05-15 DIAGNOSIS — G43709 Chronic migraine without aura, not intractable, without status migrainosus: Secondary | ICD-10-CM | POA: Diagnosis not present

## 2019-05-15 DIAGNOSIS — J3089 Other allergic rhinitis: Secondary | ICD-10-CM | POA: Diagnosis not present

## 2019-05-17 DIAGNOSIS — J3089 Other allergic rhinitis: Secondary | ICD-10-CM | POA: Diagnosis not present

## 2019-05-17 DIAGNOSIS — J3081 Allergic rhinitis due to animal (cat) (dog) hair and dander: Secondary | ICD-10-CM | POA: Diagnosis not present

## 2019-05-17 DIAGNOSIS — J301 Allergic rhinitis due to pollen: Secondary | ICD-10-CM | POA: Diagnosis not present

## 2019-05-22 DIAGNOSIS — J301 Allergic rhinitis due to pollen: Secondary | ICD-10-CM | POA: Diagnosis not present

## 2019-05-22 DIAGNOSIS — J3089 Other allergic rhinitis: Secondary | ICD-10-CM | POA: Diagnosis not present

## 2019-05-22 DIAGNOSIS — J3081 Allergic rhinitis due to animal (cat) (dog) hair and dander: Secondary | ICD-10-CM | POA: Diagnosis not present

## 2019-05-24 DIAGNOSIS — J301 Allergic rhinitis due to pollen: Secondary | ICD-10-CM | POA: Diagnosis not present

## 2019-05-24 DIAGNOSIS — J3081 Allergic rhinitis due to animal (cat) (dog) hair and dander: Secondary | ICD-10-CM | POA: Diagnosis not present

## 2019-05-24 DIAGNOSIS — J3089 Other allergic rhinitis: Secondary | ICD-10-CM | POA: Diagnosis not present

## 2019-05-30 DIAGNOSIS — J3081 Allergic rhinitis due to animal (cat) (dog) hair and dander: Secondary | ICD-10-CM | POA: Diagnosis not present

## 2019-05-30 DIAGNOSIS — J3089 Other allergic rhinitis: Secondary | ICD-10-CM | POA: Diagnosis not present

## 2019-05-30 DIAGNOSIS — J301 Allergic rhinitis due to pollen: Secondary | ICD-10-CM | POA: Diagnosis not present

## 2019-05-31 ENCOUNTER — Other Ambulatory Visit: Payer: Self-pay | Admitting: Psychiatry

## 2019-05-31 ENCOUNTER — Telehealth (INDEPENDENT_AMBULATORY_CARE_PROVIDER_SITE_OTHER): Payer: Medicare Other | Admitting: Psychiatry

## 2019-05-31 ENCOUNTER — Encounter: Payer: Self-pay | Admitting: Psychiatry

## 2019-05-31 DIAGNOSIS — F331 Major depressive disorder, recurrent, moderate: Secondary | ICD-10-CM

## 2019-05-31 DIAGNOSIS — F900 Attention-deficit hyperactivity disorder, predominantly inattentive type: Secondary | ICD-10-CM | POA: Diagnosis not present

## 2019-05-31 DIAGNOSIS — F4010 Social phobia, unspecified: Secondary | ICD-10-CM | POA: Diagnosis not present

## 2019-05-31 DIAGNOSIS — F411 Generalized anxiety disorder: Secondary | ICD-10-CM | POA: Diagnosis not present

## 2019-05-31 DIAGNOSIS — G4710 Hypersomnia, unspecified: Secondary | ICD-10-CM

## 2019-05-31 MED ORDER — MODAFINIL 100 MG PO TABS: 150.0000 mg | ORAL_TABLET | Freq: Every day | ORAL | 5 refills | Status: DC

## 2019-05-31 NOTE — Progress Notes (Signed)
Lauren Bradley [id number] Aug 17, 1950 69 y.o.  I connected with  [name] on 05/31/19 by a video enabled telemedicine application and verified that I am speaking with the correct person using two identifiers.   I discussed the limitations of evaluation and management by telemedicine. The patient expressed understanding and agreed to proceed.    Subjective:   Patient ID:  Lauren Bradley is a 69 y.o. (DOB [birth year]) female.  Chief Complaint:  Chief Complaint  Patient presents with  . Follow-up    med change  . Anxiety  . Depression  . ADHD    Depression        Associated symptoms include myalgias and headaches.  Associated symptoms include no decreased concentration and no suicidal ideas.  [name] presents  today for follow-up of depression, anxiety and insomnia and chronic pain.   At visit Jun 08, 2018.  No major meds were changed except she was trying to gradually increase the Abilify to 2 mg daily for optimal mood effect.  I feel much better on the Abilify.  Makes my brain feel more normal and focus is better and calmer overall.   visit August 31, 2018.  She had asked to increase Abilify from 2 to 3 mg daily.  She had seen Abilify benefit at 2 mg but wished for additional benefit for mood and anxiety.  Last visit November 2020.  The following was noted: Has done well on Abilify 3 mg with additional improvement in mood, but still has a lot of anxiety.  [name] noticed the difference in her mood.  Managed the weight and lost to baseline.  Wonders about the increase to 5 mg to help anxiety.  Benefit for energy which is reasonable with less napping than before Abilify.  Took half Provigil last week when driving distance and did ok with it.   Helped ADHD.  Function is better.  Still some days flounder.  Not as good as paperwork years ago but better with Abilify.  Helped having [name] at home.  Was poor function before Abilify despite trying hard. Abilify helped the  depression clearly and very beneficial.  Not always sad now.  HA resolved.  Initially it caused great energy like a motor running and reduced need for sleep to 5-6 hours.  That has resolved.  SE weight gain without increasing calories.  Changed her sleep schedule for the better.  To bed earlier and up earlier.    I didn't realize how depressed I was.  Better organization and function and productivity.  Abilify helped regulate and correct her sleep wake schedule.  Not oversleeping now. Anxiety remains high chronically.  Can talk too much when anxious and her accountant commented on it to her.  QT has been good for her.  Chronic social anxiety.  Can be crippled from anxiety.   Plan: Trial increase Abilify 5 mg daily in hopes of further reducing anxiety and depressive symptoms.  05/31/2019 appointment the following is noted: Sleep well with naltrexone and alprazolam 0.25 mg HS. Better energy and alertness with modafinil 100 but a little less effect over time.  Some degree of tolerance. Hard to get paperwork done. Done well with aripiprazole 5 mg daily. Helped energy and coping with calmer manner. So much less depressed with Abilify.  Concerns over Hgb A1C which had been borderline in the past and is up a little to 5.9 now.  No extra weight at this point.  Is exercising.  Stressed still dealing with hypochondriac mother with constant somatization and complaining and anxiety and frequently wanting to go to the ER.  Better handling it with Abilify.  Disc management with this. Mother compulsively and annoyingly call her repeatedly.  Feels shame over the feelings she has about her mother.  Major boundary issues from her mother.  Mother was neglectful in her childhood.  Recognizes some blacked out memories from her childhood.  Asked how to deal with this with mother.  Some social anxiety still significant.  Past Psychiatric Medication Trials: Abilify 3 mg helpful, duloxetine 90, venlafaxine irritable,  buspirone side effects, try cyclic antidepressants, Wellbutrin, Zoloft, Paxil, modafinil, Vyvanse 30, Provigil, Ritalin, Adderall AG,  propranolol Belsomra, Lunesta, Ambien, trazodone, alprazolam, doxepin, mirtazapine,  Review of Systems:  Review of Systems  Musculoskeletal: Positive for arthralgias, back pain and myalgias.  Neurological: Positive for headaches. Negative for dizziness, tremors and weakness.  Psychiatric/Behavioral: Positive for depression. Negative for agitation, behavioral problems, confusion, decreased concentration, dysphoric mood, hallucinations, self-injury, sleep disturbance and suicidal ideas. The patient is nervous/anxious. The patient is not hyperactive.     Medications: I have reviewed the patient's current medications.  Current Outpatient Medications  Medication Sig Dispense Refill  . ALPRAZolam (XANAX) 0.5 MG tablet TAKE 2 TABLETS BY MOUTH EVERY NIGHT AT BEDTIME AND 1 TABLET EVERY DAY AS NEEDED (Patient taking differently: Take 0.25 mg by mouth at bedtime. ) 75 tablet 0  . ARIPiprazole (ABILIFY) 5 MG tablet TAKE 1 TABLET(5 MG) BY MOUTH DAILY 90 tablet 0  . azelastine (ASTELIN) 0.1 % nasal spray     . calcium carbonate (CALCIUM 600) 600 MG TABS tablet Take 600 mg by mouth.    . DULoxetine (CYMBALTA) 30 MG capsule TAKE 1 CAPSULE(30 MG) BY MOUTH DAILY 90 capsule 0  . DULoxetine (CYMBALTA) 60 MG capsule TAKE 1 CAPSULE BY MOUTH DAILY WITH 30 MG CAPSULES OF DULOXETINE 90 capsule 0  . EPINEPHrine (EPIPEN JR) 0.15 MG/0.3ML injection     . estradiol (VIVELLE-DOT) 0.075 MG/24HR estradiol 0.075 mg/24 hr semiweekly transdermal patch  APPLY 1 PATCH TO SKIN TWICE WEEKLY AS DIRECTED    . Levomefolate Glucosamine (METHYLFOLATE PO) Take by mouth.    Marland Kitchen MAGNESIUM MALATE PO Take by mouth daily.    . modafinil (PROVIGIL) 100 MG tablet Take 1.5 tablets (150 mg total) by mouth daily. 45 tablet 5  . NP THYROID 15 MG tablet     . progesterone (PROMETRIUM) 200 MG capsule Take by mouth.     . sodium fluoride (FLUORISHIELD) 1.1 % GEL dental gel SF 5000 Plus 1.1 % dental cream    . SUMAtriptan (IMITREX) 100 MG tablet TAKE 1 TABLET BY MOUTH AT ONSET OF HEADACHE. MAY REPEAT IN 2 HOURS. MAX 2 TABLETS PER DAY    . thyroid (NP THYROID) 90 MG tablet TAKE 1 TABLET BY MOUTH EVERY DAY ON AN EMPTY STOMACH WITH 15 MG DOSE    . tretinoin (RETIN-A) 0.025 % cream APPLY A PEA SIZED AMOUNT TO FACE IN THE EVENING    . Vitamin D, Ergocalciferol, (DRISDOL) 50000 units CAPS capsule Take 50,000 Units by mouth every 7 (seven) days.      No current facility-administered medications for this visit.    Medication Side Effects: HA stopped with Abilify, weight gain, hungry is odd  Allergies:  Allergies  Allergen Reactions  . Hydrocodone Itching  . Codeine Nausea And Vomiting  . Other   . Sulfa Antibiotics   . Cephalosporins Rash  . Sulfamethoxazole Rash  Unknown reaction    Past Medical History:  Diagnosis Date  . Asthma   . DDD (degenerative disc disease), cervical   . DDD (degenerative disc disease), lumbar   . Fatigue   . Fibromyalgia   . Infertility, female   . Osteoarthritis     Family History  Problem Relation Age of Onset  . Fibromyalgia Mother   . Kidney failure Mother   . Mental illness Mother   . Heart disease Father   . COPD Father   . Cancer Father        colon, liver   . Hypothyroidism Son     Social History   Socioeconomic History  . Marital status: Single    Spouse name: Not on file  . Number of children: Not on file  . Years of education: Not on file  . Highest education level: Not on file  Occupational History  . Not on file  Tobacco Use  . Smoking status: Never Smoker  . Smokeless tobacco: Never Used  Substance and Sexual Activity  . Alcohol use: Yes    Comment: "Very little"  . Drug use: No  . Sexual activity: Not on file  Other Topics Concern  . Not on file  Social History Narrative  . Not on file   Social Determinants of Health    Financial Resource Strain:   . Difficulty of Paying Living Expenses:   Food Insecurity:   . Worried About Charity fundraiser in the Last Year:   . Arboriculturist in the Last Year:   Transportation Needs:   . Film/video editor (Medical):   Marland Kitchen Lack of Transportation (Non-Medical):   Physical Activity:   . Days of Exercise per Week:   . Minutes of Exercise per Session:   Stress:   . Feeling of Stress :   Social Connections:   . Frequency of Communication with Friends and Family:   . Frequency of Social Gatherings with Friends and Family:   . Attends Religious Services:   . Active Member of Clubs or Organizations:   . Attends Archivist Meetings:   Marland Kitchen Marital Status:   Intimate Partner Violence:   . Fear of Current or Ex-Partner:   . Emotionally Abused:   Marland Kitchen Physically Abused:   . Sexually Abused:     Past Medical History, Surgical history, Social history, and Family history were reviewed and updated as appropriate.   Please see review of systems for further details on the patient's review from today.   Objective:   Physical Exam:  There were no vitals taken for this visit.  Physical Exam Neurological:     Mental Status: She is alert and oriented to person, place, and time.     Cranial Nerves: No dysarthria.  Psychiatric:        Attention and Perception: Attention normal.        Mood and Affect: Mood is anxious. Mood is not depressed.        Speech: Speech normal.        Behavior: Behavior is cooperative.        Thought Content: Thought content normal. Thought content is not paranoid or delusional. Thought content does not include homicidal or suicidal ideation. Thought content does not include homicidal or suicidal plan.        Cognition and Memory: Cognition and memory normal.        Judgment: Judgment normal.     Comments: Insight fair to good.  Depression has almost resolved with the Abilify for the most part.  Anxiety remains high.     Lab  Review:     Component Value Date/Time   NA 137 02/21/2017 1635   K 4.3 02/21/2017 1635   CL 102 02/21/2017 1635   CO2 29 02/21/2017 1635   GLUCOSE 88 02/21/2017 1635   BUN 16 02/21/2017 1635   CREATININE 0.58 02/21/2017 1635   CALCIUM 9.4 02/21/2017 1635   PROT 6.8 02/21/2017 1635   ALBUMIN 4.4 12/30/2015 1507   AST 22 02/21/2017 1635   ALT 20 02/21/2017 1635   ALKPHOS 28 (L) 12/30/2015 1507   BILITOT 0.3 02/21/2017 1635   GFRNONAA 97 02/21/2017 1635   GFRAA 112 02/21/2017 1635       Component Value Date/Time   WBC 6.6 02/21/2017 1635   RBC 4.36 02/21/2017 1635   HGB 12.9 02/21/2017 1635   HCT 38.2 02/21/2017 1635   PLT 327 02/21/2017 1635   MCV 87.6 02/21/2017 1635   MCH 29.6 02/21/2017 1635   MCHC 33.8 02/21/2017 1635   RDW 12.4 02/21/2017 1635   LYMPHSABS 1,228 02/21/2017 1635   MONOABS 402 12/30/2015 1507   EOSABS 132 02/21/2017 1635   BASOSABS 73 02/21/2017 1635    No results found for: POCLITH, LITHIUM   No results found for: PHENYTOIN, PHENOBARB, VALPROATE, CBMZ   .res Assessment: Plan:    Major depressive disorder, recurrent episode, moderate (HCC)  Attention deficit hyperactivity disorder (ADHD), predominantly inattentive type - Plan: modafinil (PROVIGIL) 100 MG tablet  Generalized anxiety disorder  Hypersomnolence disorder, persistent - Plan: modafinil (PROVIGIL) 100 MG tablet  Social anxiety disorder   Less depressed with the Abilify clear benefit for depression and anxiety.   It's helped productivity and mood and energy and less hypersomnolence. Further improvement with increase Abilify to 5 mg daily.  Disc glucose and A1c again.Marland Kitchen   Option check labs and Hgb A1C bc in past was prediabetic. She's using buffered berberine.  She'd like to hold off on metformin.  We discussed the short-term risks associated with benzodiazepines including sedation and increased fall risk among others.  Discussed long-term side effect risk including dependence,  potential withdrawal symptoms, and the potential eventual dose-related risk of dementia.  But recent studies from 2020 dispute this association between benzodiazepines and dementia risk. Newer studies in 2020 do not support an association with dementia. She was successful at reducing Xanax for sleep.  Discussed potential metabolic side effects associated with atypical antipsychotics, as well as potential risk for movement side effects. Advised pt to contact office if movement side effects occur.   Later could consider reducing duloxetine for cost reasons. Continue duloxetine 90.Marland Kitchen  Answered questions about types of anti-acid meds and risk dementia.  Increase modafinil 150 mg for hypersomnolence and ADD.  She doesn't tolerate traditional stimulants like Adderall and MPH.. Protect sleep and try to get more if possible.  Stressed still dealing with hypochondriac mother with constant somatization and complaining and anxiety and frequently wanting to go to the ER.  Disc management with this. Stress of dealing with the lying and manipulation of her mother.  Disc this in therapy. And Family problems.  This is a chronic problem.  Remains very stressed with mother.  "Driving me out of my mind!"  Disc this in detail and how to handle her manipulation and hypochondriasis.  Having to help her a lot.  Disc limit setting and boundaries.  Disc regular scheduling PCP visits.    Disc questions  she raised over a relationship possibility and how to adjust.    45 min appt  FU 3-4 mos  Lynder Parents, MD, DFAPA   Please see After Visit Summary for patient specific instructions.  No future appointments.  No orders of the defined types were placed in this encounter.     -------------------------------

## 2019-05-31 NOTE — Telephone Encounter (Signed)
Last apt 11/2018 was due back 8 weeks

## 2019-06-04 DIAGNOSIS — J3089 Other allergic rhinitis: Secondary | ICD-10-CM | POA: Diagnosis not present

## 2019-06-04 DIAGNOSIS — J301 Allergic rhinitis due to pollen: Secondary | ICD-10-CM | POA: Diagnosis not present

## 2019-06-04 DIAGNOSIS — J3081 Allergic rhinitis due to animal (cat) (dog) hair and dander: Secondary | ICD-10-CM | POA: Diagnosis not present

## 2019-06-06 DIAGNOSIS — J3089 Other allergic rhinitis: Secondary | ICD-10-CM | POA: Diagnosis not present

## 2019-06-06 DIAGNOSIS — J301 Allergic rhinitis due to pollen: Secondary | ICD-10-CM | POA: Diagnosis not present

## 2019-06-06 DIAGNOSIS — J3081 Allergic rhinitis due to animal (cat) (dog) hair and dander: Secondary | ICD-10-CM | POA: Diagnosis not present

## 2019-06-11 ENCOUNTER — Other Ambulatory Visit: Payer: Self-pay | Admitting: Psychiatry

## 2019-06-13 DIAGNOSIS — J3081 Allergic rhinitis due to animal (cat) (dog) hair and dander: Secondary | ICD-10-CM | POA: Diagnosis not present

## 2019-06-13 DIAGNOSIS — J3089 Other allergic rhinitis: Secondary | ICD-10-CM | POA: Diagnosis not present

## 2019-06-13 DIAGNOSIS — J301 Allergic rhinitis due to pollen: Secondary | ICD-10-CM | POA: Diagnosis not present

## 2019-06-21 DIAGNOSIS — J3081 Allergic rhinitis due to animal (cat) (dog) hair and dander: Secondary | ICD-10-CM | POA: Diagnosis not present

## 2019-06-21 DIAGNOSIS — L748 Other eccrine sweat disorders: Secondary | ICD-10-CM | POA: Diagnosis not present

## 2019-06-21 DIAGNOSIS — Z411 Encounter for cosmetic surgery: Secondary | ICD-10-CM | POA: Diagnosis not present

## 2019-06-21 DIAGNOSIS — J301 Allergic rhinitis due to pollen: Secondary | ICD-10-CM | POA: Diagnosis not present

## 2019-06-21 DIAGNOSIS — R234 Changes in skin texture: Secondary | ICD-10-CM | POA: Diagnosis not present

## 2019-06-21 DIAGNOSIS — L603 Nail dystrophy: Secondary | ICD-10-CM | POA: Diagnosis not present

## 2019-06-21 DIAGNOSIS — J3089 Other allergic rhinitis: Secondary | ICD-10-CM | POA: Diagnosis not present

## 2019-06-21 DIAGNOSIS — L570 Actinic keratosis: Secondary | ICD-10-CM | POA: Diagnosis not present

## 2019-06-25 DIAGNOSIS — J3081 Allergic rhinitis due to animal (cat) (dog) hair and dander: Secondary | ICD-10-CM | POA: Diagnosis not present

## 2019-06-25 DIAGNOSIS — J301 Allergic rhinitis due to pollen: Secondary | ICD-10-CM | POA: Diagnosis not present

## 2019-06-25 DIAGNOSIS — J3089 Other allergic rhinitis: Secondary | ICD-10-CM | POA: Diagnosis not present

## 2019-07-11 DIAGNOSIS — M152 Bouchard's nodes (with arthropathy): Secondary | ICD-10-CM | POA: Diagnosis not present

## 2019-07-11 DIAGNOSIS — D509 Iron deficiency anemia, unspecified: Secondary | ICD-10-CM | POA: Diagnosis not present

## 2019-07-11 DIAGNOSIS — E039 Hypothyroidism, unspecified: Secondary | ICD-10-CM | POA: Diagnosis not present

## 2019-07-11 DIAGNOSIS — M159 Polyosteoarthritis, unspecified: Secondary | ICD-10-CM | POA: Diagnosis not present

## 2019-07-11 DIAGNOSIS — I10 Essential (primary) hypertension: Secondary | ICD-10-CM | POA: Diagnosis not present

## 2019-07-11 DIAGNOSIS — M8580 Other specified disorders of bone density and structure, unspecified site: Secondary | ICD-10-CM | POA: Diagnosis not present

## 2019-07-11 DIAGNOSIS — J4520 Mild intermittent asthma, uncomplicated: Secondary | ICD-10-CM | POA: Diagnosis not present

## 2019-07-24 DIAGNOSIS — J301 Allergic rhinitis due to pollen: Secondary | ICD-10-CM | POA: Diagnosis not present

## 2019-07-24 DIAGNOSIS — J3081 Allergic rhinitis due to animal (cat) (dog) hair and dander: Secondary | ICD-10-CM | POA: Diagnosis not present

## 2019-07-24 DIAGNOSIS — J3089 Other allergic rhinitis: Secondary | ICD-10-CM | POA: Diagnosis not present

## 2019-07-27 ENCOUNTER — Other Ambulatory Visit: Payer: Self-pay

## 2019-07-27 MED ORDER — DULOXETINE HCL 30 MG PO CPEP: ORAL_CAPSULE | ORAL | 0 refills | Status: DC

## 2019-07-27 MED ORDER — DULOXETINE HCL 60 MG PO CPEP: ORAL_CAPSULE | ORAL | 0 refills | Status: DC

## 2019-07-31 DIAGNOSIS — M152 Bouchard's nodes (with arthropathy): Secondary | ICD-10-CM | POA: Diagnosis not present

## 2019-07-31 DIAGNOSIS — I10 Essential (primary) hypertension: Secondary | ICD-10-CM | POA: Diagnosis not present

## 2019-07-31 DIAGNOSIS — J4520 Mild intermittent asthma, uncomplicated: Secondary | ICD-10-CM | POA: Diagnosis not present

## 2019-07-31 DIAGNOSIS — D509 Iron deficiency anemia, unspecified: Secondary | ICD-10-CM | POA: Diagnosis not present

## 2019-07-31 DIAGNOSIS — M159 Polyosteoarthritis, unspecified: Secondary | ICD-10-CM | POA: Diagnosis not present

## 2019-07-31 DIAGNOSIS — E039 Hypothyroidism, unspecified: Secondary | ICD-10-CM | POA: Diagnosis not present

## 2019-07-31 DIAGNOSIS — M8580 Other specified disorders of bone density and structure, unspecified site: Secondary | ICD-10-CM | POA: Diagnosis not present

## 2019-08-02 DIAGNOSIS — J301 Allergic rhinitis due to pollen: Secondary | ICD-10-CM | POA: Diagnosis not present

## 2019-08-02 DIAGNOSIS — J3089 Other allergic rhinitis: Secondary | ICD-10-CM | POA: Diagnosis not present

## 2019-08-02 DIAGNOSIS — J3081 Allergic rhinitis due to animal (cat) (dog) hair and dander: Secondary | ICD-10-CM | POA: Diagnosis not present

## 2019-08-13 DIAGNOSIS — J3081 Allergic rhinitis due to animal (cat) (dog) hair and dander: Secondary | ICD-10-CM | POA: Diagnosis not present

## 2019-08-13 DIAGNOSIS — J301 Allergic rhinitis due to pollen: Secondary | ICD-10-CM | POA: Diagnosis not present

## 2019-08-13 DIAGNOSIS — J3089 Other allergic rhinitis: Secondary | ICD-10-CM | POA: Diagnosis not present

## 2019-08-16 DIAGNOSIS — G43709 Chronic migraine without aura, not intractable, without status migrainosus: Secondary | ICD-10-CM | POA: Diagnosis not present

## 2019-08-21 DIAGNOSIS — J3081 Allergic rhinitis due to animal (cat) (dog) hair and dander: Secondary | ICD-10-CM | POA: Diagnosis not present

## 2019-08-21 DIAGNOSIS — J3089 Other allergic rhinitis: Secondary | ICD-10-CM | POA: Diagnosis not present

## 2019-08-21 DIAGNOSIS — J301 Allergic rhinitis due to pollen: Secondary | ICD-10-CM | POA: Diagnosis not present

## 2019-08-27 ENCOUNTER — Telehealth: Payer: Self-pay | Admitting: Rheumatology

## 2019-08-27 NOTE — Telephone Encounter (Signed)
Please schedule a sooner office visit for further evaluation.

## 2019-08-27 NOTE — Telephone Encounter (Signed)
Patient called stating her left hand thumb has been cramping when she tries to grip.  Patient states this is a new symptom and is not sure if it is related to her arthritis.  Patient also states that she still has the rash on the back of her neck that has been there for approximately 4 years.  Patient states her dermatologist took a biopsy a while ago and it showed inflammation cells and referred her to an allergist.  Patient states there has been no answer what is causing the rash.  Patient states the rash has increased in size and never goes away.  Patient states she has tried cortisone cream, but it hasn't helped.

## 2019-08-28 NOTE — Telephone Encounter (Signed)
Attempted to contact the patient and left message for patient to call the office.  

## 2019-08-29 DIAGNOSIS — E039 Hypothyroidism, unspecified: Secondary | ICD-10-CM | POA: Diagnosis not present

## 2019-08-29 DIAGNOSIS — M152 Bouchard's nodes (with arthropathy): Secondary | ICD-10-CM | POA: Diagnosis not present

## 2019-08-29 DIAGNOSIS — Z1389 Encounter for screening for other disorder: Secondary | ICD-10-CM | POA: Diagnosis not present

## 2019-08-29 DIAGNOSIS — K219 Gastro-esophageal reflux disease without esophagitis: Secondary | ICD-10-CM | POA: Diagnosis not present

## 2019-08-29 DIAGNOSIS — R05 Cough: Secondary | ICD-10-CM | POA: Diagnosis not present

## 2019-08-29 DIAGNOSIS — R252 Cramp and spasm: Secondary | ICD-10-CM | POA: Diagnosis not present

## 2019-08-29 DIAGNOSIS — R7301 Impaired fasting glucose: Secondary | ICD-10-CM | POA: Diagnosis not present

## 2019-08-29 DIAGNOSIS — D509 Iron deficiency anemia, unspecified: Secondary | ICD-10-CM | POA: Diagnosis not present

## 2019-08-29 DIAGNOSIS — J4520 Mild intermittent asthma, uncomplicated: Secondary | ICD-10-CM | POA: Diagnosis not present

## 2019-08-29 DIAGNOSIS — Z0000 Encounter for general adult medical examination without abnormal findings: Secondary | ICD-10-CM | POA: Diagnosis not present

## 2019-08-29 NOTE — Telephone Encounter (Signed)
Patient has been schedule for 09/10/2019.

## 2019-08-29 NOTE — Telephone Encounter (Signed)
Attempted to contact the patient and left message for patient to call the office.  

## 2019-08-31 ENCOUNTER — Telehealth: Payer: Self-pay | Admitting: Psychiatry

## 2019-08-31 DIAGNOSIS — J3089 Other allergic rhinitis: Secondary | ICD-10-CM | POA: Diagnosis not present

## 2019-08-31 DIAGNOSIS — J3081 Allergic rhinitis due to animal (cat) (dog) hair and dander: Secondary | ICD-10-CM | POA: Diagnosis not present

## 2019-08-31 NOTE — Telephone Encounter (Signed)
[facility] called requesting refill for Duloxetine 30 mg and Duloxetine 60 mg. Return call at [phone] Roselyn Reef). Apt 8/31

## 2019-09-03 ENCOUNTER — Other Ambulatory Visit: Payer: Self-pay

## 2019-09-03 DIAGNOSIS — M7742 Metatarsalgia, left foot: Secondary | ICD-10-CM | POA: Diagnosis not present

## 2019-09-03 DIAGNOSIS — M7741 Metatarsalgia, right foot: Secondary | ICD-10-CM | POA: Diagnosis not present

## 2019-09-03 DIAGNOSIS — M25372 Other instability, left ankle: Secondary | ICD-10-CM | POA: Diagnosis not present

## 2019-09-03 MED ORDER — DULOXETINE HCL 30 MG PO CPEP: ORAL_CAPSULE | ORAL | 0 refills | Status: DC

## 2019-09-03 MED ORDER — DULOXETINE HCL 60 MG PO CPEP: ORAL_CAPSULE | ORAL | 0 refills | Status: DC

## 2019-09-03 NOTE — Telephone Encounter (Signed)
Previously Rx's sent to [facility] will send to [facility] per request

## 2019-09-04 DIAGNOSIS — M159 Polyosteoarthritis, unspecified: Secondary | ICD-10-CM | POA: Diagnosis not present

## 2019-09-04 DIAGNOSIS — J3089 Other allergic rhinitis: Secondary | ICD-10-CM | POA: Diagnosis not present

## 2019-09-04 DIAGNOSIS — M152 Bouchard's nodes (with arthropathy): Secondary | ICD-10-CM | POA: Diagnosis not present

## 2019-09-04 DIAGNOSIS — F322 Major depressive disorder, single episode, severe without psychotic features: Secondary | ICD-10-CM | POA: Diagnosis not present

## 2019-09-04 DIAGNOSIS — M8580 Other specified disorders of bone density and structure, unspecified site: Secondary | ICD-10-CM | POA: Diagnosis not present

## 2019-09-04 DIAGNOSIS — D509 Iron deficiency anemia, unspecified: Secondary | ICD-10-CM | POA: Diagnosis not present

## 2019-09-04 DIAGNOSIS — I10 Essential (primary) hypertension: Secondary | ICD-10-CM | POA: Diagnosis not present

## 2019-09-04 DIAGNOSIS — J3081 Allergic rhinitis due to animal (cat) (dog) hair and dander: Secondary | ICD-10-CM | POA: Diagnosis not present

## 2019-09-04 DIAGNOSIS — E039 Hypothyroidism, unspecified: Secondary | ICD-10-CM | POA: Diagnosis not present

## 2019-09-04 DIAGNOSIS — J4520 Mild intermittent asthma, uncomplicated: Secondary | ICD-10-CM | POA: Diagnosis not present

## 2019-09-05 DIAGNOSIS — J3089 Other allergic rhinitis: Secondary | ICD-10-CM | POA: Diagnosis not present

## 2019-09-05 DIAGNOSIS — J301 Allergic rhinitis due to pollen: Secondary | ICD-10-CM | POA: Diagnosis not present

## 2019-09-05 DIAGNOSIS — J3081 Allergic rhinitis due to animal (cat) (dog) hair and dander: Secondary | ICD-10-CM | POA: Diagnosis not present

## 2019-09-10 ENCOUNTER — Ambulatory Visit: Payer: Medicare Other | Admitting: Physician Assistant

## 2019-09-11 ENCOUNTER — Encounter: Payer: Self-pay | Admitting: Psychiatry

## 2019-09-11 ENCOUNTER — Telehealth (INDEPENDENT_AMBULATORY_CARE_PROVIDER_SITE_OTHER): Payer: Medicare Other | Admitting: Psychiatry

## 2019-09-11 DIAGNOSIS — F4321 Adjustment disorder with depressed mood: Secondary | ICD-10-CM | POA: Diagnosis not present

## 2019-09-11 DIAGNOSIS — F411 Generalized anxiety disorder: Secondary | ICD-10-CM

## 2019-09-11 DIAGNOSIS — J301 Allergic rhinitis due to pollen: Secondary | ICD-10-CM | POA: Diagnosis not present

## 2019-09-11 DIAGNOSIS — F331 Major depressive disorder, recurrent, moderate: Secondary | ICD-10-CM | POA: Diagnosis not present

## 2019-09-11 DIAGNOSIS — F900 Attention-deficit hyperactivity disorder, predominantly inattentive type: Secondary | ICD-10-CM

## 2019-09-11 DIAGNOSIS — G4710 Hypersomnia, unspecified: Secondary | ICD-10-CM | POA: Diagnosis not present

## 2019-09-11 DIAGNOSIS — F4010 Social phobia, unspecified: Secondary | ICD-10-CM | POA: Diagnosis not present

## 2019-09-11 DIAGNOSIS — J3081 Allergic rhinitis due to animal (cat) (dog) hair and dander: Secondary | ICD-10-CM | POA: Diagnosis not present

## 2019-09-11 DIAGNOSIS — J3089 Other allergic rhinitis: Secondary | ICD-10-CM | POA: Diagnosis not present

## 2019-09-11 MED ORDER — ARIPIPRAZOLE 5 MG PO TABS: 5.0000 mg | ORAL_TABLET | Freq: Every day | ORAL | 1 refills | Status: DC

## 2019-09-11 MED ORDER — DULOXETINE HCL 30 MG PO CPEP: ORAL_CAPSULE | ORAL | 1 refills | Status: DC

## 2019-09-11 MED ORDER — DULOXETINE HCL 60 MG PO CPEP: ORAL_CAPSULE | ORAL | 1 refills | Status: DC

## 2019-09-11 NOTE — Progress Notes (Signed)
Lauren Bradley [id number] [birth year] 69 y.o.  I connected with  Lauren Bradley on 09/11/19 by a video enabled telemedicine application and verified that I am speaking with the correct person using two identifiers.   I discussed the limitations of evaluation and management by telemedicine. The patient expressed understanding and agreed to proceed.    Subjective:   Patient ID:  Lauren Bradley is a 69 y.o. (DOB 04-16-1950) female.  Chief Complaint:  Chief Complaint  Patient presents with  . Follow-up  . Anxiety  . Depression  . Stress    care for mother  . ADD    Depression        Associated symptoms include myalgias and headaches.  Associated symptoms include no decreased concentration and no suicidal ideas.  Lauren Bradley presents  today for follow-up of depression, anxiety and insomnia and chronic pain.   At visit Jun 08, 2018.  No major meds were changed except she was trying to gradually increase the Abilify to 2 mg daily for optimal mood effect.  I feel much better on the Abilify.  Makes my brain feel more normal and focus is better and calmer overall.   visit August 31, 2018.  She had asked to increase Abilify from 2 to 3 mg daily.  She had seen Abilify benefit at 2 mg but wished for additional benefit for mood and anxiety.  Last visit November 2020.  The following was noted: Has done well on Abilify 3 mg with additional improvement in mood, but still has a lot of anxiety.  [name] noticed the difference in her mood.  Managed the weight and lost to baseline.  Wonders about the increase to 5 mg to help anxiety.  Benefit for energy which is reasonable with less napping than before Abilify.  Took half Provigil last week when driving distance and did ok with it.   Helped ADHD.  Function is better.  Still some days flounder.  Not as good as paperwork years ago but better with Abilify.  Helped having Lauren Bradley at home.  Was poor function before Abilify despite trying hard. Abilify  helped the depression clearly and very beneficial.  Not always sad now.  HA resolved.  Initially it caused great energy like a motor running and reduced need for sleep to 5-6 hours.  That has resolved.  SE weight gain without increasing calories.  Changed her sleep schedule for the better.  To bed earlier and up earlier.    I didn't realize how depressed I was.  Better organization and function and productivity.  Abilify helped regulate and correct her sleep wake schedule.  Not oversleeping now. Anxiety remains high chronically.  Can talk too much when anxious and her accountant commented on it to her.  QT has been good for her.  Chronic social anxiety.  Can be crippled from anxiety.   Plan: Trial increase Abilify 5 mg daily in hopes of further reducing anxiety and depressive symptoms.  05/31/2019 appointment the following is noted: Sleep well with naltrexone and alprazolam 0.25 mg HS. Better energy and alertness with modafinil 100 but a little less effect over time.  Some degree of tolerance. Hard to get paperwork done. Done well with aripiprazole 5 mg daily. Helped energy and coping with calmer manner. So much less depressed with Abilify.  Concerns over Hgb A1C which had been borderline in the past and is up a little to 5.9 now.  No extra weight at this point.  Is exercising.  Stressed still dealing with hypochondriac mother with constant somatization and complaining and anxiety and frequently wanting to go to the ER.  Better handling it with Abilify.  Disc management with this. Mother compulsively and annoyingly call her repeatedly.  Feels shame over the feelings she has about her mother.  Major boundary issues from her mother.  Mother was neglectful in her childhood.  Recognizes some blacked out memories from her childhood.  Asked how to deal with this with mother.  09/11/19 appt with the following noted: Not quite as well with stress from mother bc B doesn't help much. Concerned about Abilify  poss effecting glucose.  Did have high AIC in past even before Abilify.  Has to watch carbs.  Plans to see PCP Lauren Bradley again about it. Poor heat tolerance this year.  Wonders if related to modafinil.  Went up to 150 mg and didn't notice a lot of issues.   No SE otherwise.  Is able to do some paperwork more effectively lately.  More pain intolerant as well. No problems with sleep affected by modafinil.  2 dogs sleep with her.  Cough can wake her worse off tramadol.   Lost her dog to pancreatic CA.  Good help with homeopathic vet.  Feels guilty he had fever and she didn't realize it at the time.   Some social anxiety still significant.  Past Psychiatric Medication Trials: Abilify 3 mg helpful, duloxetine 90, venlafaxine irritable, buspirone side effects, try cyclic antidepressants, Wellbutrin, Zoloft, Paxil, Modafinil 150, Vyvanse 30, Provigil, Ritalin, Adderall AG,  propranolol Belsomra, Lunesta, Ambien, trazodone, alprazolam, doxepin, mirtazapine,  Review of Systems:  Review of Systems  Respiratory: Positive for cough and shortness of breath.   Musculoskeletal: Positive for arthralgias, back pain and myalgias.  Neurological: Positive for headaches. Negative for dizziness, tremors and weakness.  Psychiatric/Behavioral: Positive for depression. Negative for agitation, behavioral problems, confusion, decreased concentration, dysphoric mood, hallucinations, self-injury, sleep disturbance and suicidal ideas. The patient is nervous/anxious. The patient is not hyperactive.     Medications: I have reviewed the patient's current medications.  Current Outpatient Medications  Medication Sig Dispense Refill  . ALPRAZolam (XANAX) 0.5 MG tablet TAKE 2 TABLETS BY MOUTH EVERY NIGHT AT BEDTIME AND 1 TABLET EVERY DAY AS NEEDED (Patient taking differently: Take 0.25 mg by mouth at bedtime. ) 75 tablet 0  . azelastine (ASTELIN) 0.1 % nasal spray     . calcium carbonate (CALCIUM 600) 600 MG TABS tablet Take  600 mg by mouth.    . EPINEPHrine (EPIPEN JR) 0.15 MG/0.3ML injection     . estradiol (VIVELLE-DOT) 0.075 MG/24HR estradiol 0.075 mg/24 hr semiweekly transdermal patch  APPLY 1 PATCH TO SKIN TWICE WEEKLY AS DIRECTED    . Levomefolate Glucosamine (METHYLFOLATE PO) Take by mouth.    Marland Kitchen MAGNESIUM MALATE PO Take by mouth daily.    . modafinil (PROVIGIL) 100 MG tablet Take 1.5 tablets (150 mg total) by mouth daily. 45 tablet 5  . NP THYROID 15 MG tablet     . progesterone (PROMETRIUM) 200 MG capsule Take by mouth.    . sodium fluoride (FLUORISHIELD) 1.1 % GEL dental gel SF 5000 Plus 1.1 % dental cream    . SUMAtriptan (IMITREX) 100 MG tablet TAKE 1 TABLET BY MOUTH AT ONSET OF HEADACHE. MAY REPEAT IN 2 HOURS. MAX 2 TABLETS PER DAY    . thyroid (NP THYROID) 90 MG tablet TAKE 1 TABLET BY MOUTH EVERY DAY ON AN EMPTY STOMACH WITH 15 MG DOSE    .  tretinoin (RETIN-A) 0.025 % cream APPLY A PEA SIZED AMOUNT TO FACE IN THE EVENING    . Vitamin D, Ergocalciferol, (DRISDOL) 50000 units CAPS capsule Take 50,000 Units by mouth every 7 (seven) days.     . ARIPiprazole (ABILIFY) 5 MG tablet Take 1 tablet (5 mg total) by mouth daily. 90 tablet 1  . DULoxetine (CYMBALTA) 30 MG capsule Take 1 capsule (30 mg) by mouth daily with 1 capsules (60 mg) 90 capsule 1  . DULoxetine (CYMBALTA) 60 MG capsule TAKE ONE CAPSULE BY MOUTH DAILY WITH 30 MG CAPSULE OF DULOXETINE 90 capsule 1   No current facility-administered medications for this visit.    Medication Side Effects: HA stopped with Abilify, weight gain, hungry is odd  Allergies:  Allergies  Allergen Reactions  . Hydrocodone Itching  . Codeine Nausea And Vomiting  . Other   . Sulfa Antibiotics   . Cephalosporins Rash  . Sulfamethoxazole Rash    Unknown reaction    Past Medical History:  Diagnosis Date  . Asthma   . DDD (degenerative disc disease), cervical   . DDD (degenerative disc disease), lumbar   . Fatigue   . Fibromyalgia   . Infertility, female    . Osteoarthritis     Family History  Problem Relation Age of Onset  . Fibromyalgia Mother   . Kidney failure Mother   . Mental illness Mother   . Heart disease Father   . COPD Father   . Cancer Father        colon, liver   . Hypothyroidism Son     Social History   Socioeconomic History  . Marital status: Single    Spouse name: Not on file  . Number of children: Not on file  . Years of education: Not on file  . Highest education level: Not on file  Occupational History  . Not on file  Tobacco Use  . Smoking status: Never Smoker  . Smokeless tobacco: Never Used  Vaping Use  . Vaping Use: Never used  Substance and Sexual Activity  . Alcohol use: Yes    Comment: "Very little"  . Drug use: No  . Sexual activity: Not on file  Other Topics Concern  . Not on file  Social History Narrative  . Not on file   Social Determinants of Health   Financial Resource Strain:   . Difficulty of Paying Living Expenses: Not on file  Food Insecurity:   . Worried About Charity fundraiser in the Last Year: Not on file  . Ran Out of Food in the Last Year: Not on file  Transportation Needs:   . Lack of Transportation (Medical): Not on file  . Lack of Transportation (Non-Medical): Not on file  Physical Activity:   . Days of Exercise per Week: Not on file  . Minutes of Exercise per Session: Not on file  Stress:   . Feeling of Stress : Not on file  Social Connections:   . Frequency of Communication with Friends and Family: Not on file  . Frequency of Social Gatherings with Friends and Family: Not on file  . Attends Religious Services: Not on file  . Active Member of Clubs or Organizations: Not on file  . Attends Archivist Meetings: Not on file  . Marital Status: Not on file  Intimate Partner Violence:   . Fear of Current or Ex-Partner: Not on file  . Emotionally Abused: Not on file  . Physically Abused: Not on  file  . Sexually Abused: Not on file    Past Medical  History, Surgical history, Social history, and Family history were reviewed and updated as appropriate.   Please see review of systems for further details on the patient's review from today.   Objective:   Physical Exam:  There were no vitals taken for this visit.  Physical Exam Neurological:     Mental Status: She is alert and oriented to person, place, and time.     Cranial Nerves: No dysarthria.  Psychiatric:        Attention and Perception: Attention normal.        Mood and Affect: Mood is anxious. Mood is not depressed.        Speech: Speech normal. Speech is not slurred.        Behavior: Behavior is cooperative.        Thought Content: Thought content normal. Thought content is not paranoid or delusional. Thought content does not include homicidal or suicidal ideation. Thought content does not include homicidal or suicidal plan.        Cognition and Memory: Cognition and memory normal.        Judgment: Judgment normal.     Comments: Insight fair to good.  Depression has almost resolved with the Abilify for the most part.  Anxiety remains high.     Lab Review:     Component Value Date/Time   NA 137 02/21/2017 1635   K 4.3 02/21/2017 1635   CL 102 02/21/2017 1635   CO2 29 02/21/2017 1635   GLUCOSE 88 02/21/2017 1635   BUN 16 02/21/2017 1635   CREATININE 0.58 02/21/2017 1635   CALCIUM 9.4 02/21/2017 1635   PROT 6.8 02/21/2017 1635   ALBUMIN 4.4 12/30/2015 1507   AST 22 02/21/2017 1635   ALT 20 02/21/2017 1635   ALKPHOS 28 (L) 12/30/2015 1507   BILITOT 0.3 02/21/2017 1635   GFRNONAA 97 02/21/2017 1635   GFRAA 112 02/21/2017 1635       Component Value Date/Time   WBC 6.6 02/21/2017 1635   RBC 4.36 02/21/2017 1635   HGB 12.9 02/21/2017 1635   HCT 38.2 02/21/2017 1635   PLT 327 02/21/2017 1635   MCV 87.6 02/21/2017 1635   MCH 29.6 02/21/2017 1635   MCHC 33.8 02/21/2017 1635   RDW 12.4 02/21/2017 1635   LYMPHSABS 1,228 02/21/2017 1635   MONOABS 402 12/30/2015  1507   EOSABS 132 02/21/2017 1635   BASOSABS 73 02/21/2017 1635    No results found for: POCLITH, LITHIUM   No results found for: PHENYTOIN, PHENOBARB, VALPROATE, CBMZ   .res Assessment: Plan:    Major depressive disorder, recurrent episode, moderate (HCC) - Plan: ARIPiprazole (ABILIFY) 5 MG tablet, DULoxetine (CYMBALTA) 30 MG capsule, DULoxetine (CYMBALTA) 60 MG capsule  Attention deficit hyperactivity disorder (ADHD), predominantly inattentive type  Generalized anxiety disorder - Plan: DULoxetine (CYMBALTA) 30 MG capsule, DULoxetine (CYMBALTA) 60 MG capsule  Hypersomnolence disorder, persistent  Social anxiety disorder - Plan: DULoxetine (CYMBALTA) 30 MG capsule, DULoxetine (CYMBALTA) 60 MG capsule  Grief   Less depressed with the Abilify clear benefit for depression and anxiety.   It's helped productivity and mood and energy and less hypersomnolence. Further improvement with increase Abilify to 5 mg daily.  Disc glucose and A1c again.Marland Kitchen    Option check labs and Hgb A1C bc in past was prediabetic. She's using buffered berberine.  She'd like to hold off on metformin.  We discussed the short-term risks associated with  benzodiazepines including sedation and increased fall risk among others.  Discussed long-term side effect risk including dependence, potential withdrawal symptoms, and the potential eventual dose-related risk of dementia.  But recent studies from 2020 dispute this association between benzodiazepines and dementia risk. Newer studies in 2020 do not support an association with dementia. She was successful at reducing Xanax for sleep.  Discussed potential metabolic side effects associated with atypical antipsychotics, as well as potential risk for movement side effects. Advised pt to contact office if movement side effects occur.   Later could consider reducing duloxetine for cost reasons. Continue duloxetine 90.Marland Kitchen  Disc risk sweating  And heat intolerance with  it.  Answered questions about tramadol with asthma cough. SE.   Continue modafinil 150 mg for hypersomnolence and ADD.  She doesn't tolerate traditional stimulants like Adderall and MPH..She has gotten benefit so far with it.  Disc pros and cons of increasing to 200 mg daily.  Defer. Protect sleep and try to get more if possible.  Stressed still dealing with hypochondriac mother with constant somatization and complaining and anxiety and frequently wanting to go to the ER.  Disc management with this. Stress of dealing with the lying and manipulation of her mother.  Disc this in therapy. And Family problems.  This is a chronic problem.  Remains very stressed with mother.  "Driving me out of my mind!"  Disc this in detail and how to handle her manipulation and hypochondriasis.  Having to help her a lot.  Disc limit setting and boundaries.  Disc regular scheduling PCP visits.    Disc questions she raised over a relationship possibility and how to adjust.  Doesn't feel like it's progressing.  Doesn't do dating websites.    Supportive therapy dealing with grief over the loss of the dog [place].  42 min appt  FU 3-4 mos  Lauren Parents, MD, DFAPA   Please see After Visit Summary for patient specific instructions.  Future Appointments  Date Time Provider [place]  11/27/2019  3:00 PM Ofilia Neas, PA-C [facility] None    No orders of the defined types were placed in this encounter.     -------------------------------

## 2019-09-13 DIAGNOSIS — J4520 Mild intermittent asthma, uncomplicated: Secondary | ICD-10-CM | POA: Diagnosis not present

## 2019-09-13 DIAGNOSIS — I10 Essential (primary) hypertension: Secondary | ICD-10-CM | POA: Diagnosis not present

## 2019-09-13 DIAGNOSIS — F322 Major depressive disorder, single episode, severe without psychotic features: Secondary | ICD-10-CM | POA: Diagnosis not present

## 2019-09-13 DIAGNOSIS — M152 Bouchard's nodes (with arthropathy): Secondary | ICD-10-CM | POA: Diagnosis not present

## 2019-09-13 DIAGNOSIS — M159 Polyosteoarthritis, unspecified: Secondary | ICD-10-CM | POA: Diagnosis not present

## 2019-09-13 DIAGNOSIS — D509 Iron deficiency anemia, unspecified: Secondary | ICD-10-CM | POA: Diagnosis not present

## 2019-09-13 DIAGNOSIS — M8580 Other specified disorders of bone density and structure, unspecified site: Secondary | ICD-10-CM | POA: Diagnosis not present

## 2019-09-13 DIAGNOSIS — E039 Hypothyroidism, unspecified: Secondary | ICD-10-CM | POA: Diagnosis not present

## 2019-09-18 DIAGNOSIS — J3089 Other allergic rhinitis: Secondary | ICD-10-CM | POA: Diagnosis not present

## 2019-09-18 DIAGNOSIS — J3081 Allergic rhinitis due to animal (cat) (dog) hair and dander: Secondary | ICD-10-CM | POA: Diagnosis not present

## 2019-09-18 DIAGNOSIS — J301 Allergic rhinitis due to pollen: Secondary | ICD-10-CM | POA: Diagnosis not present

## 2019-09-25 DIAGNOSIS — J3081 Allergic rhinitis due to animal (cat) (dog) hair and dander: Secondary | ICD-10-CM | POA: Diagnosis not present

## 2019-09-25 DIAGNOSIS — J3089 Other allergic rhinitis: Secondary | ICD-10-CM | POA: Diagnosis not present

## 2019-09-25 DIAGNOSIS — H16212 Exposure keratoconjunctivitis, left eye: Secondary | ICD-10-CM | POA: Diagnosis not present

## 2019-09-25 DIAGNOSIS — J301 Allergic rhinitis due to pollen: Secondary | ICD-10-CM | POA: Diagnosis not present

## 2019-09-28 ENCOUNTER — Other Ambulatory Visit: Payer: Self-pay | Admitting: Psychiatry

## 2019-09-28 DIAGNOSIS — F331 Major depressive disorder, recurrent, moderate: Secondary | ICD-10-CM

## 2019-10-01 DIAGNOSIS — J301 Allergic rhinitis due to pollen: Secondary | ICD-10-CM | POA: Diagnosis not present

## 2019-10-01 DIAGNOSIS — J3081 Allergic rhinitis due to animal (cat) (dog) hair and dander: Secondary | ICD-10-CM | POA: Diagnosis not present

## 2019-10-01 DIAGNOSIS — J3089 Other allergic rhinitis: Secondary | ICD-10-CM | POA: Diagnosis not present

## 2019-10-01 NOTE — Telephone Encounter (Signed)
review 

## 2019-10-08 DIAGNOSIS — J3081 Allergic rhinitis due to animal (cat) (dog) hair and dander: Secondary | ICD-10-CM | POA: Diagnosis not present

## 2019-10-08 DIAGNOSIS — J301 Allergic rhinitis due to pollen: Secondary | ICD-10-CM | POA: Diagnosis not present

## 2019-10-08 DIAGNOSIS — J3089 Other allergic rhinitis: Secondary | ICD-10-CM | POA: Diagnosis not present

## 2019-10-15 DIAGNOSIS — J301 Allergic rhinitis due to pollen: Secondary | ICD-10-CM | POA: Diagnosis not present

## 2019-10-15 DIAGNOSIS — J3089 Other allergic rhinitis: Secondary | ICD-10-CM | POA: Diagnosis not present

## 2019-10-22 DIAGNOSIS — J3089 Other allergic rhinitis: Secondary | ICD-10-CM | POA: Diagnosis not present

## 2019-10-22 DIAGNOSIS — J3081 Allergic rhinitis due to animal (cat) (dog) hair and dander: Secondary | ICD-10-CM | POA: Diagnosis not present

## 2019-10-22 DIAGNOSIS — M533 Sacrococcygeal disorders, not elsewhere classified: Secondary | ICD-10-CM | POA: Diagnosis not present

## 2019-10-22 DIAGNOSIS — M545 Low back pain, unspecified: Secondary | ICD-10-CM | POA: Diagnosis not present

## 2019-10-25 DIAGNOSIS — J45991 Cough variant asthma: Secondary | ICD-10-CM | POA: Diagnosis not present

## 2019-10-25 DIAGNOSIS — J3081 Allergic rhinitis due to animal (cat) (dog) hair and dander: Secondary | ICD-10-CM | POA: Diagnosis not present

## 2019-10-25 DIAGNOSIS — Z23 Encounter for immunization: Secondary | ICD-10-CM | POA: Diagnosis not present

## 2019-10-25 DIAGNOSIS — J301 Allergic rhinitis due to pollen: Secondary | ICD-10-CM | POA: Diagnosis not present

## 2019-10-29 DIAGNOSIS — J3089 Other allergic rhinitis: Secondary | ICD-10-CM | POA: Diagnosis not present

## 2019-10-29 DIAGNOSIS — J301 Allergic rhinitis due to pollen: Secondary | ICD-10-CM | POA: Diagnosis not present

## 2019-11-05 ENCOUNTER — Other Ambulatory Visit: Payer: Self-pay | Admitting: Psychiatry

## 2019-11-05 DIAGNOSIS — J301 Allergic rhinitis due to pollen: Secondary | ICD-10-CM | POA: Diagnosis not present

## 2019-11-05 DIAGNOSIS — F900 Attention-deficit hyperactivity disorder, predominantly inattentive type: Secondary | ICD-10-CM

## 2019-11-05 DIAGNOSIS — J3089 Other allergic rhinitis: Secondary | ICD-10-CM | POA: Diagnosis not present

## 2019-11-05 DIAGNOSIS — G4710 Hypersomnia, unspecified: Secondary | ICD-10-CM

## 2019-11-05 DIAGNOSIS — J3081 Allergic rhinitis due to animal (cat) (dog) hair and dander: Secondary | ICD-10-CM | POA: Diagnosis not present

## 2019-11-06 NOTE — Telephone Encounter (Signed)
Pharmacy sent & called for refill on Modafinil 100 mg. Next apt 11/10

## 2019-11-06 NOTE — Telephone Encounter (Signed)
Apt 11/10

## 2019-11-08 DIAGNOSIS — D509 Iron deficiency anemia, unspecified: Secondary | ICD-10-CM | POA: Diagnosis not present

## 2019-11-08 DIAGNOSIS — M159 Polyosteoarthritis, unspecified: Secondary | ICD-10-CM | POA: Diagnosis not present

## 2019-11-08 DIAGNOSIS — J4520 Mild intermittent asthma, uncomplicated: Secondary | ICD-10-CM | POA: Diagnosis not present

## 2019-11-08 DIAGNOSIS — M152 Bouchard's nodes (with arthropathy): Secondary | ICD-10-CM | POA: Diagnosis not present

## 2019-11-08 DIAGNOSIS — E039 Hypothyroidism, unspecified: Secondary | ICD-10-CM | POA: Diagnosis not present

## 2019-11-08 DIAGNOSIS — F322 Major depressive disorder, single episode, severe without psychotic features: Secondary | ICD-10-CM | POA: Diagnosis not present

## 2019-11-08 DIAGNOSIS — I10 Essential (primary) hypertension: Secondary | ICD-10-CM | POA: Diagnosis not present

## 2019-11-08 DIAGNOSIS — M8580 Other specified disorders of bone density and structure, unspecified site: Secondary | ICD-10-CM | POA: Diagnosis not present

## 2019-11-13 NOTE — Progress Notes (Deleted)
Office Visit Note  Patient: Lauren Bradley             Date of Birth: 05-17-1950           MRN: [id number]             PCP: Lavone Orn, MD Referring: Lavone Orn, MD Visit Date: 11/27/2019 Occupation: @GUAROCC @  Subjective:  No chief complaint on file.   History of Present Illness: Lauren Bradley is a 69 y.o. female ***   Activities of Daily Living:  Patient reports morning stiffness for *** {minute/hour:19697}.   Patient {ACTIONS;DENIES/REPORTS:21021675::"Denies"} nocturnal pain.  Difficulty dressing/grooming: {ACTIONS;DENIES/REPORTS:21021675::"Denies"} Difficulty climbing stairs: {ACTIONS;DENIES/REPORTS:21021675::"Denies"} Difficulty getting out of chair: {ACTIONS;DENIES/REPORTS:21021675::"Denies"} Difficulty using hands for taps, buttons, cutlery, and/or writing: {ACTIONS;DENIES/REPORTS:21021675::"Denies"}  No Rheumatology ROS completed.   PMFS History:  Patient Active Problem List   Diagnosis Date Noted  . Major depressive disorder, single episode, severe ([employer]) 10/06/2017  . History of vitamin D deficiency 10/12/2016  . History of seasonal allergies 12/29/2015  . Fibromyalgia 12/29/2015  . Other fatigue 12/29/2015  . Primary osteoarthritis of both hands 12/29/2015  . Primary osteoarthritis of both feet 12/29/2015  . DJD (degenerative joint disease), cervical 12/29/2015  . DDD (degenerative disc disease), lumbar 12/29/2015  . Osteopenia of multiple sites 12/29/2015  . History of migraine 12/29/2015    Past Medical History:  Diagnosis Date  . Asthma   . DDD (degenerative disc disease), cervical   . DDD (degenerative disc disease), lumbar   . Fatigue   . Fibromyalgia   . Infertility, female   . Osteoarthritis     Family History  Problem Relation Age of Onset  . Fibromyalgia Mother   . Kidney failure Mother   . Mental illness Mother   . Heart disease Father   . COPD Father   . Cancer Father        colon, liver   . Hypothyroidism Son    Past  Surgical History:  Procedure Laterality Date  . CESAREAN SECTION    . FOOT SURGERY    . GALLBLADDER SURGERY    . KNEE SURGERY     Social History   Social History Narrative  . Not on file    There is no immunization history on file for this patient.   Objective: Vital Signs: There were no vitals taken for this visit.   Physical Exam   Musculoskeletal Exam: ***  CDAI Exam: CDAI Score: -- Patient Global: --; Provider Global: -- Swollen: --; Tender: -- Joint Exam 11/27/2019   No joint exam has been documented for this visit   There is currently no information documented on the homunculus. Go to the Rheumatology activity and complete the homunculus joint exam.  Investigation: No additional findings.  Imaging: No results found.  Recent Labs: Lab Results  Component Value Date   WBC 6.6 02/21/2017   HGB 12.9 02/21/2017   PLT 327 02/21/2017   NA 137 02/21/2017   K 4.3 02/21/2017   CL 102 02/21/2017   CO2 29 02/21/2017   GLUCOSE 88 02/21/2017   BUN 16 02/21/2017   CREATININE 0.58 02/21/2017   BILITOT 0.3 02/21/2017   ALKPHOS 28 (L) 12/30/2015   AST 22 02/21/2017   ALT 20 02/21/2017   PROT 6.8 02/21/2017   ALBUMIN 4.4 12/30/2015   CALCIUM 9.4 02/21/2017   GFRAA 112 02/21/2017    Speciality Comments: No specialty comments available.  Procedures:  No procedures performed Allergies: Hydrocodone, Codeine, Other, Sulfa antibiotics, Cephalosporins, and Sulfamethoxazole  Assessment / Plan:     Visit Diagnoses: Primary osteoarthritis of both hands  Primary osteoarthritis of both feet  DDD (degenerative disc disease), cervical  DDD (degenerative disc disease), lumbar  Fibromyalgia  Other fatigue  Osteopenia of multiple sites  History of vitamin D deficiency  History of rotator cuff tear  History of migraine  Major depressive disorder, single episode, severe ([place])  Orders: No orders of the defined types were placed in this encounter.  No orders  of the defined types were placed in this encounter.   Face-to-face time spent with patient was *** minutes. Greater than 50% of time was spent in counseling and coordination of care.  Follow-Up Instructions: No follow-ups on file.   Ofilia Neas, PA-C  Note - This record has been created using Dragon software.  Chart creation errors have been sought, but may not always  have been located. Such creation errors do not reflect on  the standard of medical care.

## 2019-11-14 DIAGNOSIS — Z681 Body mass index (BMI) 19 or less, adult: Secondary | ICD-10-CM | POA: Diagnosis not present

## 2019-11-14 DIAGNOSIS — Z1231 Encounter for screening mammogram for malignant neoplasm of breast: Secondary | ICD-10-CM | POA: Diagnosis not present

## 2019-11-14 DIAGNOSIS — Z79890 Hormone replacement therapy (postmenopausal): Secondary | ICD-10-CM | POA: Diagnosis not present

## 2019-11-14 DIAGNOSIS — M8588 Other specified disorders of bone density and structure, other site: Secondary | ICD-10-CM | POA: Diagnosis not present

## 2019-11-14 DIAGNOSIS — Z779 Other contact with and (suspected) exposures hazardous to health: Secondary | ICD-10-CM | POA: Diagnosis not present

## 2019-11-14 DIAGNOSIS — N958 Other specified menopausal and perimenopausal disorders: Secondary | ICD-10-CM | POA: Diagnosis not present

## 2019-11-15 DIAGNOSIS — L821 Other seborrheic keratosis: Secondary | ICD-10-CM | POA: Diagnosis not present

## 2019-11-15 DIAGNOSIS — D239 Other benign neoplasm of skin, unspecified: Secondary | ICD-10-CM | POA: Diagnosis not present

## 2019-11-15 DIAGNOSIS — L309 Dermatitis, unspecified: Secondary | ICD-10-CM | POA: Diagnosis not present

## 2019-11-15 DIAGNOSIS — L570 Actinic keratosis: Secondary | ICD-10-CM | POA: Diagnosis not present

## 2019-11-20 DIAGNOSIS — G43719 Chronic migraine without aura, intractable, without status migrainosus: Secondary | ICD-10-CM | POA: Diagnosis not present

## 2019-11-21 ENCOUNTER — Ambulatory Visit (INDEPENDENT_AMBULATORY_CARE_PROVIDER_SITE_OTHER): Payer: Medicare Other | Admitting: Psychiatry

## 2019-11-21 ENCOUNTER — Other Ambulatory Visit: Payer: Self-pay

## 2019-11-21 ENCOUNTER — Encounter: Payer: Self-pay | Admitting: Psychiatry

## 2019-11-21 DIAGNOSIS — F900 Attention-deficit hyperactivity disorder, predominantly inattentive type: Secondary | ICD-10-CM

## 2019-11-21 DIAGNOSIS — G4710 Hypersomnia, unspecified: Secondary | ICD-10-CM | POA: Diagnosis not present

## 2019-11-21 DIAGNOSIS — J301 Allergic rhinitis due to pollen: Secondary | ICD-10-CM | POA: Diagnosis not present

## 2019-11-21 DIAGNOSIS — F411 Generalized anxiety disorder: Secondary | ICD-10-CM | POA: Diagnosis not present

## 2019-11-21 DIAGNOSIS — J3081 Allergic rhinitis due to animal (cat) (dog) hair and dander: Secondary | ICD-10-CM | POA: Diagnosis not present

## 2019-11-21 DIAGNOSIS — F4010 Social phobia, unspecified: Secondary | ICD-10-CM

## 2019-11-21 DIAGNOSIS — J3089 Other allergic rhinitis: Secondary | ICD-10-CM | POA: Diagnosis not present

## 2019-11-21 DIAGNOSIS — F331 Major depressive disorder, recurrent, moderate: Secondary | ICD-10-CM

## 2019-11-21 NOTE — Progress Notes (Signed)
Lauren Bradley [id number] 1950-03-19 69 y.o.  I connected with  [name] on 11/23/19 by a video enabled telemedicine application and verified that I am speaking with the correct person using two identifiers.   I discussed the limitations of evaluation and management by telemedicine. The patient expressed understanding and agreed to proceed.    Subjective:   Patient ID:  Lauren Bradley is a 69 y.o. (DOB [birth year]) female.  Chief Complaint:  Chief Complaint  Patient presents with  . Follow-up    med eval  . Anxiety  . Depression    Depression        Associated symptoms include myalgias and headaches.  Associated symptoms include no decreased concentration and no suicidal ideas.  [name] presents  today for follow-up of depression, anxiety and insomnia and chronic pain.   At visit Jun 08, 2018.  No major meds were changed except she was trying to gradually increase the Abilify to 2 mg daily for optimal mood effect.  I feel much better on the Abilify.  Makes my brain feel more normal and focus is better and calmer overall.   visit August 31, 2018.  She had asked to increase Abilify from 2 to 3 mg daily.  She had seen Abilify benefit at 2 mg but wished for additional benefit for mood and anxiety.  Last visit November 2020.  The following was noted: Has done well on Abilify 3 mg with additional improvement in mood, but still has a lot of anxiety.  [name] noticed the difference in her mood.  Managed the weight and lost to baseline.  Wonders about the increase to 5 mg to help anxiety.  Benefit for energy which is reasonable with less napping than before Abilify.  Took half Provigil last week when driving distance and did ok with it.   Helped ADHD.  Function is better.  Still some days flounder.  Not as good as paperwork years ago but better with Abilify.  Helped having [name] at home.  Was poor function before Abilify despite trying hard. Abilify helped the depression  clearly and very beneficial.  Not always sad now.  HA resolved.  Initially it caused great energy like a motor running and reduced need for sleep to 5-6 hours.  That has resolved.  SE weight gain without increasing calories.  Changed her sleep schedule for the better.  To bed earlier and up earlier.    I didn't realize how depressed I was.  Better organization and function and productivity.  Abilify helped regulate and correct her sleep wake schedule.  Not oversleeping now. Anxiety remains high chronically.  Can talk too much when anxious and her accountant commented on it to her.  QT has been good for her.  Chronic social anxiety.  Can be crippled from anxiety.   Plan: Trial increase Abilify 5 mg daily in hopes of further reducing anxiety and depressive symptoms.  05/31/2019 appointment the following is noted: Sleep well with naltrexone and alprazolam 0.25 mg HS. Better energy and alertness with modafinil 100 but a little less effect over time.  Some degree of tolerance. Hard to get paperwork done. Done well with aripiprazole 5 mg daily. Helped energy and coping with calmer manner. So much less depressed with Abilify.  Concerns over Hgb A1C which had been borderline in the past and is up a little to 5.9 now.  No extra weight at this point.  Is exercising.     Stressed still dealing  with hypochondriac mother with constant somatization and complaining and anxiety and frequently wanting to go to the ER.  Better handling it with Abilify.  Disc management with this. Mother compulsively and annoyingly call her repeatedly.  Feels shame over the feelings she has about her mother.  Major boundary issues from her mother.  Mother was neglectful in her childhood.  Recognizes some blacked out memories from her childhood.  Asked how to deal with this with mother.  09/11/19 appt with the following noted: Not quite as well with stress from mother bc B doesn't help much. Concerned about Abilify poss effecting glucose.   Did have high AIC in past even before Abilify.  Has to watch carbs.  Plans to see PCP Dr. [name] again about it. Poor heat tolerance this year.  Wonders if related to modafinil.  Went up to 150 mg and didn't notice a lot of issues.   No SE otherwise.  Is able to do some paperwork more effectively lately.  More pain intolerant as well. No problems with sleep affected by modafinil.  2 dogs sleep with her.  Cough can wake her worse off tramadol.   Lost her dog to pancreatic CA.  Good help with homeopathic vet.  Feels guilty he had fever and she didn't realize it at the time. Plan: no med changes.  Continue Abilfy 5 which helped and increased modafinil to 150. And other meds.  11/21/2019 appointment with the following noted: Stress care of mother and needs help.  Wonders about moving mother in to help financially.  House needs work. Does everything for mother now.    B's son shot himself at her mother's home. Doesn't feel B respects mother and it causes problems. Realized big issues wiith [employer].  [name]'s graduation brought up some of this she thought she had addressed.    Some social anxiety still significant.  Past Psychiatric Medication Trials: Abilify 3 mg helpful, duloxetine 90, venlafaxine irritable, buspirone side effects, try cyclic antidepressants, Wellbutrin, Zoloft, Paxil, Modafinil 150, Vyvanse 30, Provigil, Ritalin, Adderall AG,  propranolol Belsomra, Lunesta, Ambien, trazodone, alprazolam, doxepin, mirtazapine,  Review of Systems:  Review of Systems  Respiratory: Positive for cough and shortness of breath.   Cardiovascular: Negative for chest pain and palpitations.  Musculoskeletal: Positive for arthralgias, back pain and myalgias.  Neurological: Positive for headaches. Negative for dizziness, tremors and weakness.  Psychiatric/Behavioral: Positive for depression. Negative for agitation, behavioral problems, confusion, decreased concentration, dysphoric mood, hallucinations,  self-injury, sleep disturbance and suicidal ideas. The patient is nervous/anxious. The patient is not hyperactive.     Medications: I have reviewed the patient's current medications.  Current Outpatient Medications  Medication Sig Dispense Refill  . ALPRAZolam (XANAX) 0.5 MG tablet TAKE 2 TABLETS BY MOUTH EVERY NIGHT AT BEDTIME AND 1 TABLET EVERY DAY AS NEEDED (Patient taking differently: Take 0.25 mg by mouth at bedtime. ) 75 tablet 0  . ARIPiprazole (ABILIFY) 5 MG tablet TAKE ONE TABLET BY MOUTH ONCE DAILY 90 tablet 0  . azelastine (ASTELIN) 0.1 % nasal spray     . calcium carbonate (CALCIUM 600) 600 MG TABS tablet Take 600 mg by mouth.    . DULoxetine (CYMBALTA) 30 MG capsule Take 1 capsule (30 mg) by mouth daily with 1 capsules (60 mg) 90 capsule 1  . DULoxetine (CYMBALTA) 60 MG capsule TAKE ONE CAPSULE BY MOUTH DAILY WITH 30 MG CAPSULE OF DULOXETINE 90 capsule 1  . EPINEPHrine (EPIPEN JR) 0.15 MG/0.3ML injection     . estradiol (  VIVELLE-DOT) 0.075 MG/24HR estradiol 0.075 mg/24 hr semiweekly transdermal patch  APPLY 1 PATCH TO SKIN TWICE WEEKLY AS DIRECTED    . Levomefolate Glucosamine (METHYLFOLATE PO) Take by mouth.    Marland Kitchen MAGNESIUM MALATE PO Take by mouth daily.    . modafinil (PROVIGIL) 100 MG tablet TAKE 1 AND 1/2 TABLETS BY MOUTH ONCE DAILY 45 tablet 3  . NP THYROID 15 MG tablet     . progesterone (PROMETRIUM) 200 MG capsule Take by mouth.    . sodium fluoride (FLUORISHIELD) 1.1 % GEL dental gel SF 5000 Plus 1.1 % dental cream    . SUMAtriptan (IMITREX) 100 MG tablet TAKE 1 TABLET BY MOUTH AT ONSET OF HEADACHE. MAY REPEAT IN 2 HOURS. MAX 2 TABLETS PER DAY    . thyroid (NP THYROID) 90 MG tablet TAKE 1 TABLET BY MOUTH EVERY DAY ON AN EMPTY STOMACH WITH 15 MG DOSE    . tretinoin (RETIN-A) 0.025 % cream APPLY A PEA SIZED AMOUNT TO FACE IN THE EVENING    . Vitamin D, Ergocalciferol, (DRISDOL) 50000 units CAPS capsule Take 50,000 Units by mouth every 7 (seven) days.      No current  facility-administered medications for this visit.    Medication Side Effects: HA stopped with Abilify, weight gain, hungry is odd  Allergies:  Allergies  Allergen Reactions  . Hydrocodone Itching  . Codeine Nausea And Vomiting  . Other   . Sulfa Antibiotics   . Cephalosporins Rash  . Sulfamethoxazole Rash    Unknown reaction    Past Medical History:  Diagnosis Date  . Asthma   . DDD (degenerative disc disease), cervical   . DDD (degenerative disc disease), lumbar   . Fatigue   . Fibromyalgia   . Infertility, female   . Osteoarthritis     Family History  Problem Relation Age of Onset  . Fibromyalgia Mother   . Kidney failure Mother   . Mental illness Mother   . Heart disease Father   . COPD Father   . Cancer Father        colon, liver   . Hypothyroidism Son     Social History   Socioeconomic History  . Marital status: Single    Spouse name: Not on file  . Number of children: Not on file  . Years of education: Not on file  . Highest education level: Not on file  Occupational History  . Not on file  Tobacco Use  . Smoking status: Never Smoker  . Smokeless tobacco: Never Used  Vaping Use  . Vaping Use: Never used  Substance and Sexual Activity  . Alcohol use: Yes    Comment: "Very little"  . Drug use: No  . Sexual activity: Not on file  Other Topics Concern  . Not on file  Social History Narrative  . Not on file   Social Determinants of Health   Financial Resource Strain:   . Difficulty of Paying Living Expenses: Not on file  Food Insecurity:   . Worried About Charity fundraiser in the Last Year: Not on file  . Ran Out of Food in the Last Year: Not on file  Transportation Needs:   . Lack of Transportation (Medical): Not on file  . Lack of Transportation (Non-Medical): Not on file  Physical Activity:   . Days of Exercise per Week: Not on file  . Minutes of Exercise per Session: Not on file  Stress:   . Feeling of Stress : Not  on file  Social  Connections:   . Frequency of Communication with Friends and Family: Not on file  . Frequency of Social Gatherings with Friends and Family: Not on file  . Attends Religious Services: Not on file  . Active Member of Clubs or Organizations: Not on file  . Attends Archivist Meetings: Not on file  . Marital Status: Not on file  Intimate Partner Violence:   . Fear of Current or Ex-Partner: Not on file  . Emotionally Abused: Not on file  . Physically Abused: Not on file  . Sexually Abused: Not on file    Past Medical History, Surgical history, Social history, and Family history were reviewed and updated as appropriate.   Please see review of systems for further details on the patient's review from today.   Objective:   Physical Exam:  There were no vitals taken for this visit.  Physical Exam Constitutional:      General: She is not in acute distress. Musculoskeletal:        General: No deformity.  Neurological:     Mental Status: She is alert and oriented to person, place, and time.     Cranial Nerves: No dysarthria.     Coordination: Coordination normal.  Psychiatric:        Attention and Perception: Attention and perception normal. She does not perceive auditory or visual hallucinations.        Mood and Affect: Mood is anxious. Mood is not depressed. Affect is not labile, blunt, angry, tearful or inappropriate.        Speech: Speech normal. Speech is not slurred.        Behavior: Behavior normal. Behavior is cooperative.        Thought Content: Thought content normal. Thought content is not paranoid or delusional. Thought content does not include homicidal or suicidal ideation. Thought content does not include homicidal or suicidal plan.        Cognition and Memory: Cognition and memory normal.        Judgment: Judgment normal.     Comments: Insight fair to good.  Depression has almost resolved with the Abilify for the most part.  Anxiety remains high.     Lab  Review:     Component Value Date/Time   NA 137 02/21/2017 1635   K 4.3 02/21/2017 1635   CL 102 02/21/2017 1635   CO2 29 02/21/2017 1635   GLUCOSE 88 02/21/2017 1635   BUN 16 02/21/2017 1635   CREATININE 0.58 02/21/2017 1635   CALCIUM 9.4 02/21/2017 1635   PROT 6.8 02/21/2017 1635   ALBUMIN 4.4 12/30/2015 1507   AST 22 02/21/2017 1635   ALT 20 02/21/2017 1635   ALKPHOS 28 (L) 12/30/2015 1507   BILITOT 0.3 02/21/2017 1635   GFRNONAA 97 02/21/2017 1635   GFRAA 112 02/21/2017 1635       Component Value Date/Time   WBC 6.6 02/21/2017 1635   RBC 4.36 02/21/2017 1635   HGB 12.9 02/21/2017 1635   HCT 38.2 02/21/2017 1635   PLT 327 02/21/2017 1635   MCV 87.6 02/21/2017 1635   MCH 29.6 02/21/2017 1635   MCHC 33.8 02/21/2017 1635   RDW 12.4 02/21/2017 1635   LYMPHSABS 1,228 02/21/2017 1635   MONOABS 402 12/30/2015 1507   EOSABS 132 02/21/2017 1635   BASOSABS 73 02/21/2017 1635    No results found for: POCLITH, LITHIUM   No results found for: PHENYTOIN, PHENOBARB, VALPROATE, CBMZ   .res Assessment: Plan:  Major depressive disorder, recurrent episode, moderate (HCC)  Attention deficit hyperactivity disorder (ADHD), predominantly inattentive type  Generalized anxiety disorder  Hypersomnolence disorder, persistent  Social anxiety disorder   Less depressed with the Abilify clear benefit for depression and anxiety.   It's helped productivity and mood and energy and less hypersomnolence. Further improvement with increase Abilify to 5 mg daily.  Disc glucose and A1c again.Marland Kitchen    Option check labs and Hgb A1C bc in past was prediabetic. She's using buffered berberine.  She'd like to hold off on metformin.  We discussed the short-term risks associated with benzodiazepines including sedation and increased fall risk among others.  Discussed long-term side effect risk including dependence, potential withdrawal symptoms, and the potential eventual dose-related risk of dementia.   But recent studies from 2020 dispute this association between benzodiazepines and dementia risk. Newer studies in 2020 do not support an association with dementia. She was successful at reducing Xanax for sleep.  Discussed potential metabolic side effects associated with atypical antipsychotics, as well as potential risk for movement side effects. Advised pt to contact office if movement side effects occur.   Later could consider reducing duloxetine for cost reasons. Continue duloxetine 90.Marland Kitchen  Disc risk sweating  And heat intolerance with it.  Answered questions about tramadol with asthma cough. SE.   Continue modafinil 150 mg for hypersomnolence and ADD.  She doesn't tolerate traditional stimulants like Adderall and MPH..She has gotten benefit so far with it.  Disc pros and cons of increasing to 200 mg daily.  Defer. Protect sleep and try to get more if possible.   No med changes today.  Stressed still dealing with hypochondriac mother with constant somatization and complaining and anxiety and frequently wanting to go to the ER.  Disc management with this. Disc potential plan to have mother move in to her home.  Some advantages but potentiallly very stressful.  Disc way to have a trial run with this to see if it's manageable from mental health perspective.  M intrusive and demanding.   Stress of dealing with the lying and manipulation of her mother.  Disc this in therapy. And Family problems.  This is a chronic problem.  Remains very stressed with mother.  "Driving me out of my mind!"  Disc this in detail and how to handle her manipulation and hypochondriasis.  Having to help her a lot.  Disc limit setting and boundaries.  Disc regular scheduling PCP visits.    45 min appt  FU 3-4 mos  Lynder Parents, MD, DFAPA   Please see After Visit Summary for patient specific instructions.  Future Appointments  Date Time Provider [place]  12/24/2019  1:00 PM Ofilia Neas, PA-C [facility] None   01/22/2020  3:00 PM Cottle, Billey Co., MD [facility] None    No orders of the defined types were placed in this encounter.     -------------------------------

## 2019-11-27 ENCOUNTER — Ambulatory Visit: Payer: Medicare Other | Admitting: Physician Assistant

## 2019-11-27 DIAGNOSIS — Z8669 Personal history of other diseases of the nervous system and sense organs: Secondary | ICD-10-CM

## 2019-11-27 DIAGNOSIS — R5383 Other fatigue: Secondary | ICD-10-CM

## 2019-11-27 DIAGNOSIS — Z8639 Personal history of other endocrine, nutritional and metabolic disease: Secondary | ICD-10-CM

## 2019-11-27 DIAGNOSIS — F322 Major depressive disorder, single episode, severe without psychotic features: Secondary | ICD-10-CM

## 2019-11-27 DIAGNOSIS — M19042 Primary osteoarthritis, left hand: Secondary | ICD-10-CM

## 2019-11-27 DIAGNOSIS — M8589 Other specified disorders of bone density and structure, multiple sites: Secondary | ICD-10-CM

## 2019-11-27 DIAGNOSIS — M797 Fibromyalgia: Secondary | ICD-10-CM

## 2019-11-27 DIAGNOSIS — M5030 Other cervical disc degeneration, unspecified cervical region: Secondary | ICD-10-CM

## 2019-11-27 DIAGNOSIS — M5136 Other intervertebral disc degeneration, lumbar region: Secondary | ICD-10-CM

## 2019-11-27 DIAGNOSIS — M19071 Primary osteoarthritis, right ankle and foot: Secondary | ICD-10-CM

## 2019-11-27 DIAGNOSIS — Z8739 Personal history of other diseases of the musculoskeletal system and connective tissue: Secondary | ICD-10-CM

## 2019-12-04 ENCOUNTER — Other Ambulatory Visit: Payer: Self-pay | Admitting: Psychiatry

## 2019-12-04 DIAGNOSIS — F411 Generalized anxiety disorder: Secondary | ICD-10-CM

## 2019-12-04 DIAGNOSIS — F4010 Social phobia, unspecified: Secondary | ICD-10-CM

## 2019-12-04 DIAGNOSIS — J3089 Other allergic rhinitis: Secondary | ICD-10-CM | POA: Diagnosis not present

## 2019-12-04 DIAGNOSIS — J3081 Allergic rhinitis due to animal (cat) (dog) hair and dander: Secondary | ICD-10-CM | POA: Diagnosis not present

## 2019-12-04 DIAGNOSIS — F331 Major depressive disorder, recurrent, moderate: Secondary | ICD-10-CM

## 2019-12-04 DIAGNOSIS — J301 Allergic rhinitis due to pollen: Secondary | ICD-10-CM | POA: Diagnosis not present

## 2019-12-10 DIAGNOSIS — M7741 Metatarsalgia, right foot: Secondary | ICD-10-CM | POA: Diagnosis not present

## 2019-12-10 DIAGNOSIS — M7752 Other enthesopathy of left foot: Secondary | ICD-10-CM | POA: Diagnosis not present

## 2019-12-10 DIAGNOSIS — M792 Neuralgia and neuritis, unspecified: Secondary | ICD-10-CM | POA: Diagnosis not present

## 2019-12-10 DIAGNOSIS — T1490XA Injury, unspecified, initial encounter: Secondary | ICD-10-CM | POA: Diagnosis not present

## 2019-12-10 DIAGNOSIS — S99912A Unspecified injury of left ankle, initial encounter: Secondary | ICD-10-CM | POA: Diagnosis not present

## 2019-12-10 NOTE — Progress Notes (Signed)
Office Visit Note  Patient: Lauren Bradley             Date of Birth: 08/30/1950           MRN: [id number]             PCP: Lavone Orn, MD Referring: Lavone Orn, MD Visit Date: 12/24/2019 Occupation: @GUAROCC @  Subjective:  Pain in both hands   History of Present Illness: Lauren Bradley is a 69 y.o. female with history of osteoarthritis, DDD, and fibromyalgia.  Patient reports that she continues to have persistent pain in multiple joints especially both hands.  She has bilateral CMC joint pain and uses CMC joint braces intermittently.  She has also been wearing a figure-of-eight ring on the left third PIP joint, which as alleviated some of her discomfort. She has tried to wear arthritis compression gloves in the past but had to take them off so frequently throughout the day that she did not notice much benefit. She continues to follow up with Dr. [name] on occasion.  She has been performing hand exercises daily.  Her hand pain is exacerbated by cooking and cleaning.  She reports she has been experiencing increased lower back pain, especially in both SI joints.  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  She continues to have frequent falls, which she attributes to gait instability from chronic foot and ankle pain.  She denies any recent fractures.  She continues to take calcium and vitamin D as recommended. She has been using her recumbent bike for 1 hour daily and performing resistive exercises.  She reports she has had a rash on the back of her neck for the past several years.  She is followed by Dr. Delman Cheadle at [facility]. According to the patient she had a biopsy in the past, which was consistent with inflammation.  She has tried using triamcinolone cream topically but noticed increased itching.  She was recently prescribed tacrolimus which she has been applying daily but has not noticed any  improvement yet.  She denies any facial rashes.      Activities of Daily Living:  Patient reports morning stiffness for 30-60 minutes.   Patient Denies nocturnal pain.  Difficulty dressing/grooming: Denies Difficulty climbing stairs: Denies Difficulty getting out of chair: Denies Difficulty using hands for taps, buttons, cutlery, and/or writing: Reports  Review of Systems  Constitutional: Negative for fatigue.  HENT: Negative for mouth sores, mouth dryness and nose dryness.   Eyes: Negative for pain, itching, visual disturbance and dryness.  Respiratory: Negative for cough, hemoptysis, shortness of breath and difficulty breathing.   Cardiovascular: Positive for irregular heartbeat. Negative for chest pain, palpitations and swelling in legs/feet.  Gastrointestinal: Positive for constipation and diarrhea. Negative for abdominal pain and blood in stool.  Endocrine: Positive for increased urination.  Genitourinary: Negative for painful urination.  Musculoskeletal: Positive for arthralgias, joint pain, joint swelling, myalgias, morning stiffness and myalgias. Negative for muscle weakness and muscle tenderness.  Skin: Positive for rash. Negative for color change and redness.  Allergic/Immunologic: Negative for susceptible to infections.  Neurological: Negative for dizziness, numbness, headaches, memory loss and weakness.  Hematological: Negative for swollen glands.  Psychiatric/Behavioral: Negative for confusion and sleep disturbance.    PMFS History:  Patient Active Problem List   Diagnosis Date Noted  . Major depressive disorder, single episode, severe ([place]) 10/06/2017  . History of vitamin D deficiency 10/12/2016  .  History of seasonal allergies 12/29/2015  . Fibromyalgia 12/29/2015  . Other fatigue 12/29/2015  . Primary osteoarthritis of both hands 12/29/2015  . Primary osteoarthritis of both feet 12/29/2015  . DJD (degenerative joint disease), cervical 12/29/2015  . DDD  (degenerative disc disease), lumbar 12/29/2015  . Osteopenia of multiple sites 12/29/2015  . History of migraine 12/29/2015    Past Medical History:  Diagnosis Date  . Asthma   . DDD (degenerative disc disease), cervical   . DDD (degenerative disc disease), lumbar   . Fatigue   . Fibromyalgia   . Infertility, female   . Osteoarthritis     Family History  Problem Relation Age of Onset  . Fibromyalgia Mother   . Kidney failure Mother   . Mental illness Mother   . Heart disease Father   . COPD Father   . Cancer Father        colon, liver   . Hypothyroidism Son    Past Surgical History:  Procedure Laterality Date  . CESAREAN SECTION    . FOOT SURGERY    . GALLBLADDER SURGERY    . KNEE SURGERY     Social History   Social History Narrative  . Not on file   Immunization History  Administered Date(s) Administered  . Moderna Sars-Covid-2 Vaccination 03/09/2019, 04/06/2019     Objective: Vital Signs: BP (!) 151/87 (BP Location: Left Arm, Patient Position: Sitting, Cuff Size: Small)   Pulse (!) 102   Ht 5\' 3"  (1.6 m)   Wt 101 lb 12.8 oz (46.2 kg)   BMI 18.03 kg/m    Physical Exam Vitals and nursing note reviewed.  Constitutional:      Appearance: She is well-developed and well-nourished.  HENT:     Head: Normocephalic and atraumatic.  Eyes:     Extraocular Movements: EOM normal.     Conjunctiva/sclera: Conjunctivae normal.  Cardiovascular:     Pulses: Intact distal pulses.  Pulmonary:     Effort: Pulmonary effort is normal.  Abdominal:     Palpations: Abdomen is soft.  Musculoskeletal:     Cervical back: Normal range of motion.  Skin:    General: Skin is warm and dry.     Capillary Refill: Capillary refill takes less than 2 seconds.     Comments: Papular rash on posterior aspect of neck.   Neurological:     Mental Status: She is alert and oriented to person, place, and time.  Psychiatric:        Mood and Affect: Mood and affect normal.        Behavior:  Behavior normal.      Musculoskeletal Exam: C-spine limited ROM with lateral rotation bilaterally.  Thoracic and lumbar spine good ROM.  SI joint tenderness bilaterally.  Shoulder joints, elbow joints, wrist joints, MCPs, PIPs, and DIPs good ROM with no synovitis.  PIP and DIP thickening consistent with osteoarthritis of both hands.  [place] joint prominence bilaterally.  Hip joints good ROM with no discomfort. Knee joints good ROM with no warmth or effusion.    CDAI Exam: CDAI Score: -- Patient Global: --; Provider Global: -- Swollen: --; Tender: -- Joint Exam 12/24/2019   No joint exam has been documented for this visit   There is currently no information documented on the homunculus. Go to the Rheumatology activity and complete the homunculus joint exam.  Investigation: No additional findings.  Imaging: No results found.  Recent Labs: Lab Results  Component Value Date   WBC 6.6 02/21/2017  HGB 12.9 02/21/2017   PLT 327 02/21/2017   NA 137 02/21/2017   K 4.3 02/21/2017   CL 102 02/21/2017   CO2 29 02/21/2017   GLUCOSE 88 02/21/2017   BUN 16 02/21/2017   CREATININE 0.58 02/21/2017   BILITOT 0.3 02/21/2017   ALKPHOS 28 (L) 12/30/2015   AST 22 02/21/2017   ALT 20 02/21/2017   PROT 6.8 02/21/2017   ALBUMIN 4.4 12/30/2015   CALCIUM 9.4 02/21/2017   GFRAA 112 02/21/2017    Speciality Comments: No specialty comments available.  Procedures:  No procedures performed Allergies: Hydrocodone, Codeine, Other, Sulfa antibiotics, Cephalosporins, and Sulfamethoxazole   Assessment / Plan:     Visit Diagnoses: Primary osteoarthritis of both hands: Chronic pain.  She has PIP and DIP thickening consistent with osteoarthritis of both hands.  CMC joint prominence noted bilaterally.  No synovitis was noted on exam.  She has persistent discomfort and stiffness in both hands and has occasional difficulty with ADLs.  Her hand pain is exacerbated by cooking and cleaning on a daily basis.  No  synovitis was noted on examination today.  She was encouraged to continue to use bilateral CMC joint braces as well as arthritis compression gloves at night.  We discussed the importance of joint protection and muscle strengthening.  She was encouraged to perform hand exercises on a daily basis. Discussed a referral to hand therapy but she declined at this time. She plans to continue to follow an antiinflammatory diet. She was advised to notify us if her hand pain persists or worsens.  She will follow-up in the office in 6 months.  Primary osteoarthritis of both feet: Chronic pain.  She wears proper fitting shoes with orotics and a left ankle joint brace.  She experiences some gait instability due to chronic pain in both feet.  She has been experiencing frequent falls recently.  We discussed the importance of lower extremity muscle strengthening and fall prevention.  She has started to use her recumbent bike on a daily basis for at least 1 hour.  She is also planning to start physical therapy for stretching and strengthening exercises.  DDD (degenerative disc disease), cervical: She has limited range of motion of the C-spine especially with lateral rotation.  She has no symptoms of radiculopathy at this time.  She was encouraged to perform stretching exercises on a daily basis.  DDD (degenerative disc disease), lumbar: Chronic pain.  She has been working with her son who is a Restaurant manager, fast food and has had several adjustments recently.  She plans on starting to go to physical therapy for stretching and strengthening exercises.  We discussed the importance of core strengthening.  Fibromyalgia: She experiences intermittent myalgias and muscle tenderness and underlying fibromyalgia.  Her level of fatigue has been stable recently.  We discussed the importance of regular exercise and good sleep hygiene.   Other fatigue: Her energy level has been stable recently.  She has been exercising on a daily basis by riding her  recumbent bike for at least 1 hour.  Osteopenia of multiple sites: According to the patient she had a recent bone density ordered by her PCP.  She continues to have frequent falls.  She has not had any recent fractures.  We discussed the importance of lower extremity muscle strengthening and fall prevention.  Discussed the importance of resistive exercises.  She has been taking vitamin D 50,000 units once weekly.  She is also taking a calcium and magnesium supplement daily.  History of vitamin  D deficiency: She is taking vitamin D 50,000 units once weekly.   History of rotator cuff tear: She did not undergo surgical repair by Dr. [name].  She went to physical therapy and continues to perform home exercises daily.    Papular rash, localized: Papular rash was noted on the posterior aspect of her neck.  She has had this rash for several years and has been evaluated at [facility] dermat[facility] by Dr. Delman Cheadle.  According to the patient she had a biopsy in the past which was consistent with" inflammation."  She was referred to asthma and allergy for patch testing in the past.  According to the patient she has a allergy to gold, so she has primarily been only wearing silver jewelry. She has tried using triamcinolone cream topically which caused increased skin itching.  She has been using tacrolimus once daily but has not noticed any clinical improvement.  She was advised to follow-up with her dermatologist closely for further evaluation and management.   History of migraine  Orders: No orders of the defined types were placed in this encounter.  No orders of the defined types were placed in this encounter.    Follow-Up Instructions: Return in about 6 months (around 06/23/2020) for Osteoarthritis, DDD.   Ofilia Neas, PA-C  Note - This record has been created using Dragon software.  Chart creation errors have been sought, but may not always  have been located. Such creation errors do not reflect on   the standard of medical care.

## 2019-12-24 ENCOUNTER — Other Ambulatory Visit: Payer: Self-pay

## 2019-12-24 ENCOUNTER — Ambulatory Visit (INDEPENDENT_AMBULATORY_CARE_PROVIDER_SITE_OTHER): Payer: Medicare Other | Admitting: Physician Assistant

## 2019-12-24 ENCOUNTER — Encounter: Payer: Self-pay | Admitting: Physician Assistant

## 2019-12-24 VITALS — BP 151/87 | HR 102 | Ht 63.0 in | Wt 101.8 lb

## 2019-12-24 DIAGNOSIS — M19041 Primary osteoarthritis, right hand: Secondary | ICD-10-CM | POA: Diagnosis not present

## 2019-12-24 DIAGNOSIS — M5136 Other intervertebral disc degeneration, lumbar region: Secondary | ICD-10-CM

## 2019-12-24 DIAGNOSIS — M8589 Other specified disorders of bone density and structure, multiple sites: Secondary | ICD-10-CM

## 2019-12-24 DIAGNOSIS — M19042 Primary osteoarthritis, left hand: Secondary | ICD-10-CM | POA: Diagnosis not present

## 2019-12-24 DIAGNOSIS — Z8669 Personal history of other diseases of the nervous system and sense organs: Secondary | ICD-10-CM

## 2019-12-24 DIAGNOSIS — M797 Fibromyalgia: Secondary | ICD-10-CM | POA: Diagnosis not present

## 2019-12-24 DIAGNOSIS — Z8739 Personal history of other diseases of the musculoskeletal system and connective tissue: Secondary | ICD-10-CM

## 2019-12-24 DIAGNOSIS — M5030 Other cervical disc degeneration, unspecified cervical region: Secondary | ICD-10-CM

## 2019-12-24 DIAGNOSIS — R21 Rash and other nonspecific skin eruption: Secondary | ICD-10-CM | POA: Diagnosis not present

## 2019-12-24 DIAGNOSIS — M19071 Primary osteoarthritis, right ankle and foot: Secondary | ICD-10-CM | POA: Diagnosis not present

## 2019-12-24 DIAGNOSIS — Z8639 Personal history of other endocrine, nutritional and metabolic disease: Secondary | ICD-10-CM

## 2019-12-24 DIAGNOSIS — R5383 Other fatigue: Secondary | ICD-10-CM | POA: Diagnosis not present

## 2019-12-24 DIAGNOSIS — M19072 Primary osteoarthritis, left ankle and foot: Secondary | ICD-10-CM

## 2019-12-26 DIAGNOSIS — N393 Stress incontinence (female) (male): Secondary | ICD-10-CM | POA: Diagnosis not present

## 2019-12-26 DIAGNOSIS — K5900 Constipation, unspecified: Secondary | ICD-10-CM | POA: Diagnosis not present

## 2019-12-28 DIAGNOSIS — J301 Allergic rhinitis due to pollen: Secondary | ICD-10-CM | POA: Diagnosis not present

## 2019-12-28 DIAGNOSIS — J3089 Other allergic rhinitis: Secondary | ICD-10-CM | POA: Diagnosis not present

## 2019-12-28 DIAGNOSIS — J3081 Allergic rhinitis due to animal (cat) (dog) hair and dander: Secondary | ICD-10-CM | POA: Diagnosis not present

## 2019-12-31 DIAGNOSIS — J3089 Other allergic rhinitis: Secondary | ICD-10-CM | POA: Diagnosis not present

## 2019-12-31 DIAGNOSIS — J301 Allergic rhinitis due to pollen: Secondary | ICD-10-CM | POA: Diagnosis not present

## 2019-12-31 DIAGNOSIS — D485 Neoplasm of uncertain behavior of skin: Secondary | ICD-10-CM | POA: Diagnosis not present

## 2019-12-31 DIAGNOSIS — L989 Disorder of the skin and subcutaneous tissue, unspecified: Secondary | ICD-10-CM | POA: Diagnosis not present

## 2019-12-31 DIAGNOSIS — J3081 Allergic rhinitis due to animal (cat) (dog) hair and dander: Secondary | ICD-10-CM | POA: Diagnosis not present

## 2020-01-02 DIAGNOSIS — K5900 Constipation, unspecified: Secondary | ICD-10-CM | POA: Diagnosis not present

## 2020-01-02 DIAGNOSIS — N393 Stress incontinence (female) (male): Secondary | ICD-10-CM | POA: Diagnosis not present

## 2020-01-09 DIAGNOSIS — J3089 Other allergic rhinitis: Secondary | ICD-10-CM | POA: Diagnosis not present

## 2020-01-09 DIAGNOSIS — J301 Allergic rhinitis due to pollen: Secondary | ICD-10-CM | POA: Diagnosis not present

## 2020-01-09 DIAGNOSIS — J3081 Allergic rhinitis due to animal (cat) (dog) hair and dander: Secondary | ICD-10-CM | POA: Diagnosis not present

## 2020-01-15 DIAGNOSIS — M5459 Other low back pain: Secondary | ICD-10-CM | POA: Diagnosis not present

## 2020-01-16 DIAGNOSIS — N393 Stress incontinence (female) (male): Secondary | ICD-10-CM | POA: Diagnosis not present

## 2020-01-16 DIAGNOSIS — N6001 Solitary cyst of right breast: Secondary | ICD-10-CM | POA: Diagnosis not present

## 2020-01-16 DIAGNOSIS — K5900 Constipation, unspecified: Secondary | ICD-10-CM | POA: Diagnosis not present

## 2020-01-17 DIAGNOSIS — J3081 Allergic rhinitis due to animal (cat) (dog) hair and dander: Secondary | ICD-10-CM | POA: Diagnosis not present

## 2020-01-17 DIAGNOSIS — M545 Low back pain, unspecified: Secondary | ICD-10-CM | POA: Diagnosis not present

## 2020-01-17 DIAGNOSIS — J301 Allergic rhinitis due to pollen: Secondary | ICD-10-CM | POA: Diagnosis not present

## 2020-01-17 DIAGNOSIS — J3089 Other allergic rhinitis: Secondary | ICD-10-CM | POA: Diagnosis not present

## 2020-01-18 ENCOUNTER — Telehealth: Payer: Self-pay

## 2020-01-18 DIAGNOSIS — R922 Inconclusive mammogram: Secondary | ICD-10-CM | POA: Diagnosis not present

## 2020-01-18 NOTE — Telephone Encounter (Signed)
Attempted to contact the patient.   Reviewed the patients message with Dr. Estanislado Pandy. PXE is not related to the patients underlying osteoarthritis. She recommends continuing to follow up with her dermatologist and PCP.

## 2020-01-18 NOTE — Telephone Encounter (Signed)
Patient advised Reviewed the patients message with Dr. Estanislado Pandy. PXE is not related to the patients underlying osteoarthritis. She recommends continuing to follow up with her dermatologist and PCP. Patient expressed understanding.

## 2020-01-18 NOTE — Telephone Encounter (Signed)
Patient called stating she had an appointment with Dr. 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  Patient is requesting a return call to discuss how this might affect her treatment with Dr. Estanislado Pandy.

## 2020-01-20 DIAGNOSIS — Z23 Encounter for immunization: Secondary | ICD-10-CM | POA: Diagnosis not present

## 2020-01-22 ENCOUNTER — Telehealth: Payer: Medicare Other | Admitting: Psychiatry

## 2020-01-24 DIAGNOSIS — M545 Low back pain, unspecified: Secondary | ICD-10-CM | POA: Diagnosis not present

## 2020-01-25 ENCOUNTER — Ambulatory Visit (INDEPENDENT_AMBULATORY_CARE_PROVIDER_SITE_OTHER): Payer: Medicare Other | Admitting: Psychiatry

## 2020-01-25 ENCOUNTER — Other Ambulatory Visit: Payer: Self-pay

## 2020-01-25 ENCOUNTER — Encounter: Payer: Self-pay | Admitting: Psychiatry

## 2020-01-25 DIAGNOSIS — F411 Generalized anxiety disorder: Secondary | ICD-10-CM | POA: Diagnosis not present

## 2020-01-25 DIAGNOSIS — F900 Attention-deficit hyperactivity disorder, predominantly inattentive type: Secondary | ICD-10-CM | POA: Diagnosis not present

## 2020-01-25 DIAGNOSIS — G4710 Hypersomnia, unspecified: Secondary | ICD-10-CM

## 2020-01-25 DIAGNOSIS — F331 Major depressive disorder, recurrent, moderate: Secondary | ICD-10-CM

## 2020-01-25 DIAGNOSIS — F4010 Social phobia, unspecified: Secondary | ICD-10-CM

## 2020-01-25 MED ORDER — MODAFINIL 100 MG PO TABS: 150.0000 mg | ORAL_TABLET | Freq: Every day | ORAL | 3 refills | Status: DC

## 2020-01-25 MED ORDER — ARIPIPRAZOLE 5 MG PO TABS: 5.0000 mg | ORAL_TABLET | Freq: Every day | ORAL | 1 refills | Status: DC

## 2020-01-25 MED ORDER — DULOXETINE HCL 60 MG PO CPEP: ORAL_CAPSULE | ORAL | 1 refills | Status: DC

## 2020-01-25 MED ORDER — DULOXETINE HCL 30 MG PO CPEP: ORAL_CAPSULE | ORAL | 1 refills | Status: DC
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
## 2020-01-29 DIAGNOSIS — M5416 Radiculopathy, lumbar region: Secondary | ICD-10-CM | POA: Diagnosis not present

## 2020-01-31 DIAGNOSIS — M5416 Radiculopathy, lumbar region: Secondary | ICD-10-CM | POA: Diagnosis not present

## 2020-02-04 ENCOUNTER — Telehealth: Payer: Self-pay | Admitting: Psychiatry

## 2020-02-04 ENCOUNTER — Telehealth: Payer: Self-pay

## 2020-02-04 DIAGNOSIS — M5416 Radiculopathy, lumbar region: Secondary | ICD-10-CM | POA: Diagnosis not present

## 2020-02-04 NOTE — Telephone Encounter (Signed)
Prior Authorization approval received for MODAFINIL 100 MG 1.5 TABS DAILY, effective 01/12/2020-01/10/2021 with [place], PA# [id number]

## 2020-02-04 NOTE — Telephone Encounter (Signed)
Lauren Bradley called asking the status of her prior authorization for Modafinil. I told her we'd call her back. [phone]

## 2020-02-04 NOTE — Telephone Encounter (Signed)
noted 

## 2020-02-04 NOTE — Telephone Encounter (Signed)
Disregard this message. It was just faxed this morning!!

## 2020-02-11 DIAGNOSIS — N393 Stress incontinence (female) (male): Secondary | ICD-10-CM | POA: Diagnosis not present

## 2020-02-11 DIAGNOSIS — K5900 Constipation, unspecified: Secondary | ICD-10-CM | POA: Diagnosis not present

## 2020-02-13 DIAGNOSIS — M5416 Radiculopathy, lumbar region: Secondary | ICD-10-CM | POA: Diagnosis not present

## 2020-02-14 DIAGNOSIS — L0591 Pilonidal cyst without abscess: Secondary | ICD-10-CM | POA: Diagnosis not present

## 2020-02-15 DIAGNOSIS — M5416 Radiculopathy, lumbar region: Secondary | ICD-10-CM | POA: Diagnosis not present

## 2020-02-18 DIAGNOSIS — K5900 Constipation, unspecified: Secondary | ICD-10-CM | POA: Diagnosis not present

## 2020-02-18 DIAGNOSIS — N393 Stress incontinence (female) (male): Secondary | ICD-10-CM | POA: Diagnosis not present

## 2020-02-20 DIAGNOSIS — H43812 Vitreous degeneration, left eye: Secondary | ICD-10-CM | POA: Diagnosis not present

## 2020-02-25 DIAGNOSIS — M5416 Radiculopathy, lumbar region: Secondary | ICD-10-CM | POA: Diagnosis not present

## 2020-02-26 DIAGNOSIS — G43719 Chronic migraine without aura, intractable, without status migrainosus: Secondary | ICD-10-CM | POA: Diagnosis not present

## 2020-03-03 DIAGNOSIS — M5416 Radiculopathy, lumbar region: Secondary | ICD-10-CM | POA: Diagnosis not present

## 2020-03-04 DIAGNOSIS — K5900 Constipation, unspecified: Secondary | ICD-10-CM | POA: Diagnosis not present

## 2020-03-04 DIAGNOSIS — N393 Stress incontinence (female) (male): Secondary | ICD-10-CM | POA: Diagnosis not present

## 2020-03-05 DIAGNOSIS — M5416 Radiculopathy, lumbar region: Secondary | ICD-10-CM | POA: Diagnosis not present

## 2020-03-05 DIAGNOSIS — J3081 Allergic rhinitis due to animal (cat) (dog) hair and dander: Secondary | ICD-10-CM | POA: Diagnosis not present

## 2020-03-05 DIAGNOSIS — J3089 Other allergic rhinitis: Secondary | ICD-10-CM | POA: Diagnosis not present

## 2020-03-05 DIAGNOSIS — J301 Allergic rhinitis due to pollen: Secondary | ICD-10-CM | POA: Diagnosis not present

## 2020-03-11 DIAGNOSIS — M5416 Radiculopathy, lumbar region: Secondary | ICD-10-CM | POA: Diagnosis not present

## 2020-03-17 DIAGNOSIS — M5416 Radiculopathy, lumbar region: Secondary | ICD-10-CM | POA: Diagnosis not present

## 2020-03-18 DIAGNOSIS — K5900 Constipation, unspecified: Secondary | ICD-10-CM | POA: Diagnosis not present

## 2020-03-18 DIAGNOSIS — N393 Stress incontinence (female) (male): Secondary | ICD-10-CM | POA: Diagnosis not present

## 2020-03-24 DIAGNOSIS — K5900 Constipation, unspecified: Secondary | ICD-10-CM | POA: Diagnosis not present

## 2020-03-24 DIAGNOSIS — N393 Stress incontinence (female) (male): Secondary | ICD-10-CM | POA: Diagnosis not present

## 2020-03-27 DIAGNOSIS — J3081 Allergic rhinitis due to animal (cat) (dog) hair and dander: Secondary | ICD-10-CM | POA: Diagnosis not present

## 2020-03-27 DIAGNOSIS — J301 Allergic rhinitis due to pollen: Secondary | ICD-10-CM | POA: Diagnosis not present

## 2020-03-27 DIAGNOSIS — J3089 Other allergic rhinitis: Secondary | ICD-10-CM | POA: Diagnosis not present

## 2020-04-07 DIAGNOSIS — M5416 Radiculopathy, lumbar region: Secondary | ICD-10-CM | POA: Diagnosis not present

## 2020-04-09 DIAGNOSIS — M5416 Radiculopathy, lumbar region: Secondary | ICD-10-CM | POA: Diagnosis not present

## 2020-04-10 DIAGNOSIS — J301 Allergic rhinitis due to pollen: Secondary | ICD-10-CM | POA: Diagnosis not present

## 2020-04-10 DIAGNOSIS — J3089 Other allergic rhinitis: Secondary | ICD-10-CM | POA: Diagnosis not present

## 2020-04-10 DIAGNOSIS — K5900 Constipation, unspecified: Secondary | ICD-10-CM | POA: Diagnosis not present

## 2020-04-10 DIAGNOSIS — N393 Stress incontinence (female) (male): Secondary | ICD-10-CM | POA: Diagnosis not present

## 2020-04-14 DIAGNOSIS — M5416 Radiculopathy, lumbar region: Secondary | ICD-10-CM | POA: Diagnosis not present

## 2020-04-17 DIAGNOSIS — J301 Allergic rhinitis due to pollen: Secondary | ICD-10-CM | POA: Diagnosis not present

## 2020-04-17 DIAGNOSIS — J3081 Allergic rhinitis due to animal (cat) (dog) hair and dander: Secondary | ICD-10-CM | POA: Diagnosis not present

## 2020-04-17 DIAGNOSIS — J3089 Other allergic rhinitis: Secondary | ICD-10-CM | POA: Diagnosis not present

## 2020-04-17 DIAGNOSIS — K5900 Constipation, unspecified: Secondary | ICD-10-CM | POA: Diagnosis not present

## 2020-04-17 DIAGNOSIS — N393 Stress incontinence (female) (male): Secondary | ICD-10-CM | POA: Diagnosis not present

## 2020-04-18 DIAGNOSIS — M5416 Radiculopathy, lumbar region: Secondary | ICD-10-CM | POA: Diagnosis not present

## 2020-04-22 DIAGNOSIS — J3089 Other allergic rhinitis: Secondary | ICD-10-CM | POA: Diagnosis not present

## 2020-04-22 DIAGNOSIS — J301 Allergic rhinitis due to pollen: Secondary | ICD-10-CM | POA: Diagnosis not present

## 2020-04-22 DIAGNOSIS — N393 Stress incontinence (female) (male): Secondary | ICD-10-CM | POA: Diagnosis not present

## 2020-04-22 DIAGNOSIS — J3081 Allergic rhinitis due to animal (cat) (dog) hair and dander: Secondary | ICD-10-CM | POA: Diagnosis not present

## 2020-04-22 DIAGNOSIS — K5900 Constipation, unspecified: Secondary | ICD-10-CM | POA: Diagnosis not present

## 2020-04-24 ENCOUNTER — Encounter: Payer: Self-pay | Admitting: Psychiatry

## 2020-04-24 ENCOUNTER — Other Ambulatory Visit: Payer: Self-pay

## 2020-04-24 ENCOUNTER — Ambulatory Visit (INDEPENDENT_AMBULATORY_CARE_PROVIDER_SITE_OTHER): Payer: Medicare Other | Admitting: Psychiatry

## 2020-04-24 DIAGNOSIS — G4710 Hypersomnia, unspecified: Secondary | ICD-10-CM

## 2020-04-24 DIAGNOSIS — F900 Attention-deficit hyperactivity disorder, predominantly inattentive type: Secondary | ICD-10-CM

## 2020-04-24 DIAGNOSIS — F331 Major depressive disorder, recurrent, moderate: Secondary | ICD-10-CM

## 2020-04-24 DIAGNOSIS — F411 Generalized anxiety disorder: Secondary | ICD-10-CM

## 2020-04-24 DIAGNOSIS — F4010 Social phobia, unspecified: Secondary | ICD-10-CM
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
## 2020-04-29 DIAGNOSIS — M5416 Radiculopathy, lumbar region: Secondary | ICD-10-CM | POA: Diagnosis not present

## 2020-04-30 ENCOUNTER — Other Ambulatory Visit: Payer: Self-pay | Admitting: Psychiatry

## 2020-04-30 MED ORDER — ALPRAZOLAM 0.5 MG PO TABS: ORAL_TABLET | ORAL | 0 refills | Status: DC

## 2020-05-01 DIAGNOSIS — J3081 Allergic rhinitis due to animal (cat) (dog) hair and dander: Secondary | ICD-10-CM | POA: Diagnosis not present

## 2020-05-01 DIAGNOSIS — K5900 Constipation, unspecified: Secondary | ICD-10-CM | POA: Diagnosis not present

## 2020-05-01 DIAGNOSIS — N393 Stress incontinence (female) (male): Secondary | ICD-10-CM | POA: Diagnosis not present

## 2020-05-01 DIAGNOSIS — J3089 Other allergic rhinitis: Secondary | ICD-10-CM | POA: Diagnosis not present

## 2020-05-01 DIAGNOSIS — J301 Allergic rhinitis due to pollen: Secondary | ICD-10-CM | POA: Diagnosis not present

## 2020-05-06 DIAGNOSIS — J3089 Other allergic rhinitis: Secondary | ICD-10-CM | POA: Diagnosis not present

## 2020-05-06 DIAGNOSIS — J3081 Allergic rhinitis due to animal (cat) (dog) hair and dander: Secondary | ICD-10-CM | POA: Diagnosis not present

## 2020-05-06 DIAGNOSIS — J301 Allergic rhinitis due to pollen: Secondary | ICD-10-CM | POA: Diagnosis not present

## 2020-05-07 DIAGNOSIS — M5416 Radiculopathy, lumbar region: Secondary | ICD-10-CM | POA: Diagnosis not present

## 2020-05-08 ENCOUNTER — Other Ambulatory Visit: Payer: Self-pay | Admitting: Psychiatry

## 2020-05-08 DIAGNOSIS — G4710 Hypersomnia, unspecified: Secondary | ICD-10-CM

## 2020-05-08 DIAGNOSIS — M7741 Metatarsalgia, right foot: Secondary | ICD-10-CM | POA: Diagnosis not present

## 2020-05-08 DIAGNOSIS — M7742 Metatarsalgia, left foot: Secondary | ICD-10-CM | POA: Diagnosis not present

## 2020-05-08 DIAGNOSIS — F900 Attention-deficit hyperactivity disorder, predominantly inattentive type: Secondary | ICD-10-CM

## 2020-05-08 DIAGNOSIS — M792 Neuralgia and neuritis, unspecified: Secondary | ICD-10-CM | POA: Diagnosis not present

## 2020-05-14 DIAGNOSIS — M5416 Radiculopathy, lumbar region: Secondary | ICD-10-CM | POA: Diagnosis not present

## 2020-05-20 DIAGNOSIS — J3081 Allergic rhinitis due to animal (cat) (dog) hair and dander: Secondary | ICD-10-CM | POA: Diagnosis not present

## 2020-05-20 DIAGNOSIS — J301 Allergic rhinitis due to pollen: Secondary | ICD-10-CM | POA: Diagnosis not present

## 2020-05-20 DIAGNOSIS — J3089 Other allergic rhinitis: Secondary | ICD-10-CM | POA: Diagnosis not present

## 2020-05-21 DIAGNOSIS — M5416 Radiculopathy, lumbar region: Secondary | ICD-10-CM | POA: Diagnosis not present

## 2020-05-27 DIAGNOSIS — G43719 Chronic migraine without aura, intractable, without status migrainosus: Secondary | ICD-10-CM | POA: Diagnosis not present

## 2020-06-02 DIAGNOSIS — J3089 Other allergic rhinitis: Secondary | ICD-10-CM | POA: Diagnosis not present

## 2020-06-02 DIAGNOSIS — J3081 Allergic rhinitis due to animal (cat) (dog) hair and dander: Secondary | ICD-10-CM | POA: Diagnosis not present

## 2020-06-02 DIAGNOSIS — J301 Allergic rhinitis due to pollen: Secondary | ICD-10-CM | POA: Diagnosis not present

## 2020-06-10 NOTE — Progress Notes (Signed)
Office Visit Note  Patient: Lauren Bradley             Date of Birth: 1950/10/17           MRN: [id number]             PCP: Lavone Orn, MD Referring: Lavone Orn, MD Visit Date: 06/24/2020 Occupation: @GUAROCC @  Subjective:  Pain in multiple joints.   History of Present Illness: Lauren Bradley is a 69 y.o. female history of osteoarthritis.  She states she continues to have pain and discomfort in her bilateral hands.  She also has some discomfort in her feet.  She has been exercising on a regular basis.  She states recently she developed some plantar fasciitis from the exercises.  She is planning to see her podiatrist for some shoe insoles.  She continues to have some lower back pain.  Her fibromyalgia symptoms also flare off and on.  She is concerned about the grip strength and fine motor movement in her hands.  Activities of Daily Living:  Patient reports morning stiffness for  1 hour.   Patient Reports nocturnal pain.  Difficulty dressing/grooming: Denies Difficulty climbing stairs: Denies Difficulty getting out of chair: Denies Difficulty using hands for taps, buttons, cutlery, and/or writing: Reports  Review of Systems  Constitutional:  Negative for fatigue.  HENT:  Negative for mouth sores, mouth dryness and nose dryness.   Eyes:  Positive for dryness. Negative for pain and itching.  Respiratory:  Negative for shortness of breath and difficulty breathing.   Cardiovascular:  Negative for chest pain and palpitations.  Gastrointestinal:  Negative for blood in stool, constipation and diarrhea.  Endocrine: Negative for increased urination.  Genitourinary:  Negative for difficulty urinating.  Musculoskeletal:  Positive for joint pain, joint pain, muscle weakness and morning stiffness. Negative for myalgias, muscle tenderness and myalgias.  Skin:  Negative for color change, rash and redness.  Allergic/Immunologic: Positive for susceptible to infections.  Neurological:   Positive for numbness and headaches. Negative for dizziness, memory loss and weakness.  Hematological:  Positive for bruising/bleeding tendency.  Psychiatric/Behavioral:  Negative for confusion.    PMFS History:  Patient Active Problem List   Diagnosis Date Noted   Major depressive disorder, single episode, severe ([place]) 10/06/2017   History of vitamin D deficiency 10/12/2016   History of seasonal allergies 12/29/2015   Fibromyalgia 12/29/2015   Other fatigue 12/29/2015   Primary osteoarthritis of both hands 12/29/2015   Primary osteoarthritis of both feet 12/29/2015   DJD (degenerative joint disease), cervical 12/29/2015   DDD (degenerative disc disease), lumbar 12/29/2015   Osteopenia of multiple sites 12/29/2015   History of migraine 12/29/2015    Past Medical History:  Diagnosis Date   Asthma    DDD (degenerative disc disease), cervical    DDD (degenerative disc disease), lumbar    Fatigue    Fibromyalgia    Infertility, female    Osteoarthritis     Family History  Problem Relation Age of Onset   Fibromyalgia Mother    Kidney failure Mother    Mental illness Mother    Heart disease Father    COPD Father    Cancer Father        colon, liver    Hypothyroidism Son    Past Surgical History:  Procedure Laterality Date   CESAREAN SECTION     FOOT SURGERY     GALLBLADDER SURGERY     KNEE SURGERY     Social History  Social History Narrative   Not on file   Immunization History  Administered Date(s) Administered   Moderna Sars-Covid-2 Vaccination 03/09/2019, 04/06/2019     Objective: Vital Signs: BP 134/79 (BP Location: Left Arm, Patient Position: Sitting, Cuff Size: Normal)   Pulse 99   Resp 14   Ht 5\' 3"  (1.6 m)   Wt 103 lb 12.8 oz (47.1 kg)   BMI 18.39 kg/m    Physical Exam Vitals and nursing note reviewed.  Constitutional:      Appearance: She is well-developed.  HENT:     Head: Normocephalic and atraumatic.  Eyes:     Conjunctiva/sclera:  Conjunctivae normal.  Cardiovascular:     Rate and Rhythm: Normal rate and regular rhythm.     Heart sounds: Normal heart sounds.  Pulmonary:     Effort: Pulmonary effort is normal.     Breath sounds: Normal breath sounds.  Abdominal:     General: Bowel sounds are normal.     Palpations: Abdomen is soft.  Musculoskeletal:     Cervical back: Normal range of motion.  Lymphadenopathy:     Cervical: No cervical adenopathy.  Skin:    General: Skin is warm and dry.     Capillary Refill: Capillary refill takes less than 2 seconds.  Neurological:     Mental Status: She is alert and oriented to person, place, and time.  Psychiatric:        Behavior: Behavior normal.     Musculoskeletal Exam: She has some limitation of range of motion of her cervical spine.  Shoulder joints, elbow joints, wrist joints with good range of motion.  She had bilateral PIP DIP and CMC thickening.  Hip joints and knee joints with good range of motion.  She had no tenderness over MTPs or PIPs.  CDAI Exam: CDAI Score: -- Patient Global: --; Provider Global: -- Swollen: --; Tender: -- Joint Exam 06/24/2020   No joint exam has been documented for this visit   There is currently no information documented on the homunculus. Go to the Rheumatology activity and complete the homunculus joint exam.  Investigation: No additional findings.  Imaging: No results found.  Recent Labs: Lab Results  Component Value Date   WBC 6.6 02/21/2017   HGB 12.9 02/21/2017   PLT 327 02/21/2017   NA 137 02/21/2017   K 4.3 02/21/2017   CL 102 02/21/2017   CO2 29 02/21/2017   GLUCOSE 88 02/21/2017   BUN 16 02/21/2017   CREATININE 0.58 02/21/2017   BILITOT 0.3 02/21/2017   ALKPHOS 28 (L) 12/30/2015   AST 22 02/21/2017   ALT 20 02/21/2017   PROT 6.8 02/21/2017   ALBUMIN 4.4 12/30/2015   CALCIUM 9.4 02/21/2017   GFRAA 112 02/21/2017    Speciality Comments: No specialty comments available.  Procedures:  No procedures  performed Allergies: Hydrocodone, Codeine, Other, Sulfa antibiotics, Cephalosporins, and Sulfamethoxazole   Assessment / Plan:     Visit Diagnoses: Primary osteoarthritis of both hands-she has been having pain and discomfort in her bilateral hands with decreased grip strength.  She also has problems with fine motor movement.  I will refer her to hand center for PT and OT.  Joint protection muscle strengthening was discussed.  Have given her a handout on hand exercises.  Primary osteoarthritis of both feet-she continues to have some discomfort.  She states she is suffering from Planter fasciitis currently.  She is planning to go and see her podiatrist.  DDD (degenerative disc disease), cervical-she  had some limitation with range of motion without discomfort.  DDD (degenerative disc disease), lumbar-she complains of chronic discomfort.  She has been doing core strengthening exercises.  Fibromyalgia-she has generalized pain and discomfort from fibromyalgia.  She is planning to go to pain management.  Other fatigue-related to fibromyalgia.  Osteopenia of multiple sites - According to the patient she had a recent bone density ordered by her PCP.  History of vitamin D deficiency  History of migraine  Orders: Orders Placed This Encounter  Procedures   Ambulatory referral to Hand Surgery    No orders of the defined types were placed in this encounter.    Follow-Up Instructions: Return in about 1 year (around 06/24/2021) for Osteoarthritis.   [name], MD  Note - This record has been created using Editor, commissioning.  Chart creation errors have been sought, but may not always  have been located. Such creation errors do not reflect on  the standard of medical care.

## 2020-06-19 DIAGNOSIS — L578 Other skin changes due to chronic exposure to nonionizing radiation: Secondary | ICD-10-CM | POA: Diagnosis not present

## 2020-06-19 DIAGNOSIS — Z85828 Personal history of other malignant neoplasm of skin: Secondary | ICD-10-CM | POA: Diagnosis not present

## 2020-06-19 DIAGNOSIS — L814 Other melanin hyperpigmentation: Secondary | ICD-10-CM | POA: Diagnosis not present

## 2020-06-19 DIAGNOSIS — L988 Other specified disorders of the skin and subcutaneous tissue: Secondary | ICD-10-CM | POA: Diagnosis not present

## 2020-06-19 DIAGNOSIS — D225 Melanocytic nevi of trunk: Secondary | ICD-10-CM | POA: Diagnosis not present

## 2020-06-19 DIAGNOSIS — L821 Other seborrheic keratosis: Secondary | ICD-10-CM | POA: Diagnosis not present

## 2020-06-19 DIAGNOSIS — L570 Actinic keratosis: Secondary | ICD-10-CM | POA: Diagnosis not present

## 2020-06-19 DIAGNOSIS — D2271 Melanocytic nevi of right lower limb, including hip: Secondary | ICD-10-CM | POA: Diagnosis not present

## 2020-06-19 DIAGNOSIS — L0591 Pilonidal cyst without abscess: Secondary | ICD-10-CM | POA: Diagnosis not present

## 2020-06-24 ENCOUNTER — Ambulatory Visit (INDEPENDENT_AMBULATORY_CARE_PROVIDER_SITE_OTHER): Payer: Medicare Other | Admitting: Rheumatology

## 2020-06-24 ENCOUNTER — Encounter: Payer: Self-pay | Admitting: Rheumatology

## 2020-06-24 ENCOUNTER — Other Ambulatory Visit: Payer: Self-pay

## 2020-06-24 VITALS — BP 134/79 | HR 99 | Resp 14 | Ht 63.0 in | Wt 103.8 lb

## 2020-06-24 DIAGNOSIS — Z8639 Personal history of other endocrine, nutritional and metabolic disease: Secondary | ICD-10-CM

## 2020-06-24 DIAGNOSIS — M5136 Other intervertebral disc degeneration, lumbar region: Secondary | ICD-10-CM

## 2020-06-24 DIAGNOSIS — M5416 Radiculopathy, lumbar region: Secondary | ICD-10-CM | POA: Diagnosis not present

## 2020-06-24 DIAGNOSIS — M19042 Primary osteoarthritis, left hand: Secondary | ICD-10-CM

## 2020-06-24 DIAGNOSIS — M797 Fibromyalgia: Secondary | ICD-10-CM

## 2020-06-24 DIAGNOSIS — M8589 Other specified disorders of bone density and structure, multiple sites: Secondary | ICD-10-CM | POA: Diagnosis not present

## 2020-06-24 DIAGNOSIS — R5383 Other fatigue: Secondary | ICD-10-CM | POA: Diagnosis not present

## 2020-06-24 DIAGNOSIS — Z8739 Personal history of other diseases of the musculoskeletal system and connective tissue: Secondary | ICD-10-CM

## 2020-06-24 DIAGNOSIS — M5030 Other cervical disc degeneration, unspecified cervical region: Secondary | ICD-10-CM

## 2020-06-24 DIAGNOSIS — R21 Rash and other nonspecific skin eruption: Secondary | ICD-10-CM

## 2020-06-24 DIAGNOSIS — M19041 Primary osteoarthritis, right hand: Secondary | ICD-10-CM

## 2020-06-24 DIAGNOSIS — M19072 Primary osteoarthritis, left ankle and foot: Secondary | ICD-10-CM | POA: Diagnosis not present

## 2020-06-24 DIAGNOSIS — M51369 Other intervertebral disc degeneration, lumbar region without mention of lumbar back pain or lower extremity pain: Secondary | ICD-10-CM

## 2020-06-24 DIAGNOSIS — M19071 Primary osteoarthritis, right ankle and foot: Secondary | ICD-10-CM

## 2020-06-24 DIAGNOSIS — Z8669 Personal history of other diseases of the nervous system and sense organs: Secondary | ICD-10-CM | POA: Diagnosis not present

## 2020-06-24 NOTE — Patient Instructions (Signed)
Hand Exercises Hand exercises can be helpful for almost anyone. These exercises can strengthen the hands, improve flexibility and movement, and increase blood flow to the hands. These results can make work and daily tasks easier. Hand exercises can be especially helpful for people who have joint pain from arthritis or have nerve damage from overuse (carpal tunnel syndrome). These exercises can also help people who have injured a hand. Exercises Most of these hand exercises are gentle stretching and motion exercises. It is usually safe to do them often throughout the day. Warming up your hands before exercise may help to reduce stiffness. You can do this with gentle massage orby placing your hands in warm water for 10-15 minutes. It is normal to feel some stretching, pulling, tightness, or mild discomfort as you begin new exercises. This will gradually improve. Stop an exercise right away if you feel sudden, severe pain or your pain gets worse. Ask your healthcare provider which exercises are best for you. Knuckle bend or "claw" fist Stand or sit with your arm, hand, and all five fingers pointed straight up. Make sure to keep your wrist straight during the exercise. Gently bend your fingers down toward your palm until the tips of your fingers are touching the top of your palm. Keep your big knuckle straight and just bend the small knuckles in your fingers. Hold this position for __________ seconds. Straighten (extend) your fingers back to the starting position. Repeat this exercise 5-10 times with each hand. Full finger fist Stand or sit with your arm, hand, and all five fingers pointed straight up. Make sure to keep your wrist straight during the exercise. Gently bend your fingers into your palm until the tips of your fingers are touching the middle of your palm. Hold this position for __________ seconds. Extend your fingers back to the starting position, stretching every joint fully. Repeat this  exercise 5-10 times with each hand. Straight fist Stand or sit with your arm, hand, and all five fingers pointed straight up. Make sure to keep your wrist straight during the exercise. Gently bend your fingers at the big knuckle, where your fingers meet your hand, and the middle knuckle. Keep the knuckle at the tips of your fingers straight and try to touch the bottom of your palm. Hold this position for __________ seconds. Extend your fingers back to the starting position, stretching every joint fully. Repeat this exercise 5-10 times with each hand. Tabletop Stand or sit with your arm, hand, and all five fingers pointed straight up. Make sure to keep your wrist straight during the exercise. Gently bend your fingers at the big knuckle, where your fingers meet your hand, as far down as you can while keeping the small knuckles in your fingers straight. Think of forming a tabletop with your fingers. Hold this position for __________ seconds. Extend your fingers back to the starting position, stretching every joint fully. Repeat this exercise 5-10 times with each hand. Finger spread Place your hand flat on a table with your palm facing down. Make sure your wrist stays straight as you do this exercise. Spread your fingers and thumb apart from each other as far as you can until you feel a gentle stretch. Hold this position for __________ seconds. Bring your fingers and thumb tight together again. Hold this position for __________ seconds. Repeat this exercise 5-10 times with each hand. Making circles Stand or sit with your arm, hand, and all five fingers pointed straight up. Make sure to keep your   wrist straight during the exercise. Make a circle by touching the tip of your thumb to the tip of your index finger. Hold for __________ seconds. Then open your hand wide. Repeat this motion with your thumb and each finger on your hand. Repeat this exercise 5-10 times with each hand. Thumb motion Sit  with your forearm resting on a table and your wrist straight. Your thumb should be facing up toward the ceiling. Keep your fingers relaxed as you move your thumb. Lift your thumb up as high as you can toward the ceiling. Hold for __________ seconds. Bend your thumb across your palm as far as you can, reaching the tip of your thumb for the small finger (pinkie) side of your palm. Hold for __________ seconds. Repeat this exercise 5-10 times with each hand. Grip strengthening  Hold a stress ball or other soft ball in the middle of your hand. Slowly increase the pressure, squeezing the ball as much as you can without causing pain. Think of bringing the tips of your fingers into the middle of your palm. All of your finger joints should bend when doing this exercise. Hold your squeeze for __________ seconds, then relax. Repeat this exercise 5-10 times with each hand. Contact a health care provider if: Your hand pain or discomfort gets much worse when you do an exercise. Your hand pain or discomfort does not improve within 2 hours after you exercise. If you have any of these problems, stop doing these exercises right away. Do not do them again unless your health care provider says that you can. Get help right away if: You develop sudden, severe hand pain or swelling. If this happens, stop doing these exercises right away. Do not do them again unless your health care provider says that you can. This information is not intended to replace advice given to you by your health care provider. Make sure you discuss any questions you have with your healthcare provider. Document Revised: 04/20/2018 Document Reviewed: 12/29/2017 Elsevier Patient Education  2022 [employer]  

## 2020-06-25 ENCOUNTER — Ambulatory Visit (INDEPENDENT_AMBULATORY_CARE_PROVIDER_SITE_OTHER): Payer: Medicare Other | Admitting: Psychiatry

## 2020-06-25 ENCOUNTER — Encounter: Payer: Self-pay | Admitting: Psychiatry

## 2020-06-25 DIAGNOSIS — F900 Attention-deficit hyperactivity disorder, predominantly inattentive type: Secondary | ICD-10-CM | POA: Diagnosis not present

## 2020-06-25 DIAGNOSIS — J3089 Other allergic rhinitis: Secondary | ICD-10-CM | POA: Diagnosis not present

## 2020-06-25 DIAGNOSIS — G4710 Hypersomnia, unspecified: Secondary | ICD-10-CM

## 2020-06-25 DIAGNOSIS — J301 Allergic rhinitis due to pollen: Secondary | ICD-10-CM | POA: Diagnosis not present

## 2020-06-25 DIAGNOSIS — J3081 Allergic rhinitis due to animal (cat) (dog) hair and dander: Secondary | ICD-10-CM | POA: Diagnosis not present

## 2020-06-25 MED ORDER — MODAFINIL 100 MG PO TABS: ORAL_TABLET | ORAL | 1 refills | Status: DC

## 2020-06-25 NOTE — Progress Notes (Signed)
Lauren Bradley [id number] 25-Jun-1950 70 y.o.   Subjective:   Patient ID:  Lauren Bradley is a 70 y.o. (DOB [birth year]) female.  Chief Complaint:  Chief Complaint  Patient presents with   Follow-up   Major depressive disorder, recurrent episode, moderate (HCC)    Depression        Associated symptoms include fatigue, myalgias and headaches.  Associated symptoms include no decreased concentration and no suicidal ideas. Lauren Bradley presents  today for follow-up of depression, anxiety and insomnia and chronic pain.   At visit Jun 08, 2018.  No major meds were changed except she was trying to gradually increase the Abilify to 2 mg daily for optimal mood effect.  I feel much better on the Abilify.  Makes my brain feel more normal and focus is better and calmer overall.   visit August 31, 2018.  She had asked to increase Abilify from 2 to 3 mg daily.  She had seen Abilify benefit at 2 mg but wished for additional benefit for mood and anxiety.  Last visit November 2020.  The following was noted: Has done well on Abilify 3 mg with additional improvement in mood, but still has a lot of anxiety.  Dr. Laurann Bradley noticed the difference in her mood.  Managed the weight and lost to baseline.  Wonders about the increase to 5 mg to help anxiety.  Benefit for energy which is reasonable with less napping than before Abilify.  Took half Provigil last week when driving distance and did ok with it.   Helped ADHD.  Function is better.  Still some days flounder.  Not as good as paperwork years ago but better with Abilify.  Helped having Lauren Bradley at home.  Was poor function before Abilify despite trying hard. Abilify helped the depression clearly and very beneficial.  Not always sad now.  HA resolved.  Initially it caused great energy like a motor running and reduced need for sleep to 5-6 hours.  That has resolved.  SE weight gain without increasing calories.  Changed her sleep schedule for the better.  To bed earlier  and up earlier.    I didn't realize how depressed I was.  Better organization and function and productivity.  Abilify helped regulate and correct her sleep wake schedule.  Not oversleeping now. Anxiety remains high chronically.  Can talk too much when anxious and her accountant commented on it to her.  QT has been good for her.  Chronic social anxiety.  Can be crippled from anxiety.   Plan: Trial increase Abilify 5 mg daily in hopes of further reducing anxiety and depressive symptoms.  05/31/2019 appointment the following is noted: Sleep well with naltrexone and alprazolam 0.25 mg HS. Better energy and alertness with modafinil 100 but a little less effect over time.  Some degree of tolerance. Hard to get paperwork done. Done well with aripiprazole 5 mg daily. Helped energy and coping with calmer manner. So much less depressed with Abilify.  Concerns over Hgb A1C which had been borderline in the past and is up a little to 5.9 now.  No extra weight at this point.  Is exercising.     Stressed still dealing with hypochondriac mother with constant somatization and complaining and anxiety and frequently wanting to go to the ER.  Better handling it with Abilify.  Disc management with this. Mother compulsively and annoyingly call her repeatedly.  Feels shame over the feelings she has about her mother.  Major boundary issues from  her mother.  Mother was neglectful in her childhood.  Recognizes some blacked out memories from her childhood.  Asked how to deal with this with mother.  09/11/19 appt with the following noted: Not quite as well with stress from mother bc B doesn't help much. Concerned about Abilify poss effecting glucose.  Did have high AIC in past even before Abilify.  Has to watch carbs.  Plans to see PCP Lauren Bradley again about it. Poor heat tolerance this year.  Wonders if related to modafinil.  Went up to 150 mg and didn't notice a lot of issues.   No SE otherwise.  Is able to do some paperwork  more effectively lately.  More pain intolerant as well. No problems with sleep affected by modafinil.  2 dogs sleep with her.  Cough can wake her worse off tramadol.   Lost her dog to pancreatic CA.  Good help with homeopathic vet.  Feels guilty he had fever and she didn't realize it at the time. Plan: no med changes.  Continue Abilfy 5 which helped and increased modafinil to 150. And other meds.  11/21/2019 appointment with the following noted: Stress care of mother and needs help.  Wonders about moving mother in to help financially.  House needs work. Does everything for mother now.    B's son shot himself at her mother's home. Doesn't feel B respects mother and it causes problems. Realized big issues wiith ExH.  Lauren Bradley's graduation brought up some of this she thought she had addressed. Plan: no med changes  01/25/2020 appointment with the following noted: Modafinil 150 mg helped daytime sleepiness.   Sleep has been reset somewhat to a more typical sleep pattern.  Able to stay awake driving better now with modafinil.  Abilify helped energy and reset body clock.   Mo is still a handful.  When not deeply depressed she wants to shop.  Focus hard for mother.  Also concerned about paranoia issues with mother. Angst about dx of new skin diagnosis.   Stress ExH with someone known for a long time and upsetting.  Lost 10# over that.  4-5# underweight.   Also uncertainty about whether or not to get a job.  Lauren Bradley is very picky eater and difficult to prepare food for her.  Lauren Bradley is very time demanding and loses things.   Pt dx with PXE.  Plan further improvement was noted with Abilify increased to 5 mg daily so no med changes today  04/24/2020 appointment with following noted: Her Lauren Bradley comes to appt next week.   Disc boundary setting with mother who can be overwhelming. Disc Questions around Chronic viral illness complications.  She got better with diet changes and weight lifting. Worked on CBT to deal with  negative thinking and mother.  Talks with her daily. Hardd to deal with Lauren Bradley's GF. Plan no med changes  06/25/2020 appointment with the following noted: Disc concerns about Covid vaccine and questions.  A lot of fatigue. Exercise a lot more.   Stress alimony drops August 1 and worry.  Also with drop in stock market. Causing anxiety.   Gets 7 hours sleep but weekends 8-9 hours.   Keep wanting to wean Xanax.  Disc concerns. Disc relationship concerns around Ex. Not shakey or jittery. Exercise twice daily.  Busy all day.   Some social anxiety still significant.  Past Psychiatric Medication Trials: Abilify 3 mg helpful,  duloxetine 90, venlafaxine irritable, buspirone side effects, trycyclic antidepressants, Wellbutrin, Zoloft, Paxil, Modafinil 150, Vyvanse  30, Ritalin, Adderall,   propranolol Belsomra, Lunesta, Ambien, trazodone, alprazolam, doxepin, mirtazapine, clonazepam  Review of Systems:  Review of Systems  Constitutional:  Positive for fatigue.  Respiratory:  Positive for cough and shortness of breath.   Cardiovascular:  Negative for palpitations.  Musculoskeletal:  Positive for arthralgias, back pain and myalgias. Negative for gait problem.  Skin:  Positive for rash.  Neurological:  Positive for headaches. Negative for dizziness, tremors and weakness.  Psychiatric/Behavioral:  Negative for agitation, behavioral problems, confusion, decreased concentration, dysphoric mood, hallucinations, self-injury, sleep disturbance and suicidal ideas. The patient is nervous/anxious. The patient is not hyperactive.    Medications: I have reviewed the patient's current medications.  Current Outpatient Medications  Medication Sig Dispense Refill   ALPRAZolam (XANAX) 0.5 MG tablet TAKE 2 TABLETS BY MOUTH EVERY NIGHT AT BEDTIME AND 1 TABLET EVERY DAY AS NEEDED (Patient taking differently: Take 0.25 mg by mouth at bedtime. TAKE 2 TABLETS BY MOUTH EVERY NIGHT AT BEDTIME AND 1 TABLET EVERY DAY AS  NEEDED) 75 tablet 0   ARIPiprazole (ABILIFY) 5 MG tablet Take 1 tablet (5 mg total) by mouth daily. 90 tablet 1   azelastine (ASTELIN) 0.1 % nasal spray      calcium carbonate (OS-CAL) 600 MG TABS tablet Take 600 mg by mouth.     DULoxetine (CYMBALTA) 30 MG capsule TAKE ONE CAPSULE BY MOUTH ONCE DAILY WITH ONE 60mg  CAPSULE 90 capsule 1   DULoxetine (CYMBALTA) 60 MG capsule TAKE ONE CAPSULE BY MOUTH ONCE DAILY WITH ONE 30mg  CAPSULE 90 capsule 1   EPINEPHrine (EPIPEN JR) 0.15 MG/0.3ML injection      estradiol (VIVELLE-DOT) 0.075 MG/24HR estradiol 0.075 mg/24 hr semiweekly transdermal patch  APPLY 1 PATCH TO SKIN TWICE WEEKLY AS DIRECTED     fexofenadine (ALLEGRA) 180 MG tablet Take 180 mg by mouth daily.     Levomefolate Glucosamine (METHYLFOLATE PO) Take by mouth.     MAGNESIUM MALATE PO Take by mouth daily.     NP THYROID 15 MG tablet      progesterone (PROMETRIUM) 200 MG capsule Take by mouth.     sodium fluoride (FLUORISHIELD) 1.1 % GEL dental gel SF 5000 Plus 1.1 % dental cream     SUMAtriptan (IMITREX) 100 MG tablet as needed.     thyroid (ARMOUR) 90 MG tablet TAKE 1 TABLET BY MOUTH EVERY DAY ON AN EMPTY STOMACH WITH 15 MG DOSE     Vitamin D, Ergocalciferol, (DRISDOL) 50000 units CAPS capsule Take 50,000 Units by mouth every 7 (seven) days.      modafinil (PROVIGIL) 100 MG tablet 1 and 1/2 in the AM and 1/2 at noon 60 tablet 1   No current facility-administered medications for this visit.    Medication Side Effects: HA stopped with Abilify, weight gain, hungry is odd  Allergies:  Allergies  Allergen Reactions   Hydrocodone Itching   Codeine Nausea And Vomiting   Other    Sulfa Antibiotics    Cephalosporins Rash   Sulfamethoxazole Rash    Unknown reaction    Past Medical History:  Diagnosis Date   Asthma    DDD (degenerative disc disease), cervical    DDD (degenerative disc disease), lumbar    Fatigue    Fibromyalgia    Infertility, female    Osteoarthritis      Family History  Problem Relation Age of Onset   Fibromyalgia Mother    Kidney failure Mother    Mental illness Mother    Heart disease  Father    COPD Father    Cancer Father        colon, liver    Hypothyroidism Son     Social History   Socioeconomic History   Marital status: Single    Spouse name: Not on file   Number of children: Not on file   Years of education: Not on file   Highest education level: Not on file  Occupational History   Not on file  Tobacco Use   Smoking status: Never   Smokeless tobacco: Never  Vaping Use   Vaping Use: Never used  Substance and Sexual Activity   Alcohol use: Not Currently   Drug use: No   Sexual activity: Not on file  Other Topics Concern   Not on file  Social History Narrative   Not on file   Social Determinants of Health   Financial Resource Strain: Not on file  Food Insecurity: Not on file  Transportation Needs: Not on file  Physical Activity: Not on file  Stress: Not on file  Social Connections: Not on file  Intimate Partner Violence: Not on file    Past Medical History, Surgical history, Social history, and Family history were reviewed and updated as appropriate.   Please see review of systems for further details on the patient's review from today.   Objective:   Physical Exam:  There were no vitals taken for this visit.  Physical Exam Constitutional:      General: She is not in acute distress. Musculoskeletal:        General: No deformity.  Neurological:     Mental Status: She is alert and oriented to person, place, and time.     Cranial Nerves: No dysarthria.     Coordination: Coordination normal.  Psychiatric:        Attention and Perception: Attention and perception normal. She does not perceive auditory or visual hallucinations.        Mood and Affect: Mood is anxious. Mood is not depressed. Affect is not labile, blunt, angry, tearful or inappropriate.        Speech: Speech normal. Speech is not  slurred.        Behavior: Behavior normal. Behavior is cooperative.        Thought Content: Thought content normal. Thought content is not paranoid or delusional. Thought content does not include homicidal or suicidal ideation. Thought content does not include homicidal or suicidal plan.        Cognition and Memory: Cognition and memory normal.        Judgment: Judgment normal.     Comments: Insight fair to good.  Depression has almost resolved with the Abilify for the most part.  Anxiety remains moderate worse with relational issues eith Ex     Lab Review:     Component Value Date/Time   NA 137 02/21/2017 1635   K 4.3 02/21/2017 1635   CL 102 02/21/2017 1635   CO2 29 02/21/2017 1635   GLUCOSE 88 02/21/2017 1635   BUN 16 02/21/2017 1635   CREATININE 0.58 02/21/2017 1635   CALCIUM 9.4 02/21/2017 1635   PROT 6.8 02/21/2017 1635   ALBUMIN 4.4 12/30/2015 1507   AST 22 02/21/2017 1635   ALT 20 02/21/2017 1635   ALKPHOS 28 (L) 12/30/2015 1507   BILITOT 0.3 02/21/2017 1635   GFRNONAA 97 02/21/2017 1635   GFRAA 112 02/21/2017 1635       Component Value Date/Time   WBC 6.6 02/21/2017 1635  RBC 4.36 02/21/2017 1635   HGB 12.9 02/21/2017 1635   HCT 38.2 02/21/2017 1635   PLT 327 02/21/2017 1635   MCV 87.6 02/21/2017 1635   MCH 29.6 02/21/2017 1635   MCHC 33.8 02/21/2017 1635   RDW 12.4 02/21/2017 1635   LYMPHSABS 1,228 02/21/2017 1635   MONOABS 402 12/30/2015 1507   EOSABS 132 02/21/2017 1635   BASOSABS 73 02/21/2017 1635    No results found for: POCLITH, LITHIUM   No results found for: PHENYTOIN, PHENOBARB, VALPROATE, CBMZ   .res Assessment: Plan:    Attention deficit hyperactivity disorder (ADHD), predominantly inattentive type - Plan: modafinil (PROVIGIL) 100 MG tablet  Hypersomnolence disorder, persistent - Plan: modafinil (PROVIGIL) 100 MG tablet   Less depressed with the Abilify clear benefit for depression and anxiety.   It's helped productivity and mood and  energy and less hypersomnolence. Further improvement with increase Abilify to 5 mg daily.  Disc glucose and A1c again.Marland Kitchen    Option check labs and Hgb A1C bc in past was prediabetic. She's using buffered berberine.  She'd like to hold off on metformin.  We discussed the short-term risks associated with benzodiazepines including sedation and increased fall risk among others.  Discussed long-term side effect risk including dependence, potential withdrawal symptoms, and the potential eventual dose-related risk of dementia.  But recent studies from 2020 dispute this association between benzodiazepines and dementia risk. Newer studies in 2020 do not support an association with dementia. Extensive discussion of it.   She was successful at reducing Xanax for sleep.  Discussed potential metabolic side effects associated with atypical antipsychotics, as well as potential risk for movement side effects. Advised pt to contact office if movement side effects occur.   Later could consider reducing duloxetine for cost reasons. Continue duloxetine 90.Marland Kitchen  Disc risk sweating  And heat intolerance with it.  Answered questions about tramadol with asthma cough. SE.   Increase modafinil 150 mg and 50 mg Noon for hypersomnolence and ADD.  She doesn't tolerate traditional stimulants like Adderall and MPH..She has gotten benefit so far with it.  Disc pros and cons of increasing.  Protect sleep and try to get more if possible.    Modafinil and Abilify have corrected delayed sleep phase disorder.  No med changes today.  Stressed still dealing with hypochondriac mother with constant somatization and complaining and anxiety and frequently wanting to go to the ER.  Disc management with this.  Some advantages but potentiallly very stressful.  Disc way to have a trial run with this to see if it's manageable from mental health perspective.  Lauren Bradley intrusive and demanding.   Stress of dealing with the lying and manipulation of her  mother.  Disc this in therapy. And Family problems.  This is a chronic problem.  Remains very stressed with mother.  Having to help her a lot.  Disc limit setting and boundaries.  Disc regular scheduling PCP visits.   Disc potential plan to have mother move in to her home.  Disc relationship issues with other family dix pros and cons of whether or not to get a job and how to make that decision.   Supportive therapy around dealing with Ex.  Also around loneliness.  45 min appt  FU 3 mos  Lynder Parents, MD, DFAPA   Please see After Visit Summary for patient specific instructions.  Future Appointments  Date Time Provider [place]  06/23/2021  2:30 PM [name], MD [facility] None    No orders  of the defined types were placed in this encounter.     -------------------------------

## 2020-07-09 ENCOUNTER — Other Ambulatory Visit: Payer: Self-pay | Admitting: Psychiatry

## 2020-07-09 DIAGNOSIS — J3089 Other allergic rhinitis: Secondary | ICD-10-CM | POA: Diagnosis not present

## 2020-07-09 DIAGNOSIS — F4010 Social phobia, unspecified: Secondary | ICD-10-CM

## 2020-07-09 DIAGNOSIS — J3081 Allergic rhinitis due to animal (cat) (dog) hair and dander: Secondary | ICD-10-CM | POA: Diagnosis not present

## 2020-07-09 DIAGNOSIS — J301 Allergic rhinitis due to pollen: Secondary | ICD-10-CM | POA: Diagnosis not present

## 2020-07-09 DIAGNOSIS — F411 Generalized anxiety disorder: Secondary | ICD-10-CM

## 2020-07-09 DIAGNOSIS — F331 Major depressive disorder, recurrent, moderate: Secondary | ICD-10-CM

## 2020-07-21 DIAGNOSIS — M25632 Stiffness of left wrist, not elsewhere classified: Secondary | ICD-10-CM | POA: Diagnosis not present

## 2020-07-21 DIAGNOSIS — M25642 Stiffness of left hand, not elsewhere classified: Secondary | ICD-10-CM | POA: Diagnosis not present

## 2020-07-21 DIAGNOSIS — M25641 Stiffness of right hand, not elsewhere classified: Secondary | ICD-10-CM | POA: Diagnosis not present

## 2020-07-21 DIAGNOSIS — M25631 Stiffness of right wrist, not elsewhere classified: Secondary | ICD-10-CM | POA: Diagnosis not present

## 2020-07-21 DIAGNOSIS — R29898 Other symptoms and signs involving the musculoskeletal system: Secondary | ICD-10-CM | POA: Diagnosis not present

## 2020-07-29 DIAGNOSIS — J3089 Other allergic rhinitis: Secondary | ICD-10-CM | POA: Diagnosis not present

## 2020-07-29 DIAGNOSIS — M25642 Stiffness of left hand, not elsewhere classified: Secondary | ICD-10-CM | POA: Diagnosis not present

## 2020-07-29 DIAGNOSIS — J301 Allergic rhinitis due to pollen: Secondary | ICD-10-CM | POA: Diagnosis not present

## 2020-07-29 DIAGNOSIS — M25631 Stiffness of right wrist, not elsewhere classified: Secondary | ICD-10-CM | POA: Diagnosis not present

## 2020-07-29 DIAGNOSIS — M79641 Pain in right hand: Secondary | ICD-10-CM | POA: Diagnosis not present

## 2020-07-29 DIAGNOSIS — J3081 Allergic rhinitis due to animal (cat) (dog) hair and dander: Secondary | ICD-10-CM | POA: Diagnosis not present

## 2020-07-29 DIAGNOSIS — M25641 Stiffness of right hand, not elsewhere classified: Secondary | ICD-10-CM | POA: Diagnosis not present

## 2020-07-29 DIAGNOSIS — R29898 Other symptoms and signs involving the musculoskeletal system: Secondary | ICD-10-CM | POA: Diagnosis not present

## 2020-07-29 DIAGNOSIS — M25632 Stiffness of left wrist, not elsewhere classified: Secondary | ICD-10-CM | POA: Diagnosis not present

## 2020-07-29 DIAGNOSIS — M79642 Pain in left hand: Secondary | ICD-10-CM | POA: Diagnosis not present

## 2020-08-05 DIAGNOSIS — M79641 Pain in right hand: Secondary | ICD-10-CM | POA: Diagnosis not present

## 2020-08-05 DIAGNOSIS — M25642 Stiffness of left hand, not elsewhere classified: Secondary | ICD-10-CM | POA: Diagnosis not present

## 2020-08-05 DIAGNOSIS — R29898 Other symptoms and signs involving the musculoskeletal system: Secondary | ICD-10-CM | POA: Diagnosis not present

## 2020-08-05 DIAGNOSIS — M25641 Stiffness of right hand, not elsewhere classified: Secondary | ICD-10-CM | POA: Diagnosis not present

## 2020-08-05 DIAGNOSIS — M25631 Stiffness of right wrist, not elsewhere classified: Secondary | ICD-10-CM | POA: Diagnosis not present

## 2020-08-05 DIAGNOSIS — M25632 Stiffness of left wrist, not elsewhere classified: Secondary | ICD-10-CM | POA: Diagnosis not present

## 2020-08-05 DIAGNOSIS — M79642 Pain in left hand: Secondary | ICD-10-CM | POA: Diagnosis not present

## 2020-08-12 ENCOUNTER — Other Ambulatory Visit: Payer: Self-pay | Admitting: Psychiatry

## 2020-08-12 DIAGNOSIS — M25641 Stiffness of right hand, not elsewhere classified: Secondary | ICD-10-CM | POA: Diagnosis not present

## 2020-08-12 DIAGNOSIS — F331 Major depressive disorder, recurrent, moderate: Secondary | ICD-10-CM

## 2020-08-12 DIAGNOSIS — M79642 Pain in left hand: Secondary | ICD-10-CM | POA: Diagnosis not present

## 2020-08-12 DIAGNOSIS — M25631 Stiffness of right wrist, not elsewhere classified: Secondary | ICD-10-CM | POA: Diagnosis not present

## 2020-08-12 DIAGNOSIS — M79641 Pain in right hand: Secondary | ICD-10-CM | POA: Diagnosis not present

## 2020-08-12 DIAGNOSIS — J3081 Allergic rhinitis due to animal (cat) (dog) hair and dander: Secondary | ICD-10-CM | POA: Diagnosis not present

## 2020-08-12 DIAGNOSIS — M25632 Stiffness of left wrist, not elsewhere classified: Secondary | ICD-10-CM | POA: Diagnosis not present

## 2020-08-12 DIAGNOSIS — J301 Allergic rhinitis due to pollen: Secondary | ICD-10-CM | POA: Diagnosis not present

## 2020-08-12 DIAGNOSIS — J3089 Other allergic rhinitis: Secondary | ICD-10-CM | POA: Diagnosis not present

## 2020-08-12 DIAGNOSIS — R29898 Other symptoms and signs involving the musculoskeletal system: Secondary | ICD-10-CM | POA: Diagnosis not present

## 2020-08-12 DIAGNOSIS — M25642 Stiffness of left hand, not elsewhere classified: Secondary | ICD-10-CM | POA: Diagnosis not present

## 2020-08-13 ENCOUNTER — Other Ambulatory Visit: Payer: Self-pay | Admitting: Psychiatry

## 2020-08-13 DIAGNOSIS — F900 Attention-deficit hyperactivity disorder, predominantly inattentive type: Secondary | ICD-10-CM

## 2020-08-13 DIAGNOSIS — G4710 Hypersomnia, unspecified: Secondary | ICD-10-CM

## 2020-08-18 DIAGNOSIS — J3081 Allergic rhinitis due to animal (cat) (dog) hair and dander: Secondary | ICD-10-CM | POA: Diagnosis not present

## 2020-08-18 DIAGNOSIS — J3089 Other allergic rhinitis: Secondary | ICD-10-CM | POA: Diagnosis not present

## 2020-08-18 DIAGNOSIS — J301 Allergic rhinitis due to pollen: Secondary | ICD-10-CM | POA: Diagnosis not present

## 2020-08-19 DIAGNOSIS — M25632 Stiffness of left wrist, not elsewhere classified: Secondary | ICD-10-CM | POA: Diagnosis not present

## 2020-08-19 DIAGNOSIS — M79641 Pain in right hand: Secondary | ICD-10-CM | POA: Diagnosis not present

## 2020-08-19 DIAGNOSIS — M79642 Pain in left hand: Secondary | ICD-10-CM | POA: Diagnosis not present

## 2020-08-19 DIAGNOSIS — M25642 Stiffness of left hand, not elsewhere classified: Secondary | ICD-10-CM | POA: Diagnosis not present

## 2020-08-19 DIAGNOSIS — M25641 Stiffness of right hand, not elsewhere classified: Secondary | ICD-10-CM | POA: Diagnosis not present

## 2020-08-19 DIAGNOSIS — R29898 Other symptoms and signs involving the musculoskeletal system: Secondary | ICD-10-CM | POA: Diagnosis not present

## 2020-08-19 DIAGNOSIS — M25631 Stiffness of right wrist, not elsewhere classified: Secondary | ICD-10-CM | POA: Diagnosis not present

## 2020-08-25 DIAGNOSIS — J3089 Other allergic rhinitis: Secondary | ICD-10-CM | POA: Diagnosis not present

## 2020-08-25 DIAGNOSIS — J301 Allergic rhinitis due to pollen: Secondary | ICD-10-CM | POA: Diagnosis not present

## 2020-08-26 DIAGNOSIS — M25642 Stiffness of left hand, not elsewhere classified: Secondary | ICD-10-CM | POA: Diagnosis not present

## 2020-08-26 DIAGNOSIS — R29898 Other symptoms and signs involving the musculoskeletal system: Secondary | ICD-10-CM | POA: Diagnosis not present

## 2020-08-26 DIAGNOSIS — M79641 Pain in right hand: Secondary | ICD-10-CM | POA: Diagnosis not present

## 2020-08-26 DIAGNOSIS — G43719 Chronic migraine without aura, intractable, without status migrainosus: Secondary | ICD-10-CM | POA: Diagnosis not present

## 2020-08-26 DIAGNOSIS — M79642 Pain in left hand: Secondary | ICD-10-CM | POA: Diagnosis not present

## 2020-08-26 DIAGNOSIS — M25641 Stiffness of right hand, not elsewhere classified: Secondary | ICD-10-CM | POA: Diagnosis not present

## 2020-08-29 DIAGNOSIS — M159 Polyosteoarthritis, unspecified: Secondary | ICD-10-CM | POA: Diagnosis not present

## 2020-08-29 DIAGNOSIS — G43709 Chronic migraine without aura, not intractable, without status migrainosus: Secondary | ICD-10-CM | POA: Diagnosis not present

## 2020-08-29 DIAGNOSIS — D509 Iron deficiency anemia, unspecified: Secondary | ICD-10-CM | POA: Diagnosis not present

## 2020-08-29 DIAGNOSIS — K219 Gastro-esophageal reflux disease without esophagitis: Secondary | ICD-10-CM | POA: Diagnosis not present

## 2020-08-29 DIAGNOSIS — Z1389 Encounter for screening for other disorder: Secondary | ICD-10-CM | POA: Diagnosis not present

## 2020-08-29 DIAGNOSIS — M797 Fibromyalgia: Secondary | ICD-10-CM | POA: Diagnosis not present

## 2020-08-29 DIAGNOSIS — E039 Hypothyroidism, unspecified: Secondary | ICD-10-CM | POA: Diagnosis not present

## 2020-08-29 DIAGNOSIS — R030 Elevated blood-pressure reading, without diagnosis of hypertension: Secondary | ICD-10-CM | POA: Diagnosis not present

## 2020-08-29 DIAGNOSIS — R739 Hyperglycemia, unspecified: Secondary | ICD-10-CM | POA: Diagnosis not present

## 2020-08-29 DIAGNOSIS — Z0000 Encounter for general adult medical examination without abnormal findings: Secondary | ICD-10-CM | POA: Diagnosis not present

## 2020-09-01 DIAGNOSIS — J3089 Other allergic rhinitis: Secondary | ICD-10-CM | POA: Diagnosis not present

## 2020-09-01 DIAGNOSIS — J3081 Allergic rhinitis due to animal (cat) (dog) hair and dander: Secondary | ICD-10-CM | POA: Diagnosis not present

## 2020-09-01 DIAGNOSIS — J301 Allergic rhinitis due to pollen: Secondary | ICD-10-CM | POA: Diagnosis not present

## 2020-09-09 DIAGNOSIS — M25632 Stiffness of left wrist, not elsewhere classified: Secondary | ICD-10-CM | POA: Diagnosis not present

## 2020-09-09 DIAGNOSIS — M25631 Stiffness of right wrist, not elsewhere classified: Secondary | ICD-10-CM | POA: Diagnosis not present

## 2020-09-09 DIAGNOSIS — R29898 Other symptoms and signs involving the musculoskeletal system: Secondary | ICD-10-CM | POA: Diagnosis not present

## 2020-09-09 DIAGNOSIS — J3089 Other allergic rhinitis: Secondary | ICD-10-CM | POA: Diagnosis not present

## 2020-09-09 DIAGNOSIS — M25642 Stiffness of left hand, not elsewhere classified: Secondary | ICD-10-CM | POA: Diagnosis not present

## 2020-09-09 DIAGNOSIS — J3081 Allergic rhinitis due to animal (cat) (dog) hair and dander: Secondary | ICD-10-CM | POA: Diagnosis not present

## 2020-09-09 DIAGNOSIS — M79642 Pain in left hand: Secondary | ICD-10-CM | POA: Diagnosis not present

## 2020-09-09 DIAGNOSIS — M79641 Pain in right hand: Secondary | ICD-10-CM | POA: Diagnosis not present

## 2020-09-09 DIAGNOSIS — M25641 Stiffness of right hand, not elsewhere classified: Secondary | ICD-10-CM | POA: Diagnosis not present

## 2020-09-09 DIAGNOSIS — J301 Allergic rhinitis due to pollen: Secondary | ICD-10-CM | POA: Diagnosis not present

## 2020-09-11 DIAGNOSIS — J3089 Other allergic rhinitis: Secondary | ICD-10-CM | POA: Diagnosis not present

## 2020-09-11 DIAGNOSIS — J3081 Allergic rhinitis due to animal (cat) (dog) hair and dander: Secondary | ICD-10-CM | POA: Diagnosis not present

## 2020-09-11 DIAGNOSIS — J301 Allergic rhinitis due to pollen: Secondary | ICD-10-CM | POA: Diagnosis not present

## 2020-09-17 ENCOUNTER — Encounter: Payer: Self-pay | Admitting: Psychiatry

## 2020-09-17 ENCOUNTER — Ambulatory Visit (INDEPENDENT_AMBULATORY_CARE_PROVIDER_SITE_OTHER): Payer: Medicare Other | Admitting: Psychiatry

## 2020-09-17 ENCOUNTER — Other Ambulatory Visit: Payer: Self-pay

## 2020-09-17 DIAGNOSIS — G4710 Hypersomnia, unspecified: Secondary | ICD-10-CM

## 2020-09-17 DIAGNOSIS — F331 Major depressive disorder, recurrent, moderate: Secondary | ICD-10-CM | POA: Diagnosis not present

## 2020-09-17 DIAGNOSIS — F411 Generalized anxiety disorder: Secondary | ICD-10-CM | POA: Diagnosis not present

## 2020-09-17 DIAGNOSIS — F4010 Social phobia, unspecified: Secondary | ICD-10-CM | POA: Diagnosis not present

## 2020-09-17 DIAGNOSIS — F900 Attention-deficit hyperactivity disorder, predominantly inattentive type: Secondary | ICD-10-CM

## 2020-09-17 NOTE — Progress Notes (Signed)
[name] [id number] May 01, 1950 70 y.o.   Subjective:   Patient ID:  Lauren Bradley is a 70 y.o. (DOB 02-03-1950) female.  Chief Complaint:  Chief Complaint  Patient presents with   Follow-up   Depression   ADHD   Anxiety   Fatigue   Stress    Depression        Associated symptoms include fatigue, myalgias and headaches.  Associated symptoms include no decreased concentration and no suicidal ideas. Lauren Bradley presents  today for follow-up of depression, anxiety and insomnia and chronic pain.   At visit Jun 08, 2018.  No major meds were changed except she was trying to gradually increase the Abilify to 2 mg daily for optimal mood effect.  I feel much better on the Abilify.  Makes my brain feel more normal and focus is better and calmer overall.   visit August 31, 2018.  She had asked to increase Abilify from 2 to 3 mg daily.  She had seen Abilify benefit at 2 mg but wished for additional benefit for mood and anxiety.  visit November 2020.  The following was noted: Has done well on Abilify 3 mg with additional improvement in mood, but still has a lot of anxiety.  [name] noticed the difference in her mood.  Managed the weight and lost to baseline.  Wonders about the increase to 5 mg to help anxiety.  Benefit for energy which is reasonable with less napping than before Abilify.  Took half Provigil last week when driving distance and did ok with it.   Helped ADHD.  Function is better.  Still some days flounder.  Not as good as paperwork years ago but better with Abilify.  Helped having Lauren Bradley at home.  Was poor function before Abilify despite trying hard. Abilify helped the depression clearly and very beneficial.  Not always sad now.  HA resolved.  Initially it caused great energy like a motor running and reduced need for sleep to 5-6 hours.  That has resolved.  SE weight gain without increasing calories.  Changed her sleep schedule for the better.  To bed earlier and up  earlier.    I didn't realize how depressed I was.  Better organization and function and productivity.  Abilify helped regulate and correct her sleep wake schedule.  Not oversleeping now. Anxiety remains high chronically.  Can talk too much when anxious and her accountant commented on it to her.  QT has been good for her.  Chronic social anxiety.  Can be crippled from anxiety.   Plan: Trial increase Abilify 5 mg daily in hopes of further reducing anxiety and depressive symptoms.  05/31/2019 appointment the following is noted: Sleep well with naltrexone and alprazolam 0.25 mg HS. Better energy and alertness with modafinil 100 but a little less effect over time.  Some degree of tolerance. Hard to get paperwork done. Done well with aripiprazole 5 mg daily. Helped energy and coping with calmer manner. So much less depressed with Abilify.  Concerns over Hgb A1C which had been borderline in the past and is up a little to 5.9 now.  No extra weight at this point.  Is exercising.     Stressed still dealing with hypochondriac mother with constant somatization and complaining and anxiety and frequently wanting to go to the ER.  Better handling it with Abilify.  Disc management with this. Mother compulsively and annoyingly call her repeatedly.  Feels shame over the feelings she has about her mother.  Major boundary issues from her mother.  Mother was neglectful in her childhood.  Recognizes some blacked out memories from her childhood.  Asked how to deal with this with mother.  09/11/19 appt with the following noted: Not quite as well with stress from mother bc B doesn't help much. Concerned about Abilify poss effecting glucose.  Did have high AIC in past even before Abilify.  Has to watch carbs.  Plans to see PCP Dr. [name] again about it. Poor heat tolerance this year.  Wonders if related to modafinil.  Went up to 150 mg and didn't notice a lot of issues.   No SE otherwise.  Is able to do some paperwork more  effectively lately.  More pain intolerant as well. No problems with sleep affected by modafinil.  2 dogs sleep with her.  Cough can wake her worse off tramadol.   Lost her dog to pancreatic CA.  Good help with homeopathic vet.  Feels guilty he had fever and she didn't realize it at the time. Plan: no med changes.  Continue Abilfy 5 which helped and increased modafinil to 150. And other meds.  11/21/2019 appointment with the following noted: Stress care of mother and needs help.  Wonders about moving mother in to help financially.  House needs work. Does everything for mother now.    B's son shot himself at her mother's home. Doesn't feel B respects mother and it causes problems. Realized big issues wiith ExH.  [name]'s graduation brought up some of this she thought she had addressed. Plan: no med changes  01/25/2020 appointment with the following noted: Modafinil 150 mg helped daytime sleepiness.   Sleep has been reset somewhat to a more typical sleep pattern.  Able to stay awake driving better now with modafinil.  Abilify helped energy and reset body clock.   [name] is still a handful.  When not deeply depressed she wants to shop.  Focus hard for mother.  Also concerned about paranoia issues with mother. Angst about dx of new skin diagnosis.   Stress ExH with someone known for a long time and upsetting.  Lost 10# over that.  4-5# underweight.   Also uncertainty about whether or not to get a job.  M is very picky eater and difficult to prepare food for her.  M is very time demanding and loses things.   Pt dx with PXE.  Plan further improvement was noted with Abilify increased to 5 mg daily so no med changes today  04/24/2020 appointment with following noted: [name] comes to appt next week.   Disc boundary setting with mother who can be overwhelming. Disc Questions around Chronic viral illness complications.  She got better with diet changes and weight lifting. Worked on CBT to deal with negative  thinking and mother.  Talks with her daily. Hardd to deal with [name]'s GF. Plan no med changes  06/25/2020 appointment with the following noted: Disc concerns about Covid vaccine and questions.  A lot of fatigue. Exercise a lot more.   Stress alimony drops August 1 and worry.  Also with drop in stock market. Causing anxiety.   Gets 7 hours sleep but weekends 8-9 hours.   Keep wanting to wean Xanax.  Disc concerns. Disc relationship concerns around Ex. Not shakey or jittery. Exercise twice daily.  Busy all day. Plan: Increase modafinil 150 mg and 50 mg Noon for hypersomnolence and ADD.    09/17/2020 appt noted: Stress over alimony ending. Some recennt conflict with Ex  and son getting drug into the middle of it.  Angry email from Ex H.  Wants advice about how to deal with Lauren Bradley and respond to the situation.  Extensive discussion over stressors related to family.  Wants to decide to respond in manner that does not hurt her interests. Patient reports stable mood and denies depressed or irritable moods.  .  Patient denies difficulty with sleep initiation or maintenance. Denies appetite disturbance.  Patient reports that energy and motivation have been good.  Patient denies any difficulty with concentration.  Patient denies any suicidal ideation.    Some social anxiety still significant.  Past Psychiatric Medication Trials: Abilify 3 mg helpful,  duloxetine 90, venlafaxine irritable, buspirone side effects, trycyclic antidepressants, Wellbutrin, Zoloft, Paxil, Modafinil 150, Vyvanse 30, Ritalin, Adderall,   propranolol Belsomra, Lunesta, Ambien, trazodone, alprazolam, doxepin, mirtazapine, clonazepam  Review of Systems:  Review of Systems  Constitutional:  Positive for fatigue.  Respiratory:  Positive for cough. Negative for shortness of breath.   Cardiovascular:  Negative for palpitations.  Musculoskeletal:  Positive for arthralgias, back pain and myalgias. Negative for gait problem.   Skin:  Positive for rash.  Neurological:  Positive for headaches. Negative for dizziness, tremors and weakness.  Psychiatric/Behavioral:  Positive for depression. Negative for agitation, behavioral problems, confusion, decreased concentration, dysphoric mood, hallucinations, self-injury, sleep disturbance and suicidal ideas. The patient is nervous/anxious. The patient is not hyperactive.    Medications: I have reviewed the patient's current medications.  Current Outpatient Medications  Medication Sig Dispense Refill   ALPRAZolam (XANAX) 0.5 MG tablet TAKE 2 TABLETS BY MOUTH EVERY NIGHT AT BEDTIME AND 1 TABLET EVERY DAY AS NEEDED (Patient taking differently: Take 0.25 mg by mouth at bedtime. TAKE 2 TABLETS BY MOUTH EVERY NIGHT AT BEDTIME AND 1 TABLET EVERY DAY AS NEEDED) 75 tablet 0   ARIPiprazole (ABILIFY) 5 MG tablet TAKE ONE TABLET BY MOUTH ONCE DAILY 90 tablet 0   azelastine (ASTELIN) 0.1 % nasal spray      calcium carbonate (OS-CAL) 600 MG TABS tablet Take 600 mg by mouth.     DULoxetine (CYMBALTA) 30 MG capsule TAKE ONE CAPSULE BY MOUTH ONCE DAILY WITH one 60 MG capsule 90 capsule 1   DULoxetine (CYMBALTA) 60 MG capsule TAKE ONE CAPSULE BY MOUTH ONCE DAILY WITH one 30 MG capsule 90 capsule 1   estradiol (VIVELLE-DOT) 0.075 MG/24HR estradiol 0.075 mg/24 hr semiweekly transdermal patch  APPLY 1 PATCH TO SKIN TWICE WEEKLY AS DIRECTED     fexofenadine (ALLEGRA) 180 MG tablet Take 180 mg by mouth daily.     Levomefolate Glucosamine (METHYLFOLATE PO) Take by mouth.     MAGNESIUM MALATE PO Take by mouth daily.     modafinil (PROVIGIL) 100 MG tablet TAKE 1 AND 1/2 TABLETS BY MOUTH EVERY MORNING and TAKE 1/2 TABLET BY MOUTH AT NOON 60 tablet 1   NP THYROID 15 MG tablet      progesterone (PROMETRIUM) 200 MG capsule Take by mouth.     sodium fluoride (FLUORISHIELD) 1.1 % GEL dental gel SF 5000 Plus 1.1 % dental cream     SUMAtriptan (IMITREX) 100 MG tablet as needed.     thyroid (ARMOUR) 90 MG  tablet TAKE 1 TABLET BY MOUTH EVERY DAY ON AN EMPTY STOMACH WITH 15 MG DOSE     Vitamin D, Ergocalciferol, (DRISDOL) 50000 units CAPS capsule Take 50,000 Units by mouth every 7 (seven) days.      EPINEPHrine (EPIPEN JR) 0.15 MG/0.3ML injection  (Patient  not taking: Reported on 09/17/2020)     No current facility-administered medications for this visit.    Medication Side Effects: HA stopped with Abilify, weight gain, hungry is odd  Allergies:  Allergies  Allergen Reactions   Hydrocodone Itching   Codeine Nausea And Vomiting   Other    Sulfa Antibiotics    Cephalosporins Rash   Sulfamethoxazole Rash    Unknown reaction    Past Medical History:  Diagnosis Date   Asthma    DDD (degenerative disc disease), cervical    DDD (degenerative disc disease), lumbar    Fatigue    Fibromyalgia    Infertility, female    Osteoarthritis     Family History  Problem Relation Age of Onset   Fibromyalgia Mother    Kidney failure Mother    Mental illness Mother    Heart disease Father    COPD Father    Cancer Father        colon, liver    Hypothyroidism Son     Social History   Socioeconomic History   Marital status: Single    Spouse name: Not on file   Number of children: Not on file   Years of education: Not on file   Highest education level: Not on file  Occupational History   Not on file  Tobacco Use   Smoking status: Never   Smokeless tobacco: Never  Vaping Use   Vaping Use: Never used  Substance and Sexual Activity   Alcohol use: Not Currently   Drug use: No   Sexual activity: Not on file  Other Topics Concern   Not on file  Social History Narrative   Not on file   Social Determinants of Health   Financial Resource Strain: Not on file  Food Insecurity: Not on file  Transportation Needs: Not on file  Physical Activity: Not on file  Stress: Not on file  Social Connections: Not on file  Intimate Partner Violence: Not on file    Past Medical History, Surgical  history, Social history, and Family history were reviewed and updated as appropriate.   Please see review of systems for further details on the patient's review from today.   Objective:   Physical Exam:  There were no vitals taken for this visit.  Physical Exam Constitutional:      General: She is not in acute distress. Musculoskeletal:        General: No deformity.  Neurological:     Mental Status: She is alert and oriented to person, place, and time.     Cranial Nerves: No dysarthria.     Coordination: Coordination normal.  Psychiatric:        Attention and Perception: Attention and perception normal. She does not perceive auditory or visual hallucinations.        Mood and Affect: Mood is anxious. Mood is not depressed. Affect is not labile, blunt, angry, tearful or inappropriate.        Speech: Speech normal. Speech is not slurred.        Behavior: Behavior normal. Behavior is cooperative.        Thought Content: Thought content normal. Thought content is not paranoid or delusional. Thought content does not include homicidal or suicidal ideation. Thought content does not include homicidal or suicidal plan.        Cognition and Memory: Cognition and memory normal.        Judgment: Judgment normal.     Comments: Insight fair to good.  Depression has almost resolved with the Abilify for the most part.  Anxiety remains moderate worse with relational issues eith Ex     Lab Review:     Component Value Date/Time   NA 137 02/21/2017 1635   K 4.3 02/21/2017 1635   CL 102 02/21/2017 1635   CO2 29 02/21/2017 1635   GLUCOSE 88 02/21/2017 1635   BUN 16 02/21/2017 1635   CREATININE 0.58 02/21/2017 1635   CALCIUM 9.4 02/21/2017 1635   PROT 6.8 02/21/2017 1635   ALBUMIN 4.4 12/30/2015 1507   AST 22 02/21/2017 1635   ALT 20 02/21/2017 1635   ALKPHOS 28 (L) 12/30/2015 1507   BILITOT 0.3 02/21/2017 1635   GFRNONAA 97 02/21/2017 1635   GFRAA 112 02/21/2017 1635       Component Value  Date/Time   WBC 6.6 02/21/2017 1635   RBC 4.36 02/21/2017 1635   HGB 12.9 02/21/2017 1635   HCT 38.2 02/21/2017 1635   PLT 327 02/21/2017 1635   MCV 87.6 02/21/2017 1635   MCH 29.6 02/21/2017 1635   MCHC 33.8 02/21/2017 1635   RDW 12.4 02/21/2017 1635   LYMPHSABS 1,228 02/21/2017 1635   MONOABS 402 12/30/2015 1507   EOSABS 132 02/21/2017 1635   BASOSABS 73 02/21/2017 1635    No results found for: POCLITH, LITHIUM   No results found for: PHENYTOIN, PHENOBARB, VALPROATE, CBMZ   .res Assessment: Plan:    Major depressive disorder, recurrent episode, moderate (HCC)  Generalized anxiety disorder  Social anxiety disorder  Attention deficit hyperactivity disorder (ADHD), predominantly inattentive type  Hypersomnolence disorder, persistent   Less depressed with the Abilify clear benefit for depression and anxiety.   It's helped productivity and mood and energy and less hypersomnolence. Further improvement with increase Abilify to 5 mg daily.  Disc glucose and A1c again.Marland Kitchen    Option check labs and Hgb A1C bc in past was prediabetic. She's using buffered berberine.  She'd like to hold off on metformin.  We discussed the short-term risks associated with benzodiazepines including sedation and increased fall risk among others.  Discussed long-term side effect risk including dependence, potential withdrawal symptoms, and the potential eventual dose-related risk of dementia.  But recent studies from 2020 dispute this association between benzodiazepines and dementia risk. Newer studies in 2020 do not support an association with dementia. Extensive discussion of it.   She was successful at reducing Xanax for sleep.  Discussed potential metabolic side effects associated with atypical antipsychotics, as well as potential risk for movement side effects. Advised pt to contact office if movement side effects occur.   Later could consider reducing duloxetine for cost reasons. Continue duloxetine  90.Marland Kitchen  Disc risk sweating  And heat intolerance with it.  Answered questions about tramadol with asthma cough. SE.   Continue modafinil 150 mg and 50 mg Noon for hypersomnolence and ADD.  She doesn't tolerate traditional stimulants like Adderall and MPH..She has gotten benefit so far with it.  Disc pros and cons of increasing.  Protect sleep and try to get more if possible.    Modafinil and Abilify have corrected delayed sleep phase disorder.  No med changes today.  Problem solving therapy and supportive interventions around dealing with her ex-husband and her son and other relationships between them.  This is particularly complicated because her son lives with her but work works for her ex-husband.  There has been some recent hostile contact tact from her ex which is caused additional stress.  She wanted discussion  about how to respond to the situation.  This was discussed at some length.  45 min appt  FU 3 mos  Lynder Parents, MD, DFAPA   Please see After Visit Summary for patient specific instructions.  Future Appointments  Date Time Provider [place]  11/24/2020  2:00 PM Cottle, Billey Co., MD [facility] None  06/23/2021  2:30 PM [name], MD [facility] None    No orders of the defined types were placed in this encounter.     -------------------------------

## 2020-09-19 DIAGNOSIS — J3089 Other allergic rhinitis: Secondary | ICD-10-CM | POA: Diagnosis not present

## 2020-09-19 DIAGNOSIS — J3081 Allergic rhinitis due to animal (cat) (dog) hair and dander: Secondary | ICD-10-CM | POA: Diagnosis not present

## 2020-09-19 DIAGNOSIS — J301 Allergic rhinitis due to pollen: Secondary | ICD-10-CM | POA: Diagnosis not present

## 2020-09-22 DIAGNOSIS — S90112A Contusion of left great toe without damage to nail, initial encounter: Secondary | ICD-10-CM | POA: Diagnosis not present

## 2020-09-22 DIAGNOSIS — M722 Plantar fascial fibromatosis: Secondary | ICD-10-CM | POA: Diagnosis not present

## 2020-09-23 DIAGNOSIS — M25632 Stiffness of left wrist, not elsewhere classified: Secondary | ICD-10-CM | POA: Diagnosis not present

## 2020-09-23 DIAGNOSIS — M25641 Stiffness of right hand, not elsewhere classified: Secondary | ICD-10-CM | POA: Diagnosis not present

## 2020-09-23 DIAGNOSIS — M79642 Pain in left hand: Secondary | ICD-10-CM | POA: Diagnosis not present

## 2020-09-23 DIAGNOSIS — M79641 Pain in right hand: Secondary | ICD-10-CM | POA: Diagnosis not present

## 2020-09-23 DIAGNOSIS — M25631 Stiffness of right wrist, not elsewhere classified: Secondary | ICD-10-CM | POA: Diagnosis not present

## 2020-09-23 DIAGNOSIS — M25642 Stiffness of left hand, not elsewhere classified: Secondary | ICD-10-CM | POA: Diagnosis not present

## 2020-09-23 DIAGNOSIS — R29898 Other symptoms and signs involving the musculoskeletal system: Secondary | ICD-10-CM | POA: Diagnosis not present

## 2020-10-02 DIAGNOSIS — J301 Allergic rhinitis due to pollen: Secondary | ICD-10-CM | POA: Diagnosis not present

## 2020-10-02 DIAGNOSIS — J3089 Other allergic rhinitis: Secondary | ICD-10-CM | POA: Diagnosis not present

## 2020-10-07 DIAGNOSIS — N898 Other specified noninflammatory disorders of vagina: Secondary | ICD-10-CM | POA: Diagnosis not present

## 2020-10-07 DIAGNOSIS — R3915 Urgency of urination: Secondary | ICD-10-CM | POA: Diagnosis not present

## 2020-10-07 DIAGNOSIS — J301 Allergic rhinitis due to pollen: Secondary | ICD-10-CM | POA: Diagnosis not present

## 2020-10-07 DIAGNOSIS — J3081 Allergic rhinitis due to animal (cat) (dog) hair and dander: Secondary | ICD-10-CM | POA: Diagnosis not present

## 2020-10-07 DIAGNOSIS — R32 Unspecified urinary incontinence: Secondary | ICD-10-CM | POA: Diagnosis not present

## 2020-10-07 DIAGNOSIS — J3089 Other allergic rhinitis: Secondary | ICD-10-CM | POA: Diagnosis not present

## 2020-10-19 ENCOUNTER — Other Ambulatory Visit: Payer: Self-pay | Admitting: Psychiatry

## 2020-10-19 DIAGNOSIS — G4710 Hypersomnia, unspecified: Secondary | ICD-10-CM

## 2020-10-19 DIAGNOSIS — F900 Attention-deficit hyperactivity disorder, predominantly inattentive type: Secondary | ICD-10-CM

## 2020-10-22 DIAGNOSIS — M62838 Other muscle spasm: Secondary | ICD-10-CM | POA: Diagnosis not present

## 2020-10-22 DIAGNOSIS — J3089 Other allergic rhinitis: Secondary | ICD-10-CM | POA: Diagnosis not present

## 2020-10-22 DIAGNOSIS — J301 Allergic rhinitis due to pollen: Secondary | ICD-10-CM | POA: Diagnosis not present

## 2020-10-22 DIAGNOSIS — M6289 Other specified disorders of muscle: Secondary | ICD-10-CM | POA: Diagnosis not present

## 2020-10-22 DIAGNOSIS — N3946 Mixed incontinence: Secondary | ICD-10-CM | POA: Diagnosis not present

## 2020-10-22 DIAGNOSIS — M6281 Muscle weakness (generalized): Secondary | ICD-10-CM | POA: Diagnosis not present

## 2020-10-22 DIAGNOSIS — J3081 Allergic rhinitis due to animal (cat) (dog) hair and dander: Secondary | ICD-10-CM | POA: Diagnosis not present

## 2020-11-05 DIAGNOSIS — J45991 Cough variant asthma: Secondary | ICD-10-CM | POA: Diagnosis not present

## 2020-11-05 DIAGNOSIS — M6281 Muscle weakness (generalized): Secondary | ICD-10-CM | POA: Diagnosis not present

## 2020-11-05 DIAGNOSIS — N3946 Mixed incontinence: Secondary | ICD-10-CM | POA: Diagnosis not present

## 2020-11-05 DIAGNOSIS — J3081 Allergic rhinitis due to animal (cat) (dog) hair and dander: Secondary | ICD-10-CM | POA: Diagnosis not present

## 2020-11-05 DIAGNOSIS — J3089 Other allergic rhinitis: Secondary | ICD-10-CM | POA: Diagnosis not present

## 2020-11-05 DIAGNOSIS — J301 Allergic rhinitis due to pollen: Secondary | ICD-10-CM | POA: Diagnosis not present

## 2020-11-05 DIAGNOSIS — M6289 Other specified disorders of muscle: Secondary | ICD-10-CM | POA: Diagnosis not present

## 2020-11-05 DIAGNOSIS — M62838 Other muscle spasm: Secondary | ICD-10-CM | POA: Diagnosis not present

## 2020-11-06 ENCOUNTER — Other Ambulatory Visit: Payer: Self-pay | Admitting: Psychiatry

## 2020-11-06 DIAGNOSIS — F331 Major depressive disorder, recurrent, moderate: Secondary | ICD-10-CM

## 2020-11-12 DIAGNOSIS — Z23 Encounter for immunization: Secondary | ICD-10-CM | POA: Diagnosis not present

## 2020-11-18 ENCOUNTER — Ambulatory Visit: Payer: Medicare Other | Admitting: Psychiatry

## 2020-11-19 ENCOUNTER — Other Ambulatory Visit: Payer: Self-pay | Admitting: Psychiatry

## 2020-11-19 NOTE — Telephone Encounter (Signed)
Last filled 04/30/20 appt on 11/14

## 2020-11-20 DIAGNOSIS — J3081 Allergic rhinitis due to animal (cat) (dog) hair and dander: Secondary | ICD-10-CM | POA: Diagnosis not present

## 2020-11-20 DIAGNOSIS — Z01419 Encounter for gynecological examination (general) (routine) without abnormal findings: Secondary | ICD-10-CM | POA: Diagnosis not present

## 2020-11-20 DIAGNOSIS — Z681 Body mass index (BMI) 19 or less, adult: Secondary | ICD-10-CM | POA: Diagnosis not present

## 2020-11-20 DIAGNOSIS — Z1231 Encounter for screening mammogram for malignant neoplasm of breast: Secondary | ICD-10-CM | POA: Diagnosis not present

## 2020-11-20 DIAGNOSIS — Z779 Other contact with and (suspected) exposures hazardous to health: Secondary | ICD-10-CM | POA: Diagnosis not present

## 2020-11-20 DIAGNOSIS — J3089 Other allergic rhinitis: Secondary | ICD-10-CM | POA: Diagnosis not present

## 2020-11-24 ENCOUNTER — Other Ambulatory Visit: Payer: Self-pay

## 2020-11-24 ENCOUNTER — Encounter: Payer: Self-pay | Admitting: Psychiatry

## 2020-11-24 ENCOUNTER — Ambulatory Visit (INDEPENDENT_AMBULATORY_CARE_PROVIDER_SITE_OTHER): Payer: Medicare Other | Admitting: Psychiatry

## 2020-11-24 DIAGNOSIS — G4710 Hypersomnia, unspecified: Secondary | ICD-10-CM | POA: Diagnosis not present

## 2020-11-24 DIAGNOSIS — F411 Generalized anxiety disorder: Secondary | ICD-10-CM

## 2020-11-24 DIAGNOSIS — F331 Major depressive disorder, recurrent, moderate: Secondary | ICD-10-CM | POA: Diagnosis not present

## 2020-11-24 DIAGNOSIS — F900 Attention-deficit hyperactivity disorder, predominantly inattentive type: Secondary | ICD-10-CM

## 2020-11-24 DIAGNOSIS — F4010 Social phobia, unspecified: Secondary | ICD-10-CM

## 2020-11-24 MED ORDER — LITHIUM CARBONATE 150 MG PO CAPS: 150.0000 mg | ORAL_CAPSULE | Freq: Every day | ORAL | 3 refills | Status: DC

## 2020-11-24 NOTE — Progress Notes (Signed)
Lauren Bradley [id number] [birth year] 70 y.o.   Subjective:   Patient ID:  Lauren Bradley is a 70 y.o. (DOB 07/29/1950) female.  Chief Complaint:  Chief Complaint  Patient presents with   Follow-up   Depression   ADHD    Depression        Associated symptoms include fatigue, myalgias and headaches.  Associated symptoms include no decreased concentration and no suicidal ideas. [name] presents  today for follow-up of depression, anxiety and insomnia and chronic pain.   At visit Jun 08, 2018.  No major meds were changed except she was trying to gradually increase the Abilify to 2 mg daily for optimal mood effect.  I feel much better on the Abilify.  Makes my brain feel more normal and focus is better and calmer overall.   visit August 31, 2018.  She had asked to increase Abilify from 2 to 3 mg daily.  She had seen Abilify benefit at 2 mg but wished for additional benefit for mood and anxiety.  visit November 2020.  The following was noted: Has done well on Abilify 3 mg with additional improvement in mood, but still has a lot of anxiety.  [name] noticed the difference in her mood.  Managed the weight and lost to baseline.  Wonders about the increase to 5 mg to help anxiety.  Benefit for energy which is reasonable with less napping than before Abilify.  Took half Provigil last week when driving distance and did ok with it.   Helped ADHD.  Function is better.  Still some days flounder.  Not as good as paperwork years ago but better with Abilify.  Helped having [name] at home.  Was poor function before Abilify despite trying hard. Abilify helped the depression clearly and very beneficial.  Not always sad now.  HA resolved.  Initially it caused great energy like a motor running and reduced need for sleep to 5-6 hours.  That has resolved.  SE weight gain without increasing calories.  Changed her sleep schedule for the better.  To bed earlier and up earlier.    I didn't realize how  depressed I was.  Better organization and function and productivity.  Abilify helped regulate and correct her sleep wake schedule.  Not oversleeping now. Anxiety remains high chronically.  Can talk too much when anxious and her accountant commented on it to her.  QT has been good for her.  Chronic social anxiety.  Can be crippled from anxiety.   Plan: Trial increase Abilify 5 mg daily in hopes of further reducing anxiety and depressive symptoms.  05/31/2019 appointment the following is noted: Sleep well with naltrexone and alprazolam 0.25 mg HS. Better energy and alertness with modafinil 100 but a little less effect over time.  Some degree of tolerance. Hard to get paperwork done. Done well with aripiprazole 5 mg daily. Helped energy and coping with calmer manner. So much less depressed with Abilify.  Concerns over Hgb A1C which had been borderline in the past and is up a little to 5.9 now.  No extra weight at this point.  Is exercising.     Stressed still dealing with hypochondriac mother with constant somatization and complaining and anxiety and frequently wanting to go to the ER.  Better handling it with Abilify.  Disc management with this. Mother compulsively and annoyingly call her repeatedly.  Feels shame over the feelings she has about her mother.  Major boundary issues from her mother.  Mother  was neglectful in her childhood.  Recognizes some blacked out memories from her childhood.  Asked how to deal with this with mother.  09/11/19 appt with the following noted: Not quite as well with stress from mother bc B doesn't help much. Concerned about Abilify poss effecting glucose.  Did have high AIC in past even before Abilify.  Has to watch carbs.  Plans to see PCP Dr. [name] again about it. Poor heat tolerance this year.  Wonders if related to modafinil.  Went up to 150 mg and didn't notice a lot of issues.   No SE otherwise.  Is able to do some paperwork more effectively lately.  More pain  intolerant as well. No problems with sleep affected by modafinil.  2 dogs sleep with her.  Cough can wake her worse off tramadol.   Lost her dog to pancreatic CA.  Good help with homeopathic vet.  Feels guilty he had fever and she didn't realize it at the time. Plan: no med changes.  Continue Abilfy 5 which helped and increased modafinil to 150. And other meds.  11/21/2019 appointment with the following noted: Stress care of mother and needs help.  Wonders about moving mother in to help financially.  House needs work. Does everything for mother now.    B's son shot himself at her mother's home. Doesn't feel B respects mother and it causes problems. Realized big issues wiith ExH.  [name]'s graduation brought up some of this she thought she had addressed. Plan: no med changes  01/25/2020 appointment with the following noted: Modafinil 150 mg helped daytime sleepiness.   Sleep has been reset somewhat to a more typical sleep pattern.  Able to stay awake driving better now with modafinil.  Abilify helped energy and reset body clock.   Mo is still a handful.  When not deeply depressed she wants to shop.  Focus hard for mother.  Also concerned about paranoia issues with mother. Angst about dx of new skin diagnosis.   Stress ExH with someone known for a long time and upsetting.  Lost 10# over that.  4-5# underweight.   Also uncertainty about whether or not to get a job.  M is very picky eater and difficult to prepare food for her.  M is very time demanding and loses things.   Pt dx with PXE.  Plan further improvement was noted with Abilify increased to 5 mg daily so no med changes today  04/24/2020 appointment with following noted: [name] comes to appt next week.   Disc boundary setting with mother who can be overwhelming. Disc Questions around Chronic viral illness complications.  She got better with diet changes and weight lifting. Worked on CBT to deal with negative thinking and mother.  Talks with  her daily. Hardd to deal with [name]'s GF. Plan no med changes  06/25/2020 appointment with the following noted: Disc concerns about Covid vaccine and questions.  A lot of fatigue. Exercise a lot more.   Stress alimony drops August 1 and worry.  Also with drop in stock market. Causing anxiety.   Gets 7 hours sleep but weekends 8-9 hours.   Keep wanting to wean Xanax.  Disc concerns. Disc relationship concerns around Ex. Not shakey or jittery. Exercise twice daily.  Busy all day. Plan: Increase modafinil 150 mg and 50 mg Noon for hypersomnolence and ADD.    09/17/2020 appt noted: Stress over alimony ending. Some recennt conflict with Ex and son getting drug into the middle of  it.  Angry email from Ex H.  Wants advice about how to deal with [name] and respond to the situation.  Extensive discussion over stressors related to family.  Wants to decide to respond in manner that does not hurt her interests. Patient reports stable mood and denies depressed or irritable moods.  .  Patient denies difficulty with sleep initiation or maintenance. Denies appetite disturbance.  Patient reports that energy and motivation have been good.  Patient denies any difficulty with concentration.  Patient denies any suicidal ideation.  11/24/20 appt noted; Botox helps migraine. But worse lately. Poor memory concerning.  Worrying over alimony drop in Sept.   On Abilify 5, duloxetine 90, modafinil 150, Xanax 0.5 mg 2 at night and 1 prn anxiety. Disc concerns over's [name]'s confidence level. Mood is OK.  Does have lunch with friends monthly. Stress with needy mother who calls constantly.   Some social anxiety still significant.  Past Psychiatric Medication Trials: Abilify 3 mg helpful,  duloxetine 90, venlafaxine irritable, buspirone side effects, trycyclic antidepressants, Wellbutrin, Zoloft, Paxil, Modafinil 150, Vyvanse 30, Ritalin, Adderall,   propranolol Belsomra, Lunesta, Ambien, trazodone, alprazolam,  doxepin, mirtazapine, clonazepam  Review of Systems:  Review of Systems  Constitutional:  Positive for fatigue.  Respiratory:  Positive for cough. Negative for shortness of breath.   Cardiovascular:  Negative for palpitations.  Musculoskeletal:  Positive for arthralgias, back pain and myalgias. Negative for gait problem.  Skin:  Positive for rash.  Neurological:  Positive for headaches. Negative for dizziness, tremors and weakness.  Psychiatric/Behavioral:  Negative for agitation, behavioral problems, confusion, decreased concentration, dysphoric mood, hallucinations, self-injury, sleep disturbance and suicidal ideas. The patient is nervous/anxious. The patient is not hyperactive.    Medications: I have reviewed the patient's current medications.  Current Outpatient Medications  Medication Sig Dispense Refill   ALPRAZolam (XANAX) 0.5 MG tablet TAKE 2 TABLETS BY MOUTH EVERY NIGHT AT BEDTIME AND 1 TABLET EVERY DAY AS NEEDED 75 tablet 2   ARIPiprazole (ABILIFY) 5 MG tablet TAKE ONE TABLET BY MOUTH ONCE DAILY 30 tablet 0   azelastine (ASTELIN) 0.1 % nasal spray      calcium carbonate (OS-CAL) 600 MG TABS tablet Take 600 mg by mouth.     DULoxetine (CYMBALTA) 30 MG capsule TAKE ONE CAPSULE BY MOUTH ONCE DAILY WITH one 60 MG capsule 90 capsule 1   DULoxetine (CYMBALTA) 60 MG capsule TAKE ONE CAPSULE BY MOUTH ONCE DAILY WITH one 30 MG capsule 90 capsule 1   estradiol (VIVELLE-DOT) 0.075 MG/24HR estradiol 0.075 mg/24 hr semiweekly transdermal patch  APPLY 1 PATCH TO SKIN TWICE WEEKLY AS DIRECTED     fexofenadine (ALLEGRA) 180 MG tablet Take 180 mg by mouth daily.     Levomefolate Glucosamine (METHYLFOLATE PO) Take by mouth.     lithium carbonate 150 MG capsule Take 1 capsule (150 mg total) by mouth daily. 90 capsule 3   MAGNESIUM MALATE PO Take by mouth daily.     modafinil (PROVIGIL) 100 MG tablet TAKE 1 AND 1/2 TABLETS BY MOUTH EVERY MORNING and TAKE 1/2 TABLET BY MOUTH AT NOON 60 tablet 1    NP THYROID 15 MG tablet      progesterone (PROMETRIUM) 200 MG capsule Take by mouth.     sodium fluoride (FLUORISHIELD) 1.1 % GEL dental gel SF 5000 Plus 1.1 % dental cream     SUMAtriptan (IMITREX) 100 MG tablet as needed.     thyroid (ARMOUR) 90 MG tablet TAKE 1 TABLET BY MOUTH EVERY  DAY ON AN EMPTY STOMACH WITH 15 MG DOSE     Vitamin D, Ergocalciferol, (DRISDOL) 50000 units CAPS capsule Take 50,000 Units by mouth every 7 (seven) days.      EPINEPHrine (EPIPEN JR) 0.15 MG/0.3ML injection  (Patient not taking: No sig reported)     No current facility-administered medications for this visit.    Medication Side Effects: HA stopped with Abilify, weight gain, hungry is odd  Allergies:  Allergies  Allergen Reactions   Hydrocodone Itching   Codeine Nausea And Vomiting   Other    Sulfa Antibiotics    Cephalosporins Rash   Sulfamethoxazole Rash    Unknown reaction    Past Medical History:  Diagnosis Date   Asthma    DDD (degenerative disc disease), cervical    DDD (degenerative disc disease), lumbar    Fatigue    Fibromyalgia    Infertility, female    Osteoarthritis     Family History  Problem Relation Age of Onset   Fibromyalgia Mother    Kidney failure Mother    Mental illness Mother    Heart disease Father    COPD Father    Cancer Father        colon, liver    Hypothyroidism Son     Social History   Socioeconomic History   Marital status: Single    Spouse name: Not on file   Number of children: Not on file   Years of education: Not on file   Highest education level: Not on file  Occupational History   Not on file  Tobacco Use   Smoking status: Never   Smokeless tobacco: Never  Vaping Use   Vaping Use: Never used  Substance and Sexual Activity   Alcohol use: Not Currently   Drug use: No   Sexual activity: Not on file  Other Topics Concern   Not on file  Social History Narrative   Not on file   Social Determinants of Health   Financial Resource  Strain: Not on file  Food Insecurity: Not on file  Transportation Needs: Not on file  Physical Activity: Not on file  Stress: Not on file  Social Connections: Not on file  Intimate Partner Violence: Not on file    Past Medical History, Surgical history, Social history, and Family history were reviewed and updated as appropriate.   Please see review of systems for further details on the patient's review from today.   Objective:   Physical Exam:  There were no vitals taken for this visit.  Physical Exam Constitutional:      General: She is not in acute distress. Musculoskeletal:        General: No deformity.  Neurological:     Mental Status: She is alert and oriented to person, place, and time.     Cranial Nerves: No dysarthria.     Coordination: Coordination normal.  Psychiatric:        Attention and Perception: Attention and perception normal. She does not perceive auditory or visual hallucinations.        Mood and Affect: Mood is anxious. Mood is not depressed. Affect is not labile, blunt, angry, tearful or inappropriate.        Speech: Speech normal. Speech is not slurred.        Behavior: Behavior normal. Behavior is cooperative.        Thought Content: Thought content normal. Thought content is not paranoid or delusional. Thought content does not include homicidal or suicidal  ideation. Thought content does not include homicidal or suicidal plan.        Cognition and Memory: Cognition and memory normal.        Judgment: Judgment normal.     Comments: Insight fair to good.  Depression has almost resolved with the Abilify for the most part.  Anxiety remains moderate worse with relational issues eith Ex     Lab Review:     Component Value Date/Time   NA 137 02/21/2017 1635   K 4.3 02/21/2017 1635   CL 102 02/21/2017 1635   CO2 29 02/21/2017 1635   GLUCOSE 88 02/21/2017 1635   BUN 16 02/21/2017 1635   CREATININE 0.58 02/21/2017 1635   CALCIUM 9.4 02/21/2017 1635   PROT  6.8 02/21/2017 1635   ALBUMIN 4.4 12/30/2015 1507   AST 22 02/21/2017 1635   ALT 20 02/21/2017 1635   ALKPHOS 28 (L) 12/30/2015 1507   BILITOT 0.3 02/21/2017 1635   GFRNONAA 97 02/21/2017 1635   GFRAA 112 02/21/2017 1635       Component Value Date/Time   WBC 6.6 02/21/2017 1635   RBC 4.36 02/21/2017 1635   HGB 12.9 02/21/2017 1635   HCT 38.2 02/21/2017 1635   PLT 327 02/21/2017 1635   MCV 87.6 02/21/2017 1635   MCH 29.6 02/21/2017 1635   MCHC 33.8 02/21/2017 1635   RDW 12.4 02/21/2017 1635   LYMPHSABS 1,228 02/21/2017 1635   MONOABS 402 12/30/2015 1507   EOSABS 132 02/21/2017 1635   BASOSABS 73 02/21/2017 1635    No results found for: POCLITH, LITHIUM   No results found for: PHENYTOIN, PHENOBARB, VALPROATE, CBMZ   .res Assessment: Plan:    Major depressive disorder, recurrent episode, moderate (HCC) - Plan: lithium carbonate 150 MG capsule  Generalized anxiety disorder  Social anxiety disorder  Attention deficit hyperactivity disorder (ADHD), predominantly inattentive type  Hypersomnolence disorder, persistent   Less depressed with the Abilify clear benefit for depression and anxiety.   It's helped productivity and mood and energy and less hypersomnolence. Continue Abilify to 5 mg daily.  Disc glucose and A1c again.Marland Kitchen    Option check labs and Hgb A1C bc in past was prediabetic. She's using buffered berberine.  She'd like to hold off on metformin.  We discussed the short-term risks associated with benzodiazepines including sedation and increased fall risk among others.  Discussed long-term side effect risk including dependence, potential withdrawal symptoms, and the potential eventual dose-related risk of dementia.  But recent studies from 2020 dispute this association between benzodiazepines and dementia risk. Newer studies in 2020 do not support an association with dementia. Extensive discussion of it.   She was successful at reducing Xanax for sleep.  Discussed  potential metabolic side effects associated with atypical antipsychotics, as well as potential risk for movement side effects. Advised pt to contact office if movement side effects occur.   Continue duloxetine 90.Marland Kitchen  Disc risk sweating  And heat intolerance with it.  Answered questions about tramadol with asthma cough. SE.   Continue modafinil 150 mg and 50 mg Noon for hypersomnolence and ADD.  She doesn't tolerate traditional stimulants like Adderall and MPH..She has gotten benefit so far with it.  Disc pros and cons of increasing.  Protect sleep and try to get more if possible.    Modafinil and Abilify have corrected delayed sleep phase disorder.  Lithium 150 mg for neurocognitive protection.  Problem solving therapy and supportive interventions around dealing with her ex-husband and her son and  other relationships between them.  This is particularly complicated because her son lives with her but work works for her ex-husband.  There has been some recent hostile contact tact from her ex which is caused additional stress.  She wanted discussion about how to respond to the situation.  This was discussed at some length.  45 min appt  FU 3 mos  Lynder Parents, MD, DFAPA   Please see After Visit Summary for patient specific instructions.  Future Appointments  Date Time Provider [place]  01/15/2021  1:30 PM Tanda Rockers, MD [facility] None  06/23/2021  2:30 PM Deveshwar, Abel Presto, MD [facility] None    No orders of the defined types were placed in this encounter.     -------------------------------

## 2020-11-25 DIAGNOSIS — G43719 Chronic migraine without aura, intractable, without status migrainosus: Secondary | ICD-10-CM | POA: Diagnosis not present

## 2020-12-02 DIAGNOSIS — M62838 Other muscle spasm: Secondary | ICD-10-CM | POA: Diagnosis not present

## 2020-12-02 DIAGNOSIS — M6289 Other specified disorders of muscle: Secondary | ICD-10-CM | POA: Diagnosis not present

## 2020-12-02 DIAGNOSIS — M6281 Muscle weakness (generalized): Secondary | ICD-10-CM | POA: Diagnosis not present

## 2020-12-02 DIAGNOSIS — N3946 Mixed incontinence: Secondary | ICD-10-CM | POA: Diagnosis not present

## 2020-12-09 DIAGNOSIS — J301 Allergic rhinitis due to pollen: Secondary | ICD-10-CM | POA: Diagnosis not present

## 2020-12-09 DIAGNOSIS — J3089 Other allergic rhinitis: Secondary | ICD-10-CM | POA: Diagnosis not present

## 2020-12-09 DIAGNOSIS — J3081 Allergic rhinitis due to animal (cat) (dog) hair and dander: Secondary | ICD-10-CM | POA: Diagnosis not present

## 2020-12-11 ENCOUNTER — Other Ambulatory Visit: Payer: Self-pay | Admitting: Psychiatry

## 2020-12-11 DIAGNOSIS — M722 Plantar fascial fibromatosis: Secondary | ICD-10-CM | POA: Diagnosis not present

## 2020-12-11 DIAGNOSIS — M25372 Other instability, left ankle: Secondary | ICD-10-CM | POA: Diagnosis not present

## 2020-12-11 DIAGNOSIS — M7742 Metatarsalgia, left foot: Secondary | ICD-10-CM | POA: Diagnosis not present

## 2020-12-11 DIAGNOSIS — F331 Major depressive disorder, recurrent, moderate: Secondary | ICD-10-CM

## 2020-12-11 DIAGNOSIS — M7741 Metatarsalgia, right foot: Secondary | ICD-10-CM | POA: Diagnosis not present

## 2020-12-18 DIAGNOSIS — J3081 Allergic rhinitis due to animal (cat) (dog) hair and dander: Secondary | ICD-10-CM | POA: Diagnosis not present

## 2020-12-18 DIAGNOSIS — J301 Allergic rhinitis due to pollen: Secondary | ICD-10-CM | POA: Diagnosis not present

## 2020-12-18 DIAGNOSIS — J3089 Other allergic rhinitis: Secondary | ICD-10-CM | POA: Diagnosis not present

## 2020-12-30 DIAGNOSIS — J3089 Other allergic rhinitis: Secondary | ICD-10-CM | POA: Diagnosis not present

## 2020-12-30 DIAGNOSIS — J3081 Allergic rhinitis due to animal (cat) (dog) hair and dander: Secondary | ICD-10-CM | POA: Diagnosis not present

## 2020-12-30 DIAGNOSIS — J301 Allergic rhinitis due to pollen: Secondary | ICD-10-CM | POA: Diagnosis not present

## 2021-01-01 ENCOUNTER — Other Ambulatory Visit: Payer: Self-pay | Admitting: Psychiatry

## 2021-01-01 DIAGNOSIS — F331 Major depressive disorder, recurrent, moderate: Secondary | ICD-10-CM

## 2021-01-01 DIAGNOSIS — F4010 Social phobia, unspecified: Secondary | ICD-10-CM

## 2021-01-01 DIAGNOSIS — F411 Generalized anxiety disorder: Secondary | ICD-10-CM

## 2021-01-04 ENCOUNTER — Other Ambulatory Visit: Payer: Self-pay | Admitting: Psychiatry

## 2021-01-04 DIAGNOSIS — F900 Attention-deficit hyperactivity disorder, predominantly inattentive type: Secondary | ICD-10-CM

## 2021-01-04 DIAGNOSIS — G4710 Hypersomnia, unspecified: Secondary | ICD-10-CM

## 2021-01-15 ENCOUNTER — Encounter: Payer: Self-pay | Admitting: Internal Medicine

## 2021-01-15 ENCOUNTER — Ambulatory Visit (INDEPENDENT_AMBULATORY_CARE_PROVIDER_SITE_OTHER): Payer: Medicare Other

## 2021-01-15 ENCOUNTER — Ambulatory Visit (INDEPENDENT_AMBULATORY_CARE_PROVIDER_SITE_OTHER): Payer: Medicare Other | Admitting: Internal Medicine

## 2021-01-15 ENCOUNTER — Other Ambulatory Visit: Payer: Self-pay

## 2021-01-15 DIAGNOSIS — G478 Other sleep disorders: Secondary | ICD-10-CM

## 2021-01-15 DIAGNOSIS — R059 Cough, unspecified: Secondary | ICD-10-CM | POA: Diagnosis not present

## 2021-01-15 DIAGNOSIS — R058 Other specified cough: Secondary | ICD-10-CM

## 2021-01-15 MED ORDER — PREDNISONE 10 MG PO TABS: ORAL_TABLET | ORAL | 0 refills | Status: DC

## 2021-01-15 MED ORDER — PANTOPRAZOLE SODIUM 40 MG PO TBEC: 40.0000 mg | DELAYED_RELEASE_TABLET | Freq: Every day | ORAL | 2 refills | Status: DC

## 2021-01-15 MED ORDER — FAMOTIDINE 20 MG PO TABS: ORAL_TABLET | ORAL | 11 refills | Status: DC

## 2021-01-15 MED ORDER — TRAMADOL HCL 50 MG PO TABS: 50.0000 mg | ORAL_TABLET | ORAL | 0 refills | Status: AC | PRN

## 2021-01-15 NOTE — Progress Notes (Signed)
Lauren Bradley, female    DOB: 1950-03-17,   MRN: [id number]   Brief patient profile:  72 yowf referred to pulmonary clinic 01/15/2021 by Dr Lavone Orn for chronic "cough since birth"      History of Present Illness  01/15/2021  Pulmonary/ 1st office eval/Lauren Bradley  Chief Complaint  Patient presents with   Pulmonary Consult    Referred by [name]. Pt c/o cough for many years, was controlled for a while on tramadol but this has stopped working for her. Her cough is mainly non prod.   Dyspnea:  just when cough / has had vcd dx by ent Carol Ada  Cough: daily dry cough dates back to childhood where exposed to father's cough tends to be worse late summer fall worse wakes her from  sound asleep  Sleep: bed is flat one pillow  SABA use: ventolin  Peppermit makes her cough  Prednisone helps the cough but not sure it really helps unless also takes tramadol   No obvious other triggers or patterns in  day to day or daytime variability or assoc excess/ purulent sputum or mucus plugs or hemoptysis or cp or chest tightness, subjective wheeze or overt sinus or hb symptoms.     Also denies any obvious fluctuation of symptoms with weather or environmental changes or other aggravating or alleviating factors except as outlined above   No unusual exposure hx or h/o childhood pna/ asthma or knowledge of premature birth.  Current Allergies, Complete Past Medical History, Past Surgical History, Family History, and Social History were reviewed in [employer] record.  ROS  The following are not active complaints unless bolded Hoarseness, sore throat, dysphagia with globus sensation, dental problems, itching, sneezing,  nasal congestion or discharge of excess mucus or purulent secretions, ear ache,   fever, chills, sweats, unintended wt loss or wt gain, classically pleuritic or exertional cp,  orthopnea pnd or arm/hand swelling  or leg swelling, presyncope, palpitations, abdominal  pain, anorexia, nausea, vomiting, diarrhea  or change in bowel habits or change in bladder habits, change in stools or change in urine, dysuria, hematuria,  rash, arthralgias, visual complaints, headache, numbness, weakness or ataxia or problems with walking or coordination,  change in mood or  memory.               Past Medical History:  Diagnosis Date   Asthma    DDD (degenerative disc disease), cervical    DDD (degenerative disc disease), lumbar    Fatigue    Fibromyalgia    Infertility, female    Osteoarthritis     Outpatient Medications Prior to Visit  Medication Sig Dispense Refill   albuterol (VENTOLIN HFA) 108 (90 Base) MCG/ACT inhaler Inhale 2 puffs into the lungs every 6 (six) hours as needed for wheezing or shortness of breath.     ALPRAZolam (XANAX) 0.5 MG tablet TAKE 2 TABLETS BY MOUTH EVERY NIGHT AT BEDTIME AND 1 TABLET EVERY DAY AS NEEDED 75 tablet 2   ARIPiprazole (ABILIFY) 5 MG tablet TAKE ONE TABLET BY MOUTH ONCE DAILY 90 tablet 0   azelastine (ASTELIN) 0.1 % nasal spray      calcium carbonate (OS-CAL) 600 MG TABS tablet Take 600 mg by mouth.     DULoxetine (CYMBALTA) 30 MG capsule TAKE ONE CAPSULE BY MOUTH ONCE DAILY WITH 60 MG CAPSULE 90 capsule 1   DULoxetine (CYMBALTA) 60 MG capsule TAKE ONE CAPSULE BY MOUTH ONCE DAILY WITH 30 MG CAPSULE 90 capsule  1   EPINEPHrine (EPIPEN JR) 0.15 MG/0.3ML injection      estradiol (VIVELLE-DOT) 0.075 MG/24HR estradiol 0.075 mg/24 hr semiweekly transdermal patch  APPLY 1 PATCH TO SKIN TWICE WEEKLY AS DIRECTED     fexofenadine (ALLEGRA) 180 MG tablet Take 180 mg by mouth daily.     Levomefolate Glucosamine (METHYLFOLATE PO) Take by mouth.     lithium carbonate 150 MG capsule Take 1 capsule (150 mg total) by mouth daily. 90 capsule 3   MAGNESIUM MALATE PO Take by mouth daily.     modafinil (PROVIGIL) 100 MG tablet TAKE 1 AND 1/2 TABLETS BY MOUTH EVERY MORNING and TAKE 1/2 TABLET BY MOUTH AT NOON 60 tablet 1   NP THYROID 15 MG  tablet Take 15 mg by mouth daily.     progesterone (PROMETRIUM) 200 MG capsule Take by mouth.     sodium fluoride (FLUORISHIELD) 1.1 % GEL dental gel SF 5000 Plus 1.1 % dental cream     SUMAtriptan (IMITREX) 100 MG tablet as needed.     thyroid (ARMOUR) 90 MG tablet TAKE 1 TABLET BY MOUTH EVERY DAY ON AN EMPTY STOMACH WITH 15 MG DOSE     Vitamin D, Ergocalciferol, (DRISDOL) 50000 units CAPS capsule Take 50,000 Units by mouth every 7 (seven) days.      No facility-administered medications prior to visit.     Objective:     BP 110/68 (BP Location: Left Arm, Cuff Size: Normal)    Pulse 82    Temp 98.2 F (36.8 C) (Oral)    Ht 5\' 3"  (1.6 m)    Wt 100 lb 9.6 oz (45.6 kg)    SpO2 100% Comment: on RA   BMI 17.82 kg/m   SpO2: 100 % (on RA)  Amb pleasant wf freq throat clearing   HEENT : pt wearing mask not removed for exam due to covid -19 concerns.    NECK :  without JVD/Nodes/TM/ nl carotid upstrokes bilaterally   LUNGS: no acc muscle use,  Nl contour chest which is clear to A and P bilaterally with  cough on insp  maneuvers   CV:  RRR  no s3 or murmur or increase in P2, and no edema   ABD:  soft and nontender with nl inspiratory excursion in the supine position. No bruits or organomegaly appreciated, bowel sounds nl  MS:  Nl gait/ ext warm without deformities, calf tenderness, cyanosis or clubbing No obvious joint restrictions   SKIN: warm and dry without lesions    NEURO:  alert, approp, nl sensorium with  no motor or cerebellar deficits apparent.   CXR PA and Lateral:   01/15/2021 :    I personally reviewed images and agree with radiology impression as follows:   No active cardiopulmonary disease.      Assessment   No problem-specific Assessment & Plan notes found for this encounter.     Christinia Gully, MD 01/17/2021

## 2021-01-15 NOTE — Patient Instructions (Addendum)
The key to effective treatment for your cough is eliminating the non-stop cycle of cough you're stuck in long enough to let your airway heal completely and then see if there is anything still making you cough once you stop the cough suppression, but this should take no more than 5 days to figure out  First take delsym two tsp every 12 hours and supplement if needed with  tramadol 50 mg up to 2 every 4 hours to suppress the urge to cough at all or even clear your throat. Swallowing water or using ice chips/non mint and menthol containing candies (such as lifesavers or sugarless jolly ranchers) are also effective.  You should rest your voice and avoid activities that you know make you cough.  Once you have eliminated the cough for 3 straight days try reducing the tramadol first,  then the delsym as tolerated.    Prednisone 10 mg take  4 each am x 2 days,   2 each am x 2 days,  1 each am x 2 days and stop (this is to eliminate allergies and inflammation from coughing)  Protonix (pantoprazole) Take 30-60 min before first meal of the day and Pepcid 20 mg one bedtime plus chlorpheniramine 4 mg x 1-2 at bedtime (both available over the counter)  until cough is completely gone for at least a week without the need for cough suppression  GERD (REFLUX)  is an extremely common cause of respiratory symptoms, many times with no significant heartburn at all.    It can be treated with medication, but also with lifestyle changes including avoidance of late meals, excessive alcohol, smoking cessation, and avoid fatty foods, chocolate, peppermint, colas, red wine, and acidic juices such as orange juice.  NO MINT OR MENTHOL PRODUCTS SO NO COUGH DROPS  USE HARD CANDY INSTEAD (jolley ranchers or Stover's or Lifesavers (all available in sugarless versions) NO OIL BASED VITAMINS - use powdered substitutes.  Please remember to go to the  x-ray department  for your tests - we will call you with the results when they are  available    Please schedule a follow up office visit in 4 weeks, sooner if needed or return to Dr Joya Gaskins

## 2021-01-17 ENCOUNTER — Encounter: Payer: Self-pay | Admitting: Internal Medicine

## 2021-01-17 NOTE — Assessment & Plan Note (Signed)
Onset at birth per pt -  Dx of VCD by Bettina Gavia at [facility] -  Cyclical cough rx 09/11/7913 >>>   Of the three most common causes of  Sub-acute / recurrent or chronic cough, only one (GERD)  can actually contribute to/ trigger  the other two (asthma and post nasal drip syndrome)  and perpetuate the cylce of cough.  While not intuitively obvious, many patients with chronic low grade reflux do not cough until there is a primary insult that disturbs the protective epithelial barrier and exposes sensitive nerve endings.   This is typically viral but can due to PNDS and  either may apply here.    >>>   The point is that once this occurs, it is difficult to eliminate the cycle  using anything but a maximally effective acid suppression regimen at least in the short run, accompanied by an appropriate diet to address non acid GERD and control / eliminate pnds with 1st gen H1 blockers per guidelines  And  the cough itself for at least 3 days with tramadol but then stop >>> also so added 6 day taper off  Prednisone starting at 40 mg per day in case of component of Th-2 driven upper or lower airways inflammation (if cough responds short term only to relapse before return while will on full rx for uacs (as above), then  that would point to allergic rhinitis/ asthma or eos bronchitis as alternative dx)            Each maintenance medication was reviewed in detail including emphasizing most importantly the difference between maintenance and prns and under what circumstances the prns are to be triggered using an action plan format where appropriate.  Total time for H and P, chart review, counseling,   and generating customized AVS unique to this office visit / same day charting = 45 min

## 2021-01-21 DIAGNOSIS — M6289 Other specified disorders of muscle: Secondary | ICD-10-CM | POA: Diagnosis not present

## 2021-01-21 DIAGNOSIS — N3946 Mixed incontinence: Secondary | ICD-10-CM | POA: Diagnosis not present

## 2021-01-21 DIAGNOSIS — M62838 Other muscle spasm: Secondary | ICD-10-CM | POA: Diagnosis not present

## 2021-01-21 DIAGNOSIS — M6281 Muscle weakness (generalized): Secondary | ICD-10-CM | POA: Diagnosis not present

## 2021-01-26 ENCOUNTER — Telehealth: Payer: Self-pay

## 2021-01-26 NOTE — Telephone Encounter (Signed)
Prior Authorization submitted and approved for MODAFINIL 100 MG  effective 01/11/2021-01/10/2022 PA Case: [id number] with [facility]

## 2021-02-11 ENCOUNTER — Ambulatory Visit: Payer: Medicare Other | Admitting: Psychiatry

## 2021-02-17 DIAGNOSIS — G43719 Chronic migraine without aura, intractable, without status migrainosus: Secondary | ICD-10-CM | POA: Diagnosis not present

## 2021-02-20 ENCOUNTER — Other Ambulatory Visit: Payer: Self-pay | Admitting: Psychiatry

## 2021-02-20 DIAGNOSIS — F331 Major depressive disorder, recurrent, moderate: Secondary | ICD-10-CM

## 2021-03-16 DIAGNOSIS — J3089 Other allergic rhinitis: Secondary | ICD-10-CM | POA: Diagnosis not present

## 2021-03-16 DIAGNOSIS — J301 Allergic rhinitis due to pollen: Secondary | ICD-10-CM | POA: Diagnosis not present

## 2021-03-16 DIAGNOSIS — J3081 Allergic rhinitis due to animal (cat) (dog) hair and dander: Secondary | ICD-10-CM | POA: Diagnosis not present

## 2021-03-17 ENCOUNTER — Other Ambulatory Visit: Payer: Self-pay | Admitting: Psychiatry

## 2021-03-17 DIAGNOSIS — G4710 Hypersomnia, unspecified: Secondary | ICD-10-CM

## 2021-03-17 DIAGNOSIS — F900 Attention-deficit hyperactivity disorder, predominantly inattentive type: Secondary | ICD-10-CM

## 2021-03-18 ENCOUNTER — Ambulatory Visit (INDEPENDENT_AMBULATORY_CARE_PROVIDER_SITE_OTHER): Payer: Medicare Other | Admitting: Psychiatry

## 2021-03-18 ENCOUNTER — Encounter: Payer: Self-pay | Admitting: Psychiatry

## 2021-03-18 ENCOUNTER — Other Ambulatory Visit: Payer: Self-pay

## 2021-03-18 DIAGNOSIS — F411 Generalized anxiety disorder: Secondary | ICD-10-CM

## 2021-03-18 DIAGNOSIS — F331 Major depressive disorder, recurrent, moderate: Secondary | ICD-10-CM | POA: Diagnosis not present

## 2021-03-18 DIAGNOSIS — F900 Attention-deficit hyperactivity disorder, predominantly inattentive type: Secondary | ICD-10-CM | POA: Diagnosis not present

## 2021-03-18 DIAGNOSIS — F4010 Social phobia, unspecified: Secondary | ICD-10-CM

## 2021-03-18 DIAGNOSIS — G4710 Hypersomnia, unspecified: Secondary | ICD-10-CM

## 2021-03-18 MED ORDER — ALPRAZOLAM 0.5 MG PO TABS: ORAL_TABLET | ORAL | 4 refills | Status: DC

## 2021-03-18 MED ORDER — MODAFINIL 100 MG PO TABS: ORAL_TABLET | ORAL | 3 refills | Status: DC

## 2021-03-18 NOTE — Progress Notes (Signed)
Lauren Bradley [id number] 29-Jun-1950 71 y.o.   Subjective:   Patient ID:  Lauren Bradley is a 71 y.o. (DOB 1950/12/28) female.  Chief Complaint:  Chief Complaint  Patient presents with   Follow-up   Depression   ADHD    Depression        Associated symptoms include fatigue, myalgias and headaches.  Associated symptoms include no decreased concentration and no suicidal ideas. Lauren Bradley presents  today for follow-up of depression, anxiety and insomnia and chronic pain.   At visit Jun 08, 2018.  No major meds were changed except she was trying to gradually increase the Abilify to 2 mg daily for optimal mood effect.  I feel much better on the Abilify.  Makes my brain feel more normal and focus is better and calmer overall.   visit August 31, 2018.  She had asked to increase Abilify from 2 to 3 mg daily.  She had seen Abilify benefit at 2 mg but wished for additional benefit for mood and anxiety.  visit November 2020.  The following was noted: Has done well on Abilify 3 mg with additional improvement in mood, but still has a lot of anxiety.  Dr. Laurann Bradley noticed the difference in her mood.  Managed the weight and lost to baseline.  Wonders about the increase to 5 mg to help anxiety.  Benefit for energy which is reasonable with less napping than before Abilify.  Took half Provigil last week when driving distance and did ok with it.   Helped ADHD.  Function is better.  Still some days flounder.  Not as good as paperwork years ago but better with Abilify.  Helped having Lauren Bradley at home.  Was poor function before Abilify despite trying hard. Abilify helped the depression clearly and very beneficial.  Not always sad now.  HA resolved.  Initially it caused great energy like a motor running and reduced need for sleep to 5-6 hours.  That has resolved.  SE weight gain without increasing calories.  Changed her sleep schedule for the better.  To bed earlier and up earlier.    I didn't realize how  depressed I was.  Better organization and function and productivity.  Abilify helped regulate and correct her sleep wake schedule.  Not oversleeping now. Anxiety remains high chronically.  Can talk too much when anxious and her accountant commented on it to her.  QT has been good for her.  Chronic social anxiety.  Can be crippled from anxiety.   Plan: Trial increase Abilify 5 mg daily in hopes of further reducing anxiety and depressive symptoms.  05/31/2019 appointment the following is noted: Sleep well with naltrexone and alprazolam 0.25 mg HS. Better energy and alertness with modafinil 100 but a little less effect over time.  Some degree of tolerance. Hard to get paperwork done. Done well with aripiprazole 5 mg daily. Helped energy and coping with calmer manner. So much less depressed with Abilify.  Concerns over Hgb A1C which had been borderline in the past and is up a little to 5.9 now.  No extra weight at this point.  Is exercising.     Stressed still dealing with hypochondriac mother with constant somatization and complaining and anxiety and frequently wanting to go to the ER.  Better handling it with Abilify.  Disc management with this. Mother compulsively and annoyingly call her repeatedly.  Feels shame over the feelings she has about her mother.  Major boundary issues from her mother.  Mother  was neglectful in her childhood.  Recognizes some blacked out memories from her childhood.  Asked how to deal with this with mother.  09/11/19 appt with the following noted: Not quite as well with stress from mother bc B doesn't help much. Concerned about Abilify poss effecting glucose.  Did have high AIC in past even before Abilify.  Has to watch carbs.  Plans to see PCP Dr. [name] again about it. Poor heat tolerance this year.  Wonders if related to modafinil.  Went up to 150 mg and didn't notice a lot of issues.   No SE otherwise.  Is able to do some paperwork more effectively lately.  More pain  intolerant as well. No problems with sleep affected by modafinil.  2 dogs sleep with her.  Cough can wake her worse off tramadol.   Lost her dog to pancreatic CA.  Good help with homeopathic vet.  Feels guilty he had fever and she didn't realize it at the time. Plan: no med changes.  Continue Abilfy 5 which helped and increased modafinil to 150. And other meds.  11/21/2019 appointment with the following noted: Stress care of mother and needs help.  Wonders about moving mother in to help financially.  House needs work. Does everything for mother now.    B's son shot himself at her mother's home. Doesn't feel B respects mother and it causes problems. Realized big issues wiith ExH.  [name]'s graduation brought up some of this she thought she had addressed. Plan: no med changes  01/25/2020 appointment with the following noted: Modafinil 150 mg helped daytime sleepiness.   Sleep has been reset somewhat to a more typical sleep pattern.  Able to stay awake driving better now with modafinil.  Abilify helped energy and reset body clock.   Mo is still a handful.  When not deeply depressed she wants to shop.  Focus hard for mother.  Also concerned about paranoia issues with mother. Angst about dx of new skin diagnosis.   Stress ExH with someone known for a long time and upsetting.  Lost 10# over that.  4-5# underweight.   Also uncertainty about whether or not to get a job.  Lauren Bradley is very picky eater and difficult to prepare food for her.  Lauren Bradley is very time demanding and loses things.   Pt dx with PXE.  Plan further improvement was noted with Abilify increased to 5 mg daily so no med changes today  04/24/2020 appointment with following noted: Her Lauren Bradley comes to appt next week.   Disc boundary setting with mother who can be overwhelming. Disc Questions around Chronic viral illness complications.  She got better with diet changes and weight lifting. Worked on CBT to deal with negative thinking and mother.  Talks with  her daily. Hardd to deal with Lauren Bradley. Plan no med changes  06/25/2020 appointment with the following noted: Disc concerns about Covid vaccine and questions.  A lot of fatigue. Exercise a lot more.   Stress alimony drops August 1 and worry.  Also with drop in stock market. Causing anxiety.   Gets 7 hours sleep but weekends 8-9 hours.   Keep wanting to wean Xanax.  Disc concerns. Disc relationship concerns around Ex. Not shakey or jittery. Exercise twice daily.  Busy all day. Plan: Increase modafinil 150 mg and 50 mg Noon for hypersomnolence and ADD.    09/17/2020 appt noted: Stress over alimony ending. Some recennt conflict with Ex and son getting drug into the middle of  it.  Angry email from Ex H.  Wants advice about how to deal with Lauren Bradley and respond to the situation.  Extensive discussion over stressors related to family.  Wants to decide to respond in manner that does not hurt her interests. Patient reports stable mood and denies depressed or irritable moods.  .  Patient denies difficulty with sleep initiation or maintenance. Denies appetite disturbance.  Patient reports that energy and motivation have been good.  Patient denies any difficulty with concentration.  Patient denies any suicidal ideation.  11/24/20 appt noted; Botox helps migraine. But worse lately. Poor memory concerning.  Worrying over alimony drop in Sept.   On Abilify 5, duloxetine 90, modafinil 150, Xanax 0.5 mg 2 at night and 1 prn anxiety. Disc concerns over's [name]'s confidence level. Mood is OK.  Does have lunch with friends monthly. Stress with needy mother who calls constantly. Plan no med changes  03/18/21 appt noted: Learning Spanish to help brain health. Disc relationship issues. Still care taking Lauren Bradley and uncle and somewhat for son.  Worries over son. Lauren Bradley mentally worse last 3-4 weeks and somatically focused telling people she was going to die. B Liliane Channel took her to [facility]. She was OK. Enjoys exercise  daily. Stays busy.  Never bored or lonely.  Don't want to marry. No problems with meds. Chronic issues with Ex being problematic.    Some social anxiety still significant.  Past Psychiatric Medication Trials: Abilify 3 mg helpful,  duloxetine 90, venlafaxine irritable, buspirone side effects, trycyclic antidepressants, Wellbutrin, Zoloft, Paxil, Modafinil 150, Vyvanse 30, Ritalin, Adderall,   propranolol Belsomra, Lunesta, Ambien, trazodone, alprazolam, doxepin, mirtazapine, clonazepam  Review of Systems:  Review of Systems  Constitutional:  Positive for fatigue.  Respiratory:  Positive for cough. Negative for shortness of breath.   Musculoskeletal:  Positive for arthralgias, back pain and myalgias. Negative for gait problem.  Skin:  Positive for rash.  Neurological:  Positive for headaches. Negative for dizziness, tremors and weakness.  Psychiatric/Behavioral:  Negative for agitation, behavioral problems, confusion, decreased concentration, dysphoric mood, hallucinations, self-injury, sleep disturbance and suicidal ideas. The patient is nervous/anxious. The patient is not hyperactive.    Medications: I have reviewed the patient's current medications.  Current Outpatient Medications  Medication Sig Dispense Refill   albuterol (VENTOLIN HFA) 108 (90 Base) MCG/ACT inhaler Inhale 2 puffs into the lungs every 6 (six) hours as needed for wheezing or shortness of breath.     ARIPiprazole (ABILIFY) 5 MG tablet TAKE ONE TABLET BY MOUTH ONCE DAILY 90 tablet 0   azelastine (ASTELIN) 0.1 % nasal spray      calcium carbonate (OS-CAL) 600 MG TABS tablet Take 600 mg by mouth.     DULoxetine (CYMBALTA) 30 MG capsule TAKE ONE CAPSULE BY MOUTH ONCE DAILY WITH 60 MG CAPSULE 90 capsule 1   DULoxetine (CYMBALTA) 60 MG capsule TAKE ONE CAPSULE BY MOUTH ONCE DAILY WITH 30 MG CAPSULE 90 capsule 1   EPINEPHrine (EPIPEN JR) 0.15 MG/0.3ML injection      estradiol (VIVELLE-DOT) 0.075 MG/24HR estradiol 0.075  mg/24 hr semiweekly transdermal patch  APPLY 1 PATCH TO SKIN TWICE WEEKLY AS DIRECTED     famotidine (PEPCID) 20 MG tablet One after supper 30 tablet 11   fexofenadine (ALLEGRA) 180 MG tablet Take 180 mg by mouth daily.     Levomefolate Glucosamine (METHYLFOLATE PO) Take by mouth.     lithium carbonate 150 MG capsule Take 1 capsule (150 mg total) by mouth daily. 90 capsule 3  MAGNESIUM MALATE PO Take by mouth daily.     NP THYROID 15 MG tablet Take 15 mg by mouth daily.     pantoprazole (PROTONIX) 40 MG tablet Take 1 tablet (40 mg total) by mouth daily. Take 30-60 min before first meal of the day 30 tablet 2   progesterone (PROMETRIUM) 200 MG capsule Take by mouth.     sodium fluoride (FLUORISHIELD) 1.1 % GEL dental gel SF 5000 Plus 1.1 % dental cream     thyroid (ARMOUR) 90 MG tablet TAKE 1 TABLET BY MOUTH EVERY DAY ON AN EMPTY STOMACH WITH 15 MG DOSE     Vitamin D, Ergocalciferol, (DRISDOL) 50000 units CAPS capsule Take 50,000 Units by mouth every 7 (seven) days.      ALPRAZolam (XANAX) 0.5 MG tablet 2 tablets at night and 1 daily as needed for anxierty 75 tablet 4   modafinil (PROVIGIL) 100 MG tablet TAKE 1 AND 1/2 TABLETS BY MOUTH EVERY MORNING and TAKE 1/2 TABLET BY MOUTH AT NOON 60 tablet 3   SUMAtriptan (IMITREX) 100 MG tablet as needed.     No current facility-administered medications for this visit.    Medication Side Effects: HA stopped with Abilify, weight gain, hungry is odd  Allergies:  Allergies  Allergen Reactions   Hydrocodone Itching   Codeine Nausea And Vomiting   Other    Sulfa Antibiotics    Cephalosporins Rash   Sulfamethoxazole Rash    Unknown reaction    Past Medical History:  Diagnosis Date   Asthma    DDD (degenerative disc disease), cervical    DDD (degenerative disc disease), lumbar    Fatigue    Fibromyalgia    Infertility, female    Osteoarthritis     Family History  Problem Relation Age of Onset   Fibromyalgia Mother    Kidney failure  Mother    Mental illness Mother    Heart disease Father    COPD Father    Cancer Father        colon, liver    Hypothyroidism Son     Social History   Socioeconomic History   Marital status: Single    Spouse name: Not on file   Number of children: Not on file   Years of education: Not on file   Highest education level: Not on file  Occupational History   Not on file  Tobacco Use   Smoking status: Never   Smokeless tobacco: Never  Vaping Use   Vaping Use: Never used  Substance and Sexual Activity   Alcohol use: Not Currently   Drug use: No   Sexual activity: Not on file  Other Topics Concern   Not on file  Social History Narrative   Not on file   Social Determinants of Health   Financial Resource Strain: Not on file  Food Insecurity: Not on file  Transportation Needs: Not on file  Physical Activity: Not on file  Stress: Not on file  Social Connections: Not on file  Intimate Partner Violence: Not on file    Past Medical History, Surgical history, Social history, and Family history were reviewed and updated as appropriate.   Please see review of systems for further details on the patient's review from today.   Objective:   Physical Exam:  There were no vitals taken for this visit.  Physical Exam Constitutional:      General: She is not in acute distress. Musculoskeletal:        General: No  deformity.  Neurological:     Mental Status: She is alert and oriented to person, place, and time.     Cranial Nerves: No dysarthria.     Coordination: Coordination normal.  Psychiatric:        Attention and Perception: Attention and perception normal. She does not perceive auditory or visual hallucinations.        Mood and Affect: Mood is anxious. Mood is not depressed. Affect is not labile, blunt, angry, tearful or inappropriate.        Speech: Speech normal. Speech is not slurred.        Behavior: Behavior normal. Behavior is cooperative.        Thought Content:  Thought content normal. Thought content is not paranoid or delusional. Thought content does not include homicidal or suicidal ideation. Thought content does not include suicidal plan.        Cognition and Memory: Cognition and memory normal.        Judgment: Judgment normal.     Comments: Insight fair to good.  Depression has almost resolved with the Abilify for the most part.   Anxiety remains moderate worse with relational issues eith Ex     Lab Review:     Component Value Date/Time   NA 137 02/21/2017 1635   K 4.3 02/21/2017 1635   CL 102 02/21/2017 1635   CO2 29 02/21/2017 1635   GLUCOSE 88 02/21/2017 1635   BUN 16 02/21/2017 1635   CREATININE 0.58 02/21/2017 1635   CALCIUM 9.4 02/21/2017 1635   PROT 6.8 02/21/2017 1635   ALBUMIN 4.4 12/30/2015 1507   AST 22 02/21/2017 1635   ALT 20 02/21/2017 1635   ALKPHOS 28 (L) 12/30/2015 1507   BILITOT 0.3 02/21/2017 1635   GFRNONAA 97 02/21/2017 1635   GFRAA 112 02/21/2017 1635       Component Value Date/Time   WBC 6.6 02/21/2017 1635   RBC 4.36 02/21/2017 1635   HGB 12.9 02/21/2017 1635   HCT 38.2 02/21/2017 1635   PLT 327 02/21/2017 1635   MCV 87.6 02/21/2017 1635   MCH 29.6 02/21/2017 1635   MCHC 33.8 02/21/2017 1635   RDW 12.4 02/21/2017 1635   LYMPHSABS 1,228 02/21/2017 1635   MONOABS 402 12/30/2015 1507   EOSABS 132 02/21/2017 1635   BASOSABS 73 02/21/2017 1635    No results found for: POCLITH, LITHIUM   No results found for: PHENYTOIN, PHENOBARB, VALPROATE, CBMZ   .res Assessment: Plan:    Major depressive disorder, recurrent episode, moderate (HCC)  Generalized anxiety disorder - Plan: ALPRAZolam (XANAX) 0.5 MG tablet  Social anxiety disorder  Attention deficit hyperactivity disorder (ADHD), predominantly inattentive type - Plan: modafinil (PROVIGIL) 100 MG tablet  Hypersomnolence disorder, persistent - Plan: modafinil (PROVIGIL) 100 MG tablet   Less depressed with the Abilify clear benefit for  depression and anxiety.   It's helped productivity and mood and energy and less hypersomnolence. Continue Abilify to 5 mg daily.  Disc glucose and A1c again.Marland Kitchen    Option check labs and Hgb A1C bc in past was prediabetic. She's using buffered berberine.  She'd like to hold off on metformin.  We discussed the short-term risks associated with benzodiazepines including sedation and increased fall risk among others.  Discussed long-term side effect risk including dependence, potential withdrawal symptoms, and the potential eventual dose-related risk of dementia.  But recent studies from 2020 dispute this association between benzodiazepines and dementia risk. Newer studies in 2020 do not support an association with  dementia. Extensive discussion of it.   She was successful at reducing Xanax for sleep.  Discussed potential metabolic side effects associated with atypical antipsychotics, as well as potential risk for movement side effects. Advised pt to contact office if movement side effects occur.   Continue duloxetine 90.Marland Kitchen  Disc risk sweating  And heat intolerance with it.  Answered questions about tramadol with asthma cough. SE.   Continue modafinil 150 mg and 50 mg Noon for hypersomnolence and ADD.  She doesn't tolerate traditional stimulants like Adderall and MPH..She has gotten benefit so far with it.  Disc pros and cons of increasing.  Protect sleep and try to get more if possible.    Modafinil and Abilify have corrected delayed sleep phase disorder.  Lithium 150 mg for neurocognitive protection.   Problem solving therapy and supportive interventions around dealing with her ex-husband and her son and other relationships between them.  This is particularly complicated because her son lives with her but work works for her ex-husband.  There has been some recent hostile contact tact from her ex which is caused additional stress.  She wanted discussion about how to respond to the situation.  This was  discussed at some length.  No med changes.  45 min appt  FU 3 mos  Lynder Parents, MD, DFAPA   Please see After Visit Summary for patient specific instructions.  Future Appointments  Date Time Provider Short  06/23/2021  2:30 PM [name], MD [facility] None    No orders of the defined types were placed in this encounter.      -------------------------------

## 2021-03-24 DIAGNOSIS — J3089 Other allergic rhinitis: Secondary | ICD-10-CM | POA: Diagnosis not present

## 2021-03-24 DIAGNOSIS — J301 Allergic rhinitis due to pollen: Secondary | ICD-10-CM | POA: Diagnosis not present

## 2021-03-24 DIAGNOSIS — J3081 Allergic rhinitis due to animal (cat) (dog) hair and dander: Secondary | ICD-10-CM | POA: Diagnosis not present

## 2021-03-31 DIAGNOSIS — J3089 Other allergic rhinitis: Secondary | ICD-10-CM | POA: Diagnosis not present

## 2021-03-31 DIAGNOSIS — J3081 Allergic rhinitis due to animal (cat) (dog) hair and dander: Secondary | ICD-10-CM | POA: Diagnosis not present

## 2021-03-31 DIAGNOSIS — J301 Allergic rhinitis due to pollen: Secondary | ICD-10-CM | POA: Diagnosis not present

## 2021-04-08 DIAGNOSIS — H43812 Vitreous degeneration, left eye: Secondary | ICD-10-CM | POA: Diagnosis not present

## 2021-04-09 DIAGNOSIS — J3089 Other allergic rhinitis: Secondary | ICD-10-CM | POA: Diagnosis not present

## 2021-04-09 DIAGNOSIS — J3081 Allergic rhinitis due to animal (cat) (dog) hair and dander: Secondary | ICD-10-CM | POA: Diagnosis not present

## 2021-04-09 DIAGNOSIS — J301 Allergic rhinitis due to pollen: Secondary | ICD-10-CM | POA: Diagnosis not present

## 2021-04-16 DIAGNOSIS — J301 Allergic rhinitis due to pollen: Secondary | ICD-10-CM | POA: Diagnosis not present

## 2021-04-16 DIAGNOSIS — J3089 Other allergic rhinitis: Secondary | ICD-10-CM | POA: Diagnosis not present

## 2021-04-16 DIAGNOSIS — J3081 Allergic rhinitis due to animal (cat) (dog) hair and dander: Secondary | ICD-10-CM | POA: Diagnosis not present

## 2021-04-27 DIAGNOSIS — J301 Allergic rhinitis due to pollen: Secondary | ICD-10-CM | POA: Diagnosis not present

## 2021-04-27 DIAGNOSIS — J3081 Allergic rhinitis due to animal (cat) (dog) hair and dander: Secondary | ICD-10-CM | POA: Diagnosis not present

## 2021-04-27 DIAGNOSIS — J3089 Other allergic rhinitis: Secondary | ICD-10-CM | POA: Diagnosis not present

## 2021-04-28 ENCOUNTER — Ambulatory Visit: Payer: Medicare Other | Admitting: Psychiatry

## 2021-05-04 DIAGNOSIS — J3089 Other allergic rhinitis: Secondary | ICD-10-CM | POA: Diagnosis not present

## 2021-05-04 DIAGNOSIS — J3081 Allergic rhinitis due to animal (cat) (dog) hair and dander: Secondary | ICD-10-CM | POA: Diagnosis not present

## 2021-05-04 DIAGNOSIS — J301 Allergic rhinitis due to pollen: Secondary | ICD-10-CM | POA: Diagnosis not present

## 2021-05-11 DIAGNOSIS — J301 Allergic rhinitis due to pollen: Secondary | ICD-10-CM | POA: Diagnosis not present

## 2021-05-11 DIAGNOSIS — J3089 Other allergic rhinitis: Secondary | ICD-10-CM | POA: Diagnosis not present

## 2021-05-18 DIAGNOSIS — J3089 Other allergic rhinitis: Secondary | ICD-10-CM | POA: Diagnosis not present

## 2021-05-18 DIAGNOSIS — J3081 Allergic rhinitis due to animal (cat) (dog) hair and dander: Secondary | ICD-10-CM | POA: Diagnosis not present

## 2021-05-18 DIAGNOSIS — J301 Allergic rhinitis due to pollen: Secondary | ICD-10-CM | POA: Diagnosis not present

## 2021-05-19 DIAGNOSIS — G43719 Chronic migraine without aura, intractable, without status migrainosus: Secondary | ICD-10-CM | POA: Diagnosis not present

## 2021-05-25 ENCOUNTER — Other Ambulatory Visit: Payer: Self-pay | Admitting: Psychiatry

## 2021-05-25 DIAGNOSIS — F331 Major depressive disorder, recurrent, moderate: Secondary | ICD-10-CM

## 2021-05-26 DIAGNOSIS — J3081 Allergic rhinitis due to animal (cat) (dog) hair and dander: Secondary | ICD-10-CM | POA: Diagnosis not present

## 2021-05-26 DIAGNOSIS — J301 Allergic rhinitis due to pollen: Secondary | ICD-10-CM | POA: Diagnosis not present

## 2021-05-26 DIAGNOSIS — J3089 Other allergic rhinitis: Secondary | ICD-10-CM | POA: Diagnosis not present

## 2021-06-03 ENCOUNTER — Encounter: Payer: Self-pay | Admitting: Psychiatry

## 2021-06-03 ENCOUNTER — Ambulatory Visit (INDEPENDENT_AMBULATORY_CARE_PROVIDER_SITE_OTHER): Payer: Medicare Other | Admitting: Psychiatry

## 2021-06-03 DIAGNOSIS — G4710 Hypersomnia, unspecified: Secondary | ICD-10-CM | POA: Diagnosis not present

## 2021-06-03 DIAGNOSIS — Z853 Personal history of malignant neoplasm of breast: Secondary | ICD-10-CM | POA: Diagnosis not present

## 2021-06-03 DIAGNOSIS — F331 Major depressive disorder, recurrent, moderate: Secondary | ICD-10-CM

## 2021-06-03 DIAGNOSIS — F411 Generalized anxiety disorder: Secondary | ICD-10-CM | POA: Diagnosis not present

## 2021-06-03 DIAGNOSIS — F900 Attention-deficit hyperactivity disorder, predominantly inattentive type: Secondary | ICD-10-CM

## 2021-06-03 DIAGNOSIS — F4010 Social phobia, unspecified: Secondary | ICD-10-CM | POA: Diagnosis not present
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
## 2021-06-10 DIAGNOSIS — M533 Sacrococcygeal disorders, not elsewhere classified: Secondary | ICD-10-CM | POA: Diagnosis not present

## 2021-06-10 NOTE — Progress Notes (Deleted)
Office Visit Note  Patient: Lauren Bradley             Date of Birth: 10-24-1950           MRN: [id number]             PCP: Lauren Orn, MD Referring: Lauren Orn, MD Visit Date: 06/23/2021 Occupation: '@GUAROCC'$ @  Subjective:  No chief complaint on file.   History of Present Illness: Lauren Bradley is a 71 y.o. female ***   Activities of Daily Living:  Patient reports morning stiffness for *** {minute/hour:19697}.   Patient {ACTIONS;DENIES/REPORTS:21021675::"Denies"} nocturnal pain.  Difficulty dressing/grooming: {ACTIONS;DENIES/REPORTS:21021675::"Denies"} Difficulty climbing stairs: {ACTIONS;DENIES/REPORTS:21021675::"Denies"} Difficulty getting out of chair: {ACTIONS;DENIES/REPORTS:21021675::"Denies"} Difficulty using hands for taps, buttons, cutlery, and/or writing: {ACTIONS;DENIES/REPORTS:21021675::"Denies"}  No Rheumatology ROS completed.   PMFS History:  Patient Active Problem List   Diagnosis Date Noted   Upper airway cough syndrome 01/15/2021   Major depressive disorder, single episode, severe (Lauren Bradley) 10/06/2017   History of vitamin D deficiency 10/12/2016   History of seasonal allergies 12/29/2015   Fibromyalgia 12/29/2015   Other fatigue 12/29/2015   Primary osteoarthritis of both hands 12/29/2015   Primary osteoarthritis of both feet 12/29/2015   DJD (degenerative joint disease), cervical 12/29/2015   DDD (degenerative disc disease), lumbar 12/29/2015   Osteopenia of multiple sites 12/29/2015   History of migraine 12/29/2015    Past Medical History:  Diagnosis Date   Asthma    DDD (degenerative disc disease), cervical    DDD (degenerative disc disease), lumbar    Fatigue    Fibromyalgia    Infertility, female    Osteoarthritis     Family History  Problem Relation Age of Onset   Fibromyalgia Mother    Kidney failure Mother    Mental illness Mother    Heart disease Father    COPD Father    Cancer Father        colon, liver    Hypothyroidism Son     Past Surgical History:  Procedure Laterality Date   CESAREAN SECTION     FOOT SURGERY     GALLBLADDER SURGERY     KNEE SURGERY     Social History   Social History Narrative   Not on file   Immunization History  Administered Date(s) Administered   Influenza, High Dose Seasonal PF 03/22/2019, 10/25/2019, 11/11/2020   Influenza-Unspecified 12/10/2016   Moderna Sars-Covid-2 Vaccination 03/09/2019, 03/22/2019, 04/06/2019   Pneumococcal Polysaccharide-23 10/25/2019, 11/05/2020   Zoster Recombinat (Shingrix) 08/31/2017, 11/29/2017     Objective: Vital Signs: There were no vitals taken for this visit.   Physical Exam   Musculoskeletal Exam: ***  CDAI Exam: CDAI Score: -- Patient Global: --; Provider Global: -- Swollen: --; Tender: -- Joint Exam 06/23/2021   No joint exam has been documented for this visit   There is currently no information documented on the homunculus. Go to the Rheumatology activity and complete the homunculus joint exam.  Investigation: No additional findings.  Imaging: No results found.  Recent Labs: Lab Results  Component Value Date   WBC 6.6 02/21/2017   HGB 12.9 02/21/2017   PLT 327 02/21/2017   NA 137 02/21/2017   K 4.3 02/21/2017   CL 102 02/21/2017   CO2 29 02/21/2017   GLUCOSE 88 02/21/2017   BUN 16 02/21/2017   CREATININE 0.58 02/21/2017   BILITOT 0.3 02/21/2017   ALKPHOS 28 (L) 12/30/2015   AST 22 02/21/2017   ALT 20 02/21/2017   PROT 6.8 02/21/2017  ALBUMIN 4.4 12/30/2015   CALCIUM 9.4 02/21/2017   GFRAA 112 02/21/2017    Speciality Comments: No specialty comments available.  Procedures:  No procedures performed Allergies: Hydrocodone, Codeine, Other, Sulfa antibiotics, Cephalosporins, and Sulfamethoxazole   Assessment / Plan:     Visit Diagnoses: No diagnosis found.  Orders: No orders of the defined types were placed in this encounter.  No orders of the defined types were placed in this  encounter.   Face-to-face time spent with patient was *** minutes. Greater than 50% of time was spent in counseling and coordination of care.  Follow-Up Instructions: No follow-ups on file.   Earnestine Mealing, CMA  Note - This record has been created using Editor, commissioning.  Chart creation errors have been sought, but may not always  have been located. Such creation errors do not reflect on  the standard of medical care.

## 2021-06-11 DIAGNOSIS — J3081 Allergic rhinitis due to animal (cat) (dog) hair and dander: Secondary | ICD-10-CM | POA: Diagnosis not present

## 2021-06-11 DIAGNOSIS — J3089 Other allergic rhinitis: Secondary | ICD-10-CM | POA: Diagnosis not present

## 2021-06-11 DIAGNOSIS — J301 Allergic rhinitis due to pollen: Secondary | ICD-10-CM | POA: Diagnosis not present

## 2021-06-16 ENCOUNTER — Other Ambulatory Visit: Payer: Self-pay | Admitting: Psychiatry

## 2021-06-16 DIAGNOSIS — F411 Generalized anxiety disorder: Secondary | ICD-10-CM

## 2021-06-16 DIAGNOSIS — F4010 Social phobia, unspecified: Secondary | ICD-10-CM

## 2021-06-16 DIAGNOSIS — F331 Major depressive disorder, recurrent, moderate: Secondary | ICD-10-CM

## 2021-06-16 MED ORDER — DULOXETINE HCL 30 MG PO CPEP: 90.0000 mg | ORAL_CAPSULE | Freq: Every day | ORAL | 0 refills | Status: DC

## 2021-06-16 NOTE — Telephone Encounter (Signed)
Patient does not need a RF currently, but need to rewrite Rx for 3 30 mg capsules.

## 2021-06-17 DIAGNOSIS — J3089 Other allergic rhinitis: Secondary | ICD-10-CM | POA: Diagnosis not present

## 2021-06-17 DIAGNOSIS — J3081 Allergic rhinitis due to animal (cat) (dog) hair and dander: Secondary | ICD-10-CM | POA: Diagnosis not present

## 2021-06-17 DIAGNOSIS — J301 Allergic rhinitis due to pollen: Secondary | ICD-10-CM | POA: Diagnosis not present

## 2021-06-23 ENCOUNTER — Ambulatory Visit: Payer: Medicare Other | Admitting: Rheumatology

## 2021-06-23 DIAGNOSIS — R5383 Other fatigue: Secondary | ICD-10-CM

## 2021-06-23 DIAGNOSIS — M19041 Primary osteoarthritis, right hand: Secondary | ICD-10-CM

## 2021-06-23 DIAGNOSIS — M8589 Other specified disorders of bone density and structure, multiple sites: Secondary | ICD-10-CM

## 2021-06-23 DIAGNOSIS — J3081 Allergic rhinitis due to animal (cat) (dog) hair and dander: Secondary | ICD-10-CM | POA: Diagnosis not present

## 2021-06-23 DIAGNOSIS — M19071 Primary osteoarthritis, right ankle and foot: Secondary | ICD-10-CM

## 2021-06-23 DIAGNOSIS — M797 Fibromyalgia: Secondary | ICD-10-CM

## 2021-06-23 DIAGNOSIS — Z8639 Personal history of other endocrine, nutritional and metabolic disease: Secondary | ICD-10-CM

## 2021-06-23 DIAGNOSIS — M5030 Other cervical disc degeneration, unspecified cervical region: Secondary | ICD-10-CM

## 2021-06-23 DIAGNOSIS — Z8669 Personal history of other diseases of the nervous system and sense organs: Secondary | ICD-10-CM

## 2021-06-23 DIAGNOSIS — J301 Allergic rhinitis due to pollen: Secondary | ICD-10-CM | POA: Diagnosis not present

## 2021-06-23 DIAGNOSIS — M5136 Other intervertebral disc degeneration, lumbar region: Secondary | ICD-10-CM

## 2021-06-23 DIAGNOSIS — J3089 Other allergic rhinitis: Secondary | ICD-10-CM | POA: Diagnosis not present

## 2021-06-23 NOTE — Progress Notes (Signed)
Office Visit Note  Patient: Lauren Bradley             Date of Birth: 02-26-1950           MRN: [id number]             PCP: Lavone Orn, MD Referring: Lavone Orn, MD Visit Date: 06/25/2021 Occupation: '@GUAROCC'$ @  Subjective:  Coccyx pain  History of Present Illness: Lauren Bradley is a 71 y.o. female with history of osteoarthritis.  She states that she developed coccyx fracture as she accidentally sat on a car seat belt buckle.  She was seen by orthopedic surgeon.  She states she still has a lot of discomfort in the coccyx region.  She is trying to ride her exercise bike which is difficult for her.  She has some difficulty walking.  She continues to have discomfort in her hands especially over the [employer] joints.  She enjoys gardening.  She has discomfort in her feet which limits her from walking.  She has stiffness in her cervical spine but no discomfort.  She has off-and-on discomfort in her lower back.  She states she used to exercise but 3 hours a day but recently she is unable to exercise.  She is also practicing intermittent fasting and has unintentional weight loss.  Activities of Daily Living:  Patient reports morning stiffness for 1 hour.   Patient Reports nocturnal pain.  Difficulty dressing/grooming: Denies Difficulty climbing stairs: Reports Difficulty getting out of chair: Denies Difficulty using hands for taps, buttons, cutlery, and/or writing: Reports  Review of Systems  Constitutional:  Positive for fatigue.  HENT:  Positive for mouth dryness.   Eyes:  Positive for dryness.  Respiratory:  Negative for shortness of breath.   Cardiovascular:  Negative for swelling in legs/feet.  Gastrointestinal:  Negative for constipation.  Endocrine: Positive for excessive thirst.  Genitourinary:  Negative for difficulty urinating.  Musculoskeletal:  Positive for joint pain, gait problem, joint pain and morning stiffness. Negative for joint swelling.  Skin:  Negative for rash.   Allergic/Immunologic: Negative for susceptible to infections.  Neurological:  Negative for numbness.  Hematological:  Positive for bruising/bleeding tendency.  Psychiatric/Behavioral:  Negative for sleep disturbance.     PMFS History:  Patient Active Problem List   Diagnosis Date Noted   Upper airway cough syndrome 01/15/2021   Major depressive disorder, single episode, severe ([place]) 10/06/2017   History of vitamin D deficiency 10/12/2016   History of seasonal allergies 12/29/2015   Fibromyalgia 12/29/2015   Other fatigue 12/29/2015   Primary osteoarthritis of both hands 12/29/2015   Primary osteoarthritis of both feet 12/29/2015   DJD (degenerative joint disease), cervical 12/29/2015   DDD (degenerative disc disease), lumbar 12/29/2015   Osteopenia of multiple sites 12/29/2015   History of migraine 12/29/2015    Past Medical History:  Diagnosis Date   Asthma    DDD (degenerative disc disease), cervical    DDD (degenerative disc disease), lumbar    Fatigue    Fibromyalgia    Infertility, female    Osteoarthritis     Family History  Problem Relation Age of Onset   Fibromyalgia Mother    Kidney failure Mother    Mental illness Mother    Heart disease Father    COPD Father    Cancer Father        colon, liver    Hypothyroidism Son    Past Surgical History:  Procedure Laterality Date   CESAREAN SECTION  FOOT SURGERY     GALLBLADDER SURGERY     KNEE SURGERY     Social History   Social History Narrative   Not on file   Immunization History  Administered Date(s) Administered   Influenza, High Dose Seasonal PF 03/22/2019, 10/25/2019, 11/11/2020   Influenza-Unspecified 12/10/2016   Moderna Sars-Covid-2 Vaccination 03/09/2019, 03/22/2019, 04/06/2019   Pneumococcal Polysaccharide-23 10/25/2019, 11/05/2020   Zoster Recombinat (Shingrix) 08/31/2017, 11/29/2017     Objective: Vital Signs: BP 118/72 (BP Location: Left Arm, Patient Position: Sitting, Cuff Size:  Normal)   Pulse (!) 111   Resp 14   Ht '5\' 3"'$  (1.6 m)   Wt 99 lb (44.9 kg)   BMI 17.54 kg/m    Physical Exam Vitals and nursing note reviewed.  Constitutional:      Appearance: She is well-developed.  HENT:     Head: Normocephalic and atraumatic.  Eyes:     Conjunctiva/sclera: Conjunctivae normal.  Cardiovascular:     Rate and Rhythm: Normal rate and regular rhythm.     Heart sounds: Normal heart sounds.  Pulmonary:     Effort: Pulmonary effort is normal.     Breath sounds: Normal breath sounds.  Abdominal:     General: Bowel sounds are normal.     Palpations: Abdomen is soft.  Musculoskeletal:     Cervical back: Normal range of motion.  Lymphadenopathy:     Cervical: No cervical adenopathy.  Skin:    General: Skin is warm and dry.     Capillary Refill: Capillary refill takes less than 2 seconds.  Neurological:     Mental Status: She is alert and oriented to person, place, and time.  Psychiatric:        Behavior: Behavior normal.      Musculoskeletal Exam: C-spine was in good range of motion.  Shoulder joints, elbow joints, wrist joints in good range of motion.  She had bilateral CMC PIP and DIP thickening.  Hip joints and knee joints in good range of motion.  She had no tenderness over ankles or MTPs.  She had coccygeal tenderness.  CDAI Exam: CDAI Score: -- Patient Global: --; Provider Global: -- Swollen: --; Tender: -- Joint Exam 06/25/2021   No joint exam has been documented for this visit   There is currently no information documented on the homunculus. Go to the Rheumatology activity and complete the homunculus joint exam.  Investigation: No additional findings.  Imaging: No results found.  Recent Labs: Lab Results  Component Value Date   WBC 6.6 02/21/2017   HGB 12.9 02/21/2017   PLT 327 02/21/2017   NA 137 02/21/2017   K 4.3 02/21/2017   CL 102 02/21/2017   CO2 29 02/21/2017   GLUCOSE 88 02/21/2017   BUN 16 02/21/2017   CREATININE 0.58  02/21/2017   BILITOT 0.3 02/21/2017   ALKPHOS 28 (L) 12/30/2015   AST 22 02/21/2017   ALT 20 02/21/2017   PROT 6.8 02/21/2017   ALBUMIN 4.4 12/30/2015   CALCIUM 9.4 02/21/2017   GFRAA 112 02/21/2017    Speciality Comments: No specialty comments available.  Procedures:  No procedures performed Allergies: Hydrocodone, Codeine, Other, Sulfa antibiotics, Cephalosporins, and Sulfamethoxazole   Assessment / Plan:     Visit Diagnoses: Primary osteoarthritis of both hands-she had bilateral CMC PIP and DIP thickening with discomfort mostly over this left CMC joint.  She has been using a splint rings in her hands.  She states she had good response to physical therapy in the  past.  She will resume some of the exercises she learned during the physical therapy.  Joint protection was discussed.  Primary osteoarthritis of both feet-she has discomfort with prolonged walk.  Proper running shoes were discussed.  DDD (degenerative disc disease), cervical-she does stiffness with range of motion of her cervical spine.  Stretching exercise was emphasized.  DDD (degenerative disc disease), lumbar-she has been doing core strengthening exercises which are helpful.  Closed fracture of coccyx, sequela-she acquired coccyx fracture about 2 weeks ago.  She has been having difficulty walking and sitting.  Use of coccyx cushion was discussed.  Fibromyalgia-she states her fibromyalgia symptoms are well controlled when she is exercising.  She did not have many tender points today.  Other fatigue-related to fibromyalgia.  Osteopenia of multiple sites-she has been taking calcium and vitamin D and also exercising on a regular basis.  Her bone density is followed by her GYN.  Calcium rich diet and exercise was emphasized.  History of vitamin D deficiency-she is on vitamin D supplement.  Orders: No orders of the defined types were placed in this encounter.  No orders of the defined types were placed in this  encounter.   Face-to-face time spent with patient was 30 minutes. Greater than 50% of time was spent in counseling and coordination of care.  Follow-Up Instructions: Return in about 1 year (around 06/26/2022) for Osteoarthritis.   [name], MD  Note - This record has been created using Editor, commissioning.  Chart creation errors have been sought, but may not always  have been located. Such creation errors do not reflect on  the standard of medical care.

## 2021-06-24 ENCOUNTER — Other Ambulatory Visit: Payer: Self-pay | Admitting: Psychiatry

## 2021-06-24 DIAGNOSIS — F411 Generalized anxiety disorder: Secondary | ICD-10-CM

## 2021-06-24 DIAGNOSIS — F4010 Social phobia, unspecified: Secondary | ICD-10-CM

## 2021-06-24 DIAGNOSIS — F331 Major depressive disorder, recurrent, moderate: Secondary | ICD-10-CM

## 2021-06-25 ENCOUNTER — Other Ambulatory Visit: Payer: Self-pay | Admitting: Psychiatry

## 2021-06-25 ENCOUNTER — Encounter: Payer: Self-pay | Admitting: Rheumatology

## 2021-06-25 ENCOUNTER — Ambulatory Visit (INDEPENDENT_AMBULATORY_CARE_PROVIDER_SITE_OTHER): Payer: Medicare Other | Admitting: Rheumatology

## 2021-06-25 VITALS — BP 118/72 | HR 111 | Resp 14 | Ht 63.0 in | Wt 99.0 lb

## 2021-06-25 DIAGNOSIS — F4010 Social phobia, unspecified: Secondary | ICD-10-CM

## 2021-06-25 DIAGNOSIS — M19071 Primary osteoarthritis, right ankle and foot: Secondary | ICD-10-CM

## 2021-06-25 DIAGNOSIS — M19042 Primary osteoarthritis, left hand: Secondary | ICD-10-CM | POA: Diagnosis not present

## 2021-06-25 DIAGNOSIS — Z411 Encounter for cosmetic surgery: Secondary | ICD-10-CM | POA: Diagnosis not present

## 2021-06-25 DIAGNOSIS — L738 Other specified follicular disorders: Secondary | ICD-10-CM | POA: Diagnosis not present

## 2021-06-25 DIAGNOSIS — S322XXS Fracture of coccyx, sequela: Secondary | ICD-10-CM

## 2021-06-25 DIAGNOSIS — R5383 Other fatigue: Secondary | ICD-10-CM

## 2021-06-25 DIAGNOSIS — L578 Other skin changes due to chronic exposure to nonionizing radiation: Secondary | ICD-10-CM | POA: Diagnosis not present

## 2021-06-25 DIAGNOSIS — Z85828 Personal history of other malignant neoplasm of skin: Secondary | ICD-10-CM | POA: Diagnosis not present

## 2021-06-25 DIAGNOSIS — L720 Epidermal cyst: Secondary | ICD-10-CM | POA: Diagnosis not present

## 2021-06-25 DIAGNOSIS — M5030 Other cervical disc degeneration, unspecified cervical region: Secondary | ICD-10-CM

## 2021-06-25 DIAGNOSIS — L0591 Pilonidal cyst without abscess: Secondary | ICD-10-CM | POA: Diagnosis not present

## 2021-06-25 DIAGNOSIS — M5136 Other intervertebral disc degeneration, lumbar region: Secondary | ICD-10-CM | POA: Diagnosis not present

## 2021-06-25 DIAGNOSIS — M19072 Primary osteoarthritis, left ankle and foot: Secondary | ICD-10-CM | POA: Diagnosis not present

## 2021-06-25 DIAGNOSIS — M19041 Primary osteoarthritis, right hand: Secondary | ICD-10-CM

## 2021-06-25 DIAGNOSIS — M797 Fibromyalgia: Secondary | ICD-10-CM

## 2021-06-25 DIAGNOSIS — L821 Other seborrheic keratosis: Secondary | ICD-10-CM | POA: Diagnosis not present

## 2021-06-25 DIAGNOSIS — M8589 Other specified disorders of bone density and structure, multiple sites: Secondary | ICD-10-CM

## 2021-06-25 DIAGNOSIS — Z8639 Personal history of other endocrine, nutritional and metabolic disease: Secondary | ICD-10-CM | POA: Diagnosis not present

## 2021-06-25 DIAGNOSIS — D225 Melanocytic nevi of trunk: Secondary | ICD-10-CM | POA: Diagnosis not present

## 2021-06-25 DIAGNOSIS — L570 Actinic keratosis: Secondary | ICD-10-CM | POA: Diagnosis not present

## 2021-06-25 DIAGNOSIS — F411 Generalized anxiety disorder: Secondary | ICD-10-CM

## 2021-06-25 DIAGNOSIS — D2271 Melanocytic nevi of right lower limb, including hip: Secondary | ICD-10-CM | POA: Diagnosis not present

## 2021-06-25 DIAGNOSIS — F331 Major depressive disorder, recurrent, moderate: Secondary | ICD-10-CM

## 2021-06-25 DIAGNOSIS — L988 Other specified disorders of the skin and subcutaneous tissue: Secondary | ICD-10-CM | POA: Diagnosis not present

## 2021-06-25 DIAGNOSIS — L814 Other melanin hyperpigmentation: Secondary | ICD-10-CM | POA: Diagnosis not present

## 2021-07-03 DIAGNOSIS — J3081 Allergic rhinitis due to animal (cat) (dog) hair and dander: Secondary | ICD-10-CM | POA: Diagnosis not present

## 2021-07-03 DIAGNOSIS — J3089 Other allergic rhinitis: Secondary | ICD-10-CM | POA: Diagnosis not present

## 2021-07-03 DIAGNOSIS — J301 Allergic rhinitis due to pollen: Secondary | ICD-10-CM | POA: Diagnosis not present

## 2021-07-15 ENCOUNTER — Other Ambulatory Visit: Payer: Self-pay | Admitting: Psychiatry

## 2021-07-15 DIAGNOSIS — G4710 Hypersomnia, unspecified: Secondary | ICD-10-CM

## 2021-07-15 DIAGNOSIS — F900 Attention-deficit hyperactivity disorder, predominantly inattentive type: Secondary | ICD-10-CM

## 2021-07-24 DIAGNOSIS — J3089 Other allergic rhinitis: Secondary | ICD-10-CM | POA: Diagnosis not present

## 2021-07-24 DIAGNOSIS — J3081 Allergic rhinitis due to animal (cat) (dog) hair and dander: Secondary | ICD-10-CM | POA: Diagnosis not present

## 2021-07-24 DIAGNOSIS — J301 Allergic rhinitis due to pollen: Secondary | ICD-10-CM | POA: Diagnosis not present

## 2021-07-28 ENCOUNTER — Other Ambulatory Visit: Payer: Self-pay | Admitting: Psychiatry

## 2021-07-28 DIAGNOSIS — J3081 Allergic rhinitis due to animal (cat) (dog) hair and dander: Secondary | ICD-10-CM | POA: Diagnosis not present

## 2021-07-28 DIAGNOSIS — J3089 Other allergic rhinitis: Secondary | ICD-10-CM | POA: Diagnosis not present

## 2021-07-28 DIAGNOSIS — F331 Major depressive disorder, recurrent, moderate: Secondary | ICD-10-CM

## 2021-07-28 DIAGNOSIS — J301 Allergic rhinitis due to pollen: Secondary | ICD-10-CM | POA: Diagnosis not present

## 2021-07-28 DIAGNOSIS — F4010 Social phobia, unspecified: Secondary | ICD-10-CM

## 2021-07-28 DIAGNOSIS — F411 Generalized anxiety disorder: Secondary | ICD-10-CM

## 2021-08-19 DIAGNOSIS — G43719 Chronic migraine without aura, intractable, without status migrainosus: Secondary | ICD-10-CM | POA: Diagnosis not present

## 2021-08-25 ENCOUNTER — Other Ambulatory Visit: Payer: Self-pay | Admitting: Psychiatry

## 2021-08-25 DIAGNOSIS — F331 Major depressive disorder, recurrent, moderate: Secondary | ICD-10-CM

## 2021-09-02 DIAGNOSIS — Z136 Encounter for screening for cardiovascular disorders: Secondary | ICD-10-CM | POA: Diagnosis not present

## 2021-09-02 DIAGNOSIS — R7303 Prediabetes: Secondary | ICD-10-CM | POA: Diagnosis not present

## 2021-09-02 DIAGNOSIS — Z79899 Other long term (current) drug therapy: Secondary | ICD-10-CM | POA: Diagnosis not present

## 2021-09-02 DIAGNOSIS — G43709 Chronic migraine without aura, not intractable, without status migrainosus: Secondary | ICD-10-CM | POA: Diagnosis not present

## 2021-09-02 DIAGNOSIS — I10 Essential (primary) hypertension: Secondary | ICD-10-CM | POA: Diagnosis not present

## 2021-09-02 DIAGNOSIS — M797 Fibromyalgia: Secondary | ICD-10-CM | POA: Diagnosis not present

## 2021-09-02 DIAGNOSIS — M159 Polyosteoarthritis, unspecified: Secondary | ICD-10-CM | POA: Diagnosis not present

## 2021-09-02 DIAGNOSIS — F322 Major depressive disorder, single episode, severe without psychotic features: Secondary | ICD-10-CM | POA: Diagnosis not present

## 2021-09-02 DIAGNOSIS — Z0000 Encounter for general adult medical examination without abnormal findings: Secondary | ICD-10-CM | POA: Diagnosis not present

## 2021-09-02 DIAGNOSIS — E039 Hypothyroidism, unspecified: Secondary | ICD-10-CM | POA: Diagnosis not present

## 2021-09-02 DIAGNOSIS — D509 Iron deficiency anemia, unspecified: Secondary | ICD-10-CM | POA: Diagnosis not present

## 2021-09-08 ENCOUNTER — Ambulatory Visit (INDEPENDENT_AMBULATORY_CARE_PROVIDER_SITE_OTHER): Payer: Medicare Other | Admitting: Psychiatry

## 2021-09-08 ENCOUNTER — Encounter: Payer: Self-pay | Admitting: Psychiatry

## 2021-09-08 DIAGNOSIS — F900 Attention-deficit hyperactivity disorder, predominantly inattentive type: Secondary | ICD-10-CM

## 2021-09-08 DIAGNOSIS — F4010 Social phobia, unspecified: Secondary | ICD-10-CM

## 2021-09-08 DIAGNOSIS — F331 Major depressive disorder, recurrent, moderate: Secondary | ICD-10-CM | POA: Diagnosis not present

## 2021-09-08 DIAGNOSIS — F411 Generalized anxiety disorder: Secondary | ICD-10-CM

## 2021-09-08 DIAGNOSIS — G4710 Hypersomnia, unspecified: Secondary | ICD-10-CM
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
## 2021-09-22 ENCOUNTER — Ambulatory Visit (INDEPENDENT_AMBULATORY_CARE_PROVIDER_SITE_OTHER): Payer: Medicare Other | Admitting: Neurology

## 2021-09-22 ENCOUNTER — Telehealth: Payer: Self-pay | Admitting: *Deleted

## 2021-09-22 ENCOUNTER — Encounter: Payer: Self-pay | Admitting: Neurology

## 2021-09-22 VITALS — BP 119/80 | HR 91 | Ht 63.0 in | Wt 98.0 lb

## 2021-09-22 DIAGNOSIS — G43109 Migraine with aura, not intractable, without status migrainosus: Secondary | ICD-10-CM | POA: Diagnosis not present

## 2021-09-22 DIAGNOSIS — G43709 Chronic migraine without aura, not intractable, without status migrainosus: Secondary | ICD-10-CM

## 2021-09-22 MED ORDER — UBRELVY 100 MG PO TABS
100.0000 mg | ORAL_TABLET | ORAL | 11 refills | Status: DC | PRN
  Filled 2022-09-17: qty 16, 30d supply, fill #0

## 2021-09-22 MED ORDER — BOTOX 200 UNITS IJ SOLR
INTRAMUSCULAR | 1 refills | Status: AC
  Filled 2021-09-22: qty 1, fill #0

## 2021-09-22 MED ORDER — ONDANSETRON 4 MG PO TBDP: 4.0000 mg | ORAL_TABLET | Freq: Three times a day (TID) | ORAL | 3 refills | Status: AC | PRN

## 2021-09-22 NOTE — Telephone Encounter (Signed)
Chronic Migraine CPT 64615  Botox [id number] Units: 200  G43.709 Chronic Migraine without aura, not intractable, without status migrainous  Patient's first Botox with Dr Jaynee Eagles is on 11/11/21. This is a transfer of care.

## 2021-09-22 NOTE — Progress Notes (Signed)
[facility]    Provider:  Dr Lauren Bradley Requesting Provider: Lowell Guitar, MD Primary Care Provider:  Pcp, Bradley  CC:  Chronic migraines  HPI:  Lauren Bradley is a 71 y.o. female here as requested by Lauren Guitar, MD for migraines. PMHx arthritis, osteopenia, migraines, depression, upper airway cough disease, remote B12 deficiency on supplementation, vitamin D def on supplementation, fibromyalgia. She goes to an integrative doctor, she has 2 copies of MHTR and takes supplements for that methylfolate. Patient has had migraines for decades, saw Lauren Bradley and then Lauren Bradley and then Lauren Bradley who is Bradley longer in [place]. Tried multiple medications, a plethora of migraine medications. Very rarely has an aura with blocks, she takes magnesium. She wakes up with migraines, she did had a sleep test, she had a sleep test with Lauren Bradley and did not have OSA or other apnea necessitating cpap. Prior to having botox, she had 25 migraine days a month without aura, moderately to severe, would last 24-72 hours untreated, photophobia/phonophobia, smells would trigger, light was the worst, +nausea, hurts to move, a dark room helps, botox has significantly improved her life, changed her life, she has been having botox for years and continues to improve quality of life, Bradley medication overuse, she only gets 4 migraines a month and < 10 total headache days a month. Never tried nurtec, ubrelvy, zavzpret. She can tell her headache is coming on, progresses slowly. Last botox was 08/16/2021. Bradley other focal neurologic deficits, associated symptoms, inciting events or modifiable factors. Had imagig in the past reportedly normal by patient and since she is so improved Bradley indication for imaging at this time Bradley vision changes, exertional headaches, morning headaches ongoing for decades and not changing. She is apatient of Lauren Bradley for her CTS and cervical degenerative disease.  Reviewed notes, labs and imaging  from outside physicians, which showed:   Cbc with mild anemia, cmp unremarkable, b12 is elevated which is fine she take supplemenation.  Reviewed Lauren Bradley notes who is a neurologist from [place] as below, meds tried:   Current and past medications: ANALGESICS: Tylenol/Acetaminophen.  Anti-Migraine: Relpax. Imitrex. maxalt Decongestant/Antihistamine: Allegra/Fexofenadine, Clarinex/Claritin, Dramamine/Dimenhydrinate, sudafed/Pseudoephedrine and Zyrtec/Cetirizine.  Anti-Nauseant: Phenergan/Promethazine.  NSAID's: Advil/Motrin/Ibuprofen.  Muscle Relaxants: Flexeril/Cyclobenzaprine, skelaxin Anti-Convulsants: Depakote/Valproic/Divalproex. Gabapentin, gralise, Topirmate Steriods: Prednisone.  Sleeping Pills/Tranquilizers: Ambien/Cr/Zolipem.  Anti-Depressants: Desipramine/Norpramine, Effexor/Venlafaxine, Paxil/Preva/Paroxetine, Wellbutrin/Buproprion and Zoloft/Sertraline. Cymbalta Herbal: Coenzyme Q10, Feverfew and Magnesium.  BP meds: atenolol; propranolol contraindicated due to asthma (getting worked up for a cough, chronic cough) Other medications/therapies: Trigger Point injection. Botox   Review of Systems: Patient complains of symptoms per HPI as well as the following symptoms migraines. Pertinent negatives and positives per HPI. All others negative.   Social History   Socioeconomic History   Marital status: Single    Spouse name: Not on file   Number of children: Not on file   Years of education: Not on file   Highest education level: Not on file  Occupational History   Not on file  Tobacco Use   Smoking status: Never   Smokeless tobacco: Never  Vaping Use   Vaping Use: Never used  Substance and Sexual Activity   Alcohol use: Not Currently   Drug use: Bradley   Sexual activity: Not on file  Other Topics Concern   Not on file  Social History Narrative   Not on file   Social Determinants of Health   Financial Resource Strain: Not on file  Food Insecurity: Not on file  Transportation Needs: Not on file  Physical Activity: Not on file  Stress: Not on file  Social Connections: Not on file  Intimate Partner Violence: Not on file    Family History  Problem Relation Age of Onset   Fibromyalgia Mother    Kidney failure Mother    Mental illness Mother    Migraines Mother    Heart disease Father    COPD Father    Cancer Father        colon, liver    Migraines Paternal Aunt    Hypothyroidism Son     Past Medical History:  Diagnosis Date   Asthma    DDD (degenerative disc disease), cervical    DDD (degenerative disc disease), lumbar    Fatigue    Fibromyalgia    Infertility, female    Osteoarthritis     Patient Active Problem List   Diagnosis Date Noted   Upper airway cough syndrome 01/15/2021   Major depressive disorder, single episode, severe ([facility]) 10/06/2017   History of vitamin D deficiency 10/12/2016   History of seasonal allergies 12/29/2015   Fibromyalgia 12/29/2015   Other fatigue 12/29/2015   Primary osteoarthritis of both hands 12/29/2015   Primary osteoarthritis of both feet 12/29/2015   DJD (degenerative joint disease), cervical 12/29/2015   DDD (degenerative disc disease), lumbar 12/29/2015   Osteopenia of multiple sites 12/29/2015   History of migraine 12/29/2015    Past Surgical History:  Procedure Laterality Date   CESAREAN SECTION     FOOT SURGERY     GALLBLADDER SURGERY     KNEE SURGERY      Current Outpatient Medications  Medication Sig Dispense Refill   albuterol (VENTOLIN HFA) 108 (90 Base) MCG/ACT inhaler Inhale 2 puffs into the lungs every 6 (six) hours as needed for wheezing or shortness of breath.     ALPRAZolam (XANAX) 0.5 MG tablet 2 tablets at night and 1 daily as needed for anxierty (Patient taking differently: 0.25 mg at bedtime. 1/2 tab at bedtime) 75 tablet 4   ARIPiprazole (ABILIFY) 5 MG tablet TAKE ONE TABLET BY MOUTH ONCE DAILY 90 tablet 0   azelastine (ASTELIN) 0.1 % nasal spray      calcium  carbonate (OS-CAL) 600 MG TABS tablet Take 600 mg by mouth.     DULoxetine (CYMBALTA) 30 MG capsule Take 3 capsules (90 mg total) by mouth daily. 270 capsule 0   EPINEPHrine (EPIPEN JR) 0.15 MG/0.3ML injection      estradiol (VIVELLE-DOT) 0.075 MG/24HR estradiol 0.075 mg/24 hr semiweekly transdermal patch  APPLY 1 PATCH TO SKIN TWICE WEEKLY AS DIRECTED     famotidine (PEPCID) 20 MG tablet One after supper 30 tablet 11   Levomefolate Glucosamine (METHYLFOLATE PO) Take by mouth.     lithium carbonate 150 MG capsule Take 1 capsule (150 mg total) by mouth daily. 90 capsule 3   MAGNESIUM MALATE PO Take by mouth daily.     modafinil (PROVIGIL) 100 MG tablet TAKE 1 AND 1/2 TABLETS BY MOUTH EVERY MORNING and TAKE 1/2 TABLET BY MOUTH AT NOON 60 tablet 3   NP THYROID 15 MG tablet Take 15 mg by mouth daily.     ondansetron (ZOFRAN-ODT) 4 MG disintegrating tablet Take 1-2 tablets (4-8 mg total) by mouth every 8 (eight) hours as needed. 30 tablet 3   progesterone (PROMETRIUM) 200 MG capsule Take by mouth.     thyroid (ARMOUR) 90 MG tablet TAKE 1 TABLET BY MOUTH EVERY DAY ON AN EMPTY STOMACH  WITH 15 MG DOSE     tretinoin (RETIN-A) 0.025 % cream      Ubrogepant (UBRELVY) 100 MG TABS Take 100 mg by mouth every 2 (two) hours as needed. Maximum '200mg'$  a day. 16 tablet 11   Vitamin D, Ergocalciferol, (DRISDOL) 50000 units CAPS capsule Take 50,000 Units by mouth every 7 (seven) days.      SUMAtriptan (IMITREX) 100 MG tablet as needed.     Bradley current facility-administered medications for this visit.    Allergies as of 09/22/2021 - Review Complete 09/22/2021  Allergen Reaction Noted   Hydrocodone Itching 08/01/2012   Codeine Nausea And Vomiting 09/14/2012   Other     Sulfa antibiotics  08/12/2011   Cephalosporins Rash 10/21/2010   Sulfamethoxazole Rash 10/21/2010    Vitals: BP 119/80   Pulse 91   Ht '5\' 3"'$  (1.6 m)   Wt 98 lb (44.5 kg)   BMI 17.36 kg/m  Last Weight:  Wt Readings from Last 1  Encounters:  09/22/21 98 lb (44.5 kg)   Last Height:   Ht Readings from Last 1 Encounters:  09/22/21 '5\' 3"'$  (1.6 m)     Physical exam: Exam: Gen: NAD, conversant, well nourised, well groomed                     CV: RRR, Bradley MRG. Bradley Carotid Bruits. Bradley peripheral edema, warm, nontender Eyes: Conjunctivae clear without exudates or hemorrhage  Neuro: Detailed Neurologic Exam  Speech:    Speech is normal; fluent and spontaneous with normal comprehension.  Cognition:    The patient is oriented to person, place, and time;     recent and remote memory intact;     language fluent;     normal attention, concentration,     fund of knowledge Cranial Nerves:    The pupils are equal, round, and reactive to light. The fundi are flat. Visual fields are full to finger confrontation. Extraocular movements are intact. Trigeminal sensation is intact and the muscles of mastication are normal. The face is symmetric. The palate elevates in the midline. Hearing intact. Voice is normal. Shoulder shrug is normal. The tongue has normal motion without fasciculations.   Coordination:    Normal  Gait:    normal.   Motor Observation:    Bradley asymmetry, Bradley atrophy, and Bradley involuntary movements noted. Tone:    Normal muscle tone.    Posture:    Posture is normal. normal erect    Strength:    Strength is V/V in the upper and lower limbs.      Sensation: intact to LT     Reflex Exam:  DTR's: right orthotic, deferred.     Deep tendon reflexes in the upper and lower extremities are normal bilaterally.   Toes:    The toes are downgoing bilaterally.   Clonus:    Clonus is absent.    Assessment/Plan:  Patient with chronic migraines. She wakes up with headaches sometimes, she did had a sleep test, she had a sleep test with Lauren Bradley and did not have OSA or other apnea necessitating cpap. Prior to having botox, she had 25 migraine days a month without aura(rarely will she have an aura), moderately to  severe, would last 24-72 hours untreated, photophobia/phonophobia, smells would trigger, light was the worst, botox has significantly improved her life, changed her life, she has been having botox for years and continues to improve quality of life, Bradley medication overuse, she only gets 4  migraines a month and < 10 total headache days a month now with botox. Never tried nurtec, ubrelvy, zavzpret. She can tell her headache is coming on, progresses slowly. Last botox was 08/16/2021. Bradley other focal neurologic deficits, associated symptoms, inciting events or modifiable factors. Had imagig in the past reportedly normal by patient and since she is so improved Bradley indication for imaging at this time Bradley vision changes, exertional headaches, morning headaches ongoing for decades and not changing. Discussed new medications, other strategies for acute management.   - transition botox. TOC  - She is a patient of Lauren Bradley for her CTS and cervical degenerative disease.  - Tru Ubrelvy acutely.    Bradley orders of the defined types were placed in this encounter.  Meds ordered this encounter  Medications   Ubrogepant (UBRELVY) 100 MG TABS    Sig: Take 100 mg by mouth every 2 (two) hours as needed. Maximum '200mg'$  a day.    Dispense:  16 tablet    Refill:  11   ondansetron (ZOFRAN-ODT) 4 MG disintegrating tablet    Sig: Take 1-2 tablets (4-8 mg total) by mouth every 8 (eight) hours as needed.    Dispense:  30 tablet    Refill:  3    Cc: Goforth, Arrie Aran, MD,  Lauren Bradley  Sarina Ill, MD  [facility] [address] [place], [place] [address]  Phone [phone] Fax [phone]  I spent 60 minutes of face-to-face and non-face-to-face time with patient on the  1. Chronic migraine without aura without status migrainosus, not intractable   2. Migraine with aura and without status migrainosus, not intractable    diagnosis.  This included previsit chart review, lab review, study  review, order entry, electronic health record documentation, patient education on the different diagnostic and therapeutic options, counseling and coordination of care, risks and benefits of management, compliance, or risk factor reduction

## 2021-09-22 NOTE — Patient Instructions (Addendum)
Newer as needed meds include: Nurtec, Alinda Sierras which can be used acutely and so you could get approved if needed Newer preventatives: Ajovy, Emgality (I don;t like Aimovig), qulipta, Nurtec can be used preventatively and acutely Prescribe Ubrelvy: Take RIGHT at onset. Can take it alone or with triptans or ondansetron(nausea) if needed.  Ondansetron for nausea if needed alone   There is increased risk for stroke in women with migraine with aura and a contraindication for the combined contraceptive pill for use by women who have migraine with aura. The risk for women with migraine without aura is lower. However other risk factors like smoking are far more likely to increase stroke risk than migraine. There is a recommendation for no smoking and for the use of OCPs without estrogen such as progestogen only pills particularly for women with migraine with aura.Marland Kitchen People who have migraine headaches with auras may be 3 times more likely to have a stroke caused by a blood clot, compared to migraine patients who don't see auras. Women who take hormone-replacement therapy may be 30 percent more likely to suffer a clot-based stroke than women not taking medication containing estrogen. Other risk factors like smoking and high blood pressure may be  much more important.  Meds ordered this encounter  Medications   Ubrogepant (UBRELVY) 100 MG TABS    Sig: Take 100 mg by mouth every 2 (two) hours as needed. Maximum '200mg'$  a day.    Dispense:  16 tablet    Refill:  11   ondansetron (ZOFRAN-ODT) 4 MG disintegrating tablet    Sig: Take 1-2 tablets (4-8 mg total) by mouth every 8 (eight) hours as needed.    Dispense:  30 tablet    Refill:  3    Ubrogepant Tablets What is this medication? UBROGEPANT (ue BROE je pant) treats migraines. It works by blocking a substance in the body that causes migraines. It is not used to prevent migraines. This medicine may be used for other purposes; ask your health care  provider or pharmacist if you have questions. COMMON BRAND NAME(S): Roselyn Meier What should I tell my care team before I take this medication? They need to know if you have any of these conditions: Kidney disease Liver disease An unusual or allergic reaction to ubrogepant, other medications, foods, dyes, or preservatives Pregnant or trying to get pregnant Breast-feeding How should I use this medication? Take this medication by mouth with a glass of water. Take it as directed on the prescription label. You can take it with or without food. If it upsets your stomach, take it with food. Keep taking it unless your care team tells you to stop. Talk to your care team about the use of this medication in children. Special care may be needed. Overdosage: If you think you have taken too much of this medicine contact a poison control center or emergency room at once. NOTE: This medicine is only for you. Do not share this medicine with others. What if I miss a dose? This does not apply. This medication is not for regular use. What may interact with this medication? Do not take this medication with any of the following: Adagrasib Ceritinib Certain antibiotics, such as chloramphenicol, clarithromycin, telithromycin Certain antivirals for HIV, such as atazanavir, cobicistat, darunavir, delavirdine, fosamprenavir, indinavir, ritonavir Certain medications for fungal infections, such as itraconazole, ketoconazole, posaconazole, voriconazole Conivaptan Grapefruit Idelalisib Mifepristone Nefazodone Ribociclib This medication may also interact with the following: Carvedilol Certain medications for seizures, such as phenobarbital, phenytoin Ciprofloxacin  Cyclosporine Eltrombopag Fluconazole Fluvoxamine Quinidine Rifampin St. John's wort Verapamil This list may not describe all possible interactions. Give your health care provider a list of all the medicines, herbs, non-prescription drugs, or dietary  supplements you use. Also tell them if you smoke, drink alcohol, or use illegal drugs. Some items may interact with your medicine. What should I watch for while using this medication? Visit your care team for regular checks on your progress. Tell your care team if your symptoms do not start to get better or if they get worse. Your mouth may get dry. Chewing sugarless gum or sucking hard candy and drinking plenty of water may help. Contact your care team if the problem does not go away or is severe. What side effects may I notice from receiving this medication? Side effects that you should report to your care team as soon as possible: Allergic reactions--skin rash, itching, hives, swelling of the face, lips, tongue, or throat Side effects that usually do not require medical attention (report to your care team if they continue or are bothersome): Drowsiness Dry mouth Fatigue Nausea This list may not describe all possible side effects. Call your doctor for medical advice about side effects. You may report side effects to FDA at 1-800-FDA-1088. Where should I keep my medication? Keep out of the reach of children and pets. Store between 15 and 30 degrees C (59 and 86 degrees F). Get rid of any unused medication after the expiration date. To get rid of medications that are no longer needed or have expired: Take the medication to a medication take-back program. Check with your pharmacy or law enforcement to find a location. If you cannot return the medication, check the label or package insert to see if the medication should be thrown out in the garbage or flushed down the toilet. If you are not sure, ask your care team. If it is safe to put it in the trash, pour the medication out of the container. Mix the medication with cat litter, dirt, coffee grounds, or other unwanted substance. Seal the mixture in a bag or container. Put it in the trash. NOTE: This sheet is a summary. It may not cover all possible  information. If you have questions about this medicine, talk to your doctor, pharmacist, or health care provider.  2023 Elsevier/Gold Standard (2021-01-09 00:00:00)

## 2021-09-22 NOTE — Telephone Encounter (Signed)
-----   Message from Melvenia Beam, MD sent at 09/22/2021  1:39 PM EDT ----- Regarding: initiate botox for migraines Initiate botox for migraines thanks, transfor of care from [facility]

## 2021-09-23 ENCOUNTER — Telehealth (HOSPITAL_COMMUNITY): Payer: Self-pay | Admitting: Pharmacy Technician

## 2021-09-23 ENCOUNTER — Other Ambulatory Visit (HOSPITAL_COMMUNITY): Payer: Self-pay

## 2021-09-23 ENCOUNTER — Other Ambulatory Visit: Payer: Self-pay | Admitting: Psychiatry

## 2021-09-23 ENCOUNTER — Telehealth: Payer: Self-pay | Admitting: *Deleted

## 2021-09-23 NOTE — Telephone Encounter (Signed)
From the Rx prior auth team:   Prior Authorization for Botox 200UNIT solution has been approved.     PA# [id number]  Key: [id number]  Effective dates: 09/23/2021 through 01/10/2022   Can be filled at [facility]

## 2021-09-23 NOTE — Telephone Encounter (Signed)
I called [facility] and spoke with Jonelle Sidle. She will onboard the patient so the Botox shipment can be arranged.

## 2021-09-23 NOTE — Telephone Encounter (Signed)
Patient Advocate Encounter  Prior Authorization for Botox 200UNIT solution has been approved.    PA# [id number] Key: [id number] Effective dates: 09/23/2021 through 01/10/2022  Can be filled at [facility] [place] [facility] [place] Patient Advocate Team Direct Number: [phone]  Fax: [phone]

## 2021-09-23 NOTE — Telephone Encounter (Signed)
Completed Roselyn Meier PA on Cover My Meds. Key: [id number]. Awaiting determination from [facility].

## 2021-09-23 NOTE — Telephone Encounter (Signed)
Received fax from [facility]. Lauren Bradley has been approved through 01/10/22. I faxed the approval letter to the pharmacy. Received a receipt of confirmation.

## 2021-09-24 ENCOUNTER — Other Ambulatory Visit (HOSPITAL_COMMUNITY): Payer: Self-pay

## 2021-09-28 ENCOUNTER — Other Ambulatory Visit (HOSPITAL_COMMUNITY): Payer: Self-pay

## 2021-09-28 DIAGNOSIS — R490 Dysphonia: Secondary | ICD-10-CM | POA: Diagnosis not present

## 2021-09-28 DIAGNOSIS — R059 Cough, unspecified: Secondary | ICD-10-CM | POA: Diagnosis not present

## 2021-09-28 DIAGNOSIS — R053 Chronic cough: Secondary | ICD-10-CM | POA: Diagnosis not present

## 2021-09-28 DIAGNOSIS — K219 Gastro-esophageal reflux disease without esophagitis: Secondary | ICD-10-CM | POA: Diagnosis not present

## 2021-09-28 DIAGNOSIS — J384 Edema of larynx: Secondary | ICD-10-CM | POA: Diagnosis not present

## 2021-09-28 DIAGNOSIS — J385 Laryngeal spasm: Secondary | ICD-10-CM | POA: Diagnosis not present

## 2021-09-28 NOTE — Telephone Encounter (Signed)
Patient's copay through prescription insurance is over $500. Buy and bill will be cheaper for patient- Medicare and BCBS Supplement will cover patients procedure, after she meets her Medicare deductible.  Awaiting final Botox one benefits.

## 2021-09-30 ENCOUNTER — Other Ambulatory Visit (HOSPITAL_COMMUNITY): Payer: Self-pay

## 2021-09-30 NOTE — Telephone Encounter (Signed)
Tamra w/ the [facility] SP called. She stated the pt told her she has never paid a co-pay in the past for Botox and she has two insurances. Tamra said we would need to split bill the Botox.    Obtained from other phone note in chart:  Beatriz Chancellor, CPhT    09/28/21  1:27 PM Note Patient's copay through prescription insurance is over $500. Buy and bill will be cheaper for patient- Medicare and BCBS Supplement will cover patients procedure, after she meets her Medicare deductible.   Awaiting final Botox one benefits.

## 2021-09-30 NOTE — Telephone Encounter (Signed)
Noted. Info given to [facility] billing dept.

## 2021-09-30 NOTE — Telephone Encounter (Signed)
Procedure has more coverage through Office Buy/Bill, versus the pharmacy benefit.  Medicare covers 80% of the procedure and no authorization is required, and the supplement would cover the 20% of the cost that was not paid for by Medicare as long as Medicare covered the medication?after patient pays her $75 Medicare B deductible?Marland Kitchen

## 2021-10-05 ENCOUNTER — Other Ambulatory Visit: Payer: Self-pay | Admitting: Neurology

## 2021-10-05 NOTE — Telephone Encounter (Signed)
Spoke with patient. She is going to call her insurance and see what is preferred/cheaper compared to the [place]. She will get back to [place]. I also presented option of applying for the patient savings program with [facility] assist. Pt will look into this. I sent her a link through mychart.

## 2021-10-05 NOTE — Telephone Encounter (Signed)
Pt would like call back from nurse regarding Lauren Bradley (too expensive) and would like to know what else she can try. Also stated prior office billed only MCR/BCBS for Botox not [employer]. Best call back [phone]

## 2021-10-06 ENCOUNTER — Ambulatory Visit: Payer: Medicare Other | Admitting: Neurology

## 2021-10-09 DIAGNOSIS — J301 Allergic rhinitis due to pollen: Secondary | ICD-10-CM | POA: Diagnosis not present

## 2021-10-09 DIAGNOSIS — J3081 Allergic rhinitis due to animal (cat) (dog) hair and dander: Secondary | ICD-10-CM | POA: Diagnosis not present

## 2021-10-09 DIAGNOSIS — J3089 Other allergic rhinitis: Secondary | ICD-10-CM | POA: Diagnosis not present

## 2021-10-12 ENCOUNTER — Other Ambulatory Visit: Payer: Self-pay

## 2021-10-12 MED ORDER — SUMATRIPTAN SUCCINATE 100 MG PO TABS
ORAL_TABLET | ORAL | 0 refills | Status: DC
Start: 2021-10-12 — End: 2021-11-11

## 2021-10-12 NOTE — Addendum Note (Signed)
Addended by: Brandon Melnick on: 10/12/2021 01:39 PM   Modules accepted: Orders

## 2021-10-12 NOTE — Telephone Encounter (Signed)
Pt is asking to be called re: the Botox being filled thru [facility] and not [employer]

## 2021-10-12 NOTE — Telephone Encounter (Signed)
I have intiated a tier reduction for ubrelvy on CMM KEY [id number].

## 2021-10-12 NOTE — Telephone Encounter (Signed)
Pt states she checked around for other medications outside of [facility], she has not been able to find anything under tier 4.  Pt states her previous provider had her on Max-salt which did not help much.  Pt would like to discuss going back on Sumitraiptan, it helped much more than Max-salt and she knows that it is covered, please call to discuss.

## 2021-10-13 ENCOUNTER — Telehealth: Payer: Self-pay | Admitting: *Deleted

## 2021-10-13 NOTE — Telephone Encounter (Signed)
Tier reduction for Lauren Bradley was denied.

## 2021-10-13 NOTE — Telephone Encounter (Signed)
CMM initiated KEY [id number] for UBRELVY '100mg'$  tabs. Determination 1-6 days.

## 2021-10-14 ENCOUNTER — Ambulatory Visit: Payer: Medicare Other | Admitting: Neurology

## 2021-10-21 DIAGNOSIS — J3081 Allergic rhinitis due to animal (cat) (dog) hair and dander: Secondary | ICD-10-CM | POA: Diagnosis not present

## 2021-10-21 DIAGNOSIS — J3089 Other allergic rhinitis: Secondary | ICD-10-CM | POA: Diagnosis not present

## 2021-10-30 DIAGNOSIS — R350 Frequency of micturition: Secondary | ICD-10-CM | POA: Diagnosis not present

## 2021-11-04 DIAGNOSIS — J3089 Other allergic rhinitis: Secondary | ICD-10-CM | POA: Diagnosis not present

## 2021-11-04 DIAGNOSIS — J3081 Allergic rhinitis due to animal (cat) (dog) hair and dander: Secondary | ICD-10-CM | POA: Diagnosis not present

## 2021-11-04 DIAGNOSIS — J301 Allergic rhinitis due to pollen: Secondary | ICD-10-CM | POA: Diagnosis not present

## 2021-11-11 ENCOUNTER — Ambulatory Visit (INDEPENDENT_AMBULATORY_CARE_PROVIDER_SITE_OTHER): Payer: Medicare Other | Admitting: Neurology

## 2021-11-11 DIAGNOSIS — J3081 Allergic rhinitis due to animal (cat) (dog) hair and dander: Secondary | ICD-10-CM | POA: Diagnosis not present

## 2021-11-11 DIAGNOSIS — G43709 Chronic migraine without aura, not intractable, without status migrainosus: Secondary | ICD-10-CM | POA: Diagnosis not present

## 2021-11-11 DIAGNOSIS — J3089 Other allergic rhinitis: Secondary | ICD-10-CM | POA: Diagnosis not present

## 2021-11-11 DIAGNOSIS — J301 Allergic rhinitis due to pollen: Secondary | ICD-10-CM | POA: Diagnosis not present

## 2021-11-11 MED ORDER — ONABOTULINUMTOXINA 100 UNITS IJ SOLR
155.0000 [IU] | Freq: Once | INTRAMUSCULAR | Status: AC
  Administered 2021-11-11: 155 [IU] via INTRAMUSCULAR

## 2021-11-11 MED ORDER — SUMATRIPTAN SUCCINATE 100 MG PO TABS: ORAL_TABLET | ORAL | 11 refills | Status: DC

## 2021-11-11 NOTE — Progress Notes (Signed)
Consent Form Botulism Toxin Injection For Chronic Migraine  11/11/2021 Transfer of care from Dr. Ninfa Meeker. she had 25 migraine days a month without aura(rarely will she have an aura), moderately to severe, would last 24-72 hours untreated, photophobia/phonophobia, smells would trigger, light was the worst, botox has significantly improved her life, changed her life, she has been having botox for years and continues to improve quality of life, no medication overuse, she only gets 4 migraines a month and < 10 total headache days a month now with botox.  Gave her info on Tecol[employer].   Reviewed orally with patient, additionally signature is on file:  Botulism toxin has been approved by the Federal drug administration for treatment of chronic migraine. Botulism toxin does not cure chronic migraine and it may not be effective in some patients.  The administration of botulism toxin is accomplished by injecting a small amount of toxin into the muscles of the neck and head. Dosage must be titrated for each individual. Any benefits resulting from botulism toxin tend to wear off after 3 months with a repeat injection required if benefit is to be maintained. Injections are usually done every 3-4 months with maximum effect peak achieved by about 2 or 3 weeks. Botulism toxin is expensive and you should be sure of what costs you will incur resulting from the injection.  The side effects of botulism toxin use for chronic migraine may include:   -Transient, and usually mild, facial weakness with facial injections  -Transient, and usually mild, head or neck weakness with head/neck injections  -Reduction or loss of forehead facial animation due to forehead muscle weakness  -Eyelid drooping  -Dry eye  -Pain at the site of injection or bruising at the site of injection  -Double vision  -Potential unknown long term risks  Contraindications: You should not have Botox if you are pregnant, nursing, allergic to albumin,  have an infection, skin condition, or muscle weakness at the site of the injection, or have myasthenia gravis, Lambert-Eaton syndrome, or ALS.  It is also possible that as with any injection, there may be an allergic reaction or no effect from the medication. Reduced effectiveness after repeated injections is sometimes seen and rarely infection at the injection site may occur. All care will be taken to prevent these side effects. If therapy is given over a long time, atrophy and wasting in the muscle injected may occur. Occasionally the patient's become refractory to treatment because they develop antibodies to the toxin. In this event, therapy needs to be modified.  I have read the above information and consent to the administration of botulism toxin.    BOTOX PROCEDURE NOTE FOR MIGRAINE HEADACHE    Contraindications and precautions discussed with patient(above). Aseptic procedure was observed and patient tolerated procedure. Procedure performed by Dr. Georgia Dom  The condition has existed for more than 6 months, and pt does not have a diagnosis of ALS, Myasthenia Gravis or Lambert-Eaton Syndrome.  Risks and benefits of injections discussed and pt agrees to proceed with the procedure.  Written consent obtained  These injections are medically necessary. Pt  receives good benefits from these injections. These injections do not cause sedations or hallucinations which the oral therapies may cause.  Description of procedure:  The patient was placed in a sitting position. The standard protocol was used for Botox as follows, with 5 units of Botox injected at each site:   -Procerus muscle, midline injection  -Corrugator muscle, bilateral injection  -Frontalis muscle, bilateral injection, with  2 sites each side, medial injection was performed in the upper one third of the frontalis muscle, in the region vertical from the medial inferior edge of the superior orbital rim. The lateral injection was  again in the upper one third of the forehead vertically above the lateral limbus of the cornea, 1.5 cm lateral to the medial injection site.  -Temporalis muscle injection, 4 sites, bilaterally. The first injection was 3 cm above the tragus of the ear, second injection site was 1.5 cm to 3 cm up from the first injection site in line with the tragus of the ear. The third injection site was 1.5-3 cm forward between the first 2 injection sites. The fourth injection site was 1.5 cm posterior to the second injection site.   -Occipitalis muscle injection, 3 sites, bilaterally. The first injection was done one half way between the occipital protuberance and the tip of the mastoid process behind the ear. The second injection site was done lateral and superior to the first, 1 fingerbreadth from the first injection. The third injection site was 1 fingerbreadth superiorly and medially from the first injection site.  -Cervical paraspinal muscle injection, 2 sites, bilateral knee first injection site was 1 cm from the midline of the cervical spine, 3 cm inferior to the lower border of the occipital protuberance. The second injection site was 1.5 cm superiorly and laterally to the first injection site.  -Trapezius muscle injection was performed at 3 sites, bilaterally. The first injection site was in the upper trapezius muscle halfway between the inflection point of the neck, and the acromion. The second injection site was one half way between the acromion and the first injection site. The third injection was done between the first injection site and the inflection point of the neck.   Will return for repeat injection in 3 months.   200 units of Botox was used, any Botox not injected was wasted. The patient tolerated the procedure well, there were no complications of the above procedure.

## 2021-11-12 ENCOUNTER — Other Ambulatory Visit: Payer: Self-pay | Admitting: Psychiatry

## 2021-11-12 DIAGNOSIS — F331 Major depressive disorder, recurrent, moderate: Secondary | ICD-10-CM

## 2021-11-13 DIAGNOSIS — M533 Sacrococcygeal disorders, not elsewhere classified: Secondary | ICD-10-CM | POA: Diagnosis not present

## 2021-11-15 ENCOUNTER — Other Ambulatory Visit: Payer: Self-pay | Admitting: Psychiatry

## 2021-11-15 DIAGNOSIS — F900 Attention-deficit hyperactivity disorder, predominantly inattentive type: Secondary | ICD-10-CM

## 2021-11-15 DIAGNOSIS — G4710 Hypersomnia, unspecified: Secondary | ICD-10-CM

## 2021-11-19 ENCOUNTER — Other Ambulatory Visit: Payer: Self-pay | Admitting: Psychiatry

## 2021-11-19 DIAGNOSIS — F331 Major depressive disorder, recurrent, moderate: Secondary | ICD-10-CM

## 2021-11-20 ENCOUNTER — Other Ambulatory Visit: Payer: Self-pay | Admitting: Psychiatry

## 2021-11-20 DIAGNOSIS — F331 Major depressive disorder, recurrent, moderate: Secondary | ICD-10-CM

## 2021-11-23 ENCOUNTER — Telehealth: Payer: Self-pay | Admitting: Psychiatry

## 2021-11-23 DIAGNOSIS — F411 Generalized anxiety disorder: Secondary | ICD-10-CM

## 2021-11-24 ENCOUNTER — Other Ambulatory Visit: Payer: Self-pay

## 2021-11-24 DIAGNOSIS — J301 Allergic rhinitis due to pollen: Secondary | ICD-10-CM | POA: Diagnosis not present

## 2021-11-24 DIAGNOSIS — J3089 Other allergic rhinitis: Secondary | ICD-10-CM | POA: Diagnosis not present

## 2021-11-24 DIAGNOSIS — F331 Major depressive disorder, recurrent, moderate: Secondary | ICD-10-CM

## 2021-11-24 DIAGNOSIS — J3081 Allergic rhinitis due to animal (cat) (dog) hair and dander: Secondary | ICD-10-CM | POA: Diagnosis not present

## 2021-11-24 MED ORDER — ARIPIPRAZOLE 5 MG PO TABS: 5.0000 mg | ORAL_TABLET | Freq: Every day | ORAL | 0 refills | Status: DC

## 2021-11-24 NOTE — Telephone Encounter (Signed)
Pt lvm that she doesn't recall picking up her abilify.She would like someone to call her back about it. Her number is [phone]

## 2021-11-26 DIAGNOSIS — Z23 Encounter for immunization: Secondary | ICD-10-CM | POA: Diagnosis not present

## 2021-11-27 DIAGNOSIS — R490 Dysphonia: Secondary | ICD-10-CM | POA: Diagnosis not present

## 2021-12-09 ENCOUNTER — Ambulatory Visit (INDEPENDENT_AMBULATORY_CARE_PROVIDER_SITE_OTHER): Payer: Medicare Other | Admitting: Psychiatry

## 2021-12-09 ENCOUNTER — Encounter: Payer: Self-pay | Admitting: Psychiatry

## 2021-12-09 DIAGNOSIS — F331 Major depressive disorder, recurrent, moderate: Secondary | ICD-10-CM | POA: Diagnosis not present

## 2021-12-09 DIAGNOSIS — F411 Generalized anxiety disorder: Secondary | ICD-10-CM | POA: Diagnosis not present

## 2021-12-09 DIAGNOSIS — F900 Attention-deficit hyperactivity disorder, predominantly inattentive type: Secondary | ICD-10-CM

## 2021-12-09 DIAGNOSIS — F4010 Social phobia, unspecified: Secondary | ICD-10-CM

## 2021-12-09 DIAGNOSIS — G4710 Hypersomnia, unspecified: Secondary | ICD-10-CM | POA: Diagnosis not present

## 2021-12-09 NOTE — Progress Notes (Signed)
Lauren Bradley [id number] 10/14/1950 71 y.o.   Subjective:   Patient ID:  Lauren Bradley is a 71 y.o. (DOB 23-Jan-1950) female.  Chief Complaint:  Chief Complaint  Patient presents with   Follow-up   Depression   ADHD   Anxiety    Depression        Associated symptoms include fatigue, myalgias and headaches.  Associated symptoms include no decreased concentration and no suicidal ideas.  Past medical history includes anxiety.   Anxiety Symptoms include nervous/anxious behavior. Patient reports no confusion, decreased concentration, dizziness, palpitations, shortness of breath or suicidal ideas.     Lauren Bradley presents  today for follow-up of depression, anxiety and insomnia and chronic pain.   At visit Jun 08, 2018.  No major meds were changed except she was trying to gradually increase the Abilify to 2 mg daily for optimal mood effect.  I feel much better on the Abilify.  Makes my brain feel more normal and focus is better and calmer overall.   visit August 31, 2018.  She had asked to increase Abilify from 2 to 3 mg daily.  She had seen Abilify benefit at 2 mg but wished for additional benefit for mood and anxiety.  visit November 2020.  The following was noted: Has done well on Abilify 3 mg with additional improvement in mood, but still has a lot of anxiety.  [name] noticed the difference in her mood.  Managed the weight and lost to baseline.  Wonders about the increase to 5 mg to help anxiety.  Benefit for energy which is reasonable with less napping than before Abilify.  Took half Provigil last week when driving distance and did ok with it.   Helped ADHD.  Function is better.  Still some days flounder.  Not as good as paperwork years ago but better with Abilify.  Helped having [name] at home.  Was poor function before Abilify despite trying hard. Abilify helped the depression clearly and very beneficial.  Not always sad now.  HA resolved.  Initially it caused great energy  like a motor running and reduced need for sleep to 5-6 hours.  That has resolved.  SE weight gain without increasing calories.  Changed her sleep schedule for the better.  To bed earlier and up earlier.    I didn't realize how depressed I was.  Better organization and function and productivity.  Abilify helped regulate and correct her sleep wake schedule.  Not oversleeping now. Anxiety remains high chronically.  Can talk too much when anxious and her accountant commented on it to her.  QT has been good for her.  Chronic social anxiety.  Can be crippled from anxiety.   Plan: Trial increase Abilify 5 mg daily in hopes of further reducing anxiety and depressive symptoms.  05/31/2019 appointment the following is noted: Sleep well with naltrexone and alprazolam 0.25 mg HS. Better energy and alertness with modafinil 100 but a little less effect over time.  Some degree of tolerance. Hard to get paperwork done. Done well with aripiprazole 5 mg daily. Helped energy and coping with calmer manner. So much less depressed with Abilify.  Concerns over Hgb A1C which had been borderline in the past and is up a little to 5.9 now.  No extra weight at this point.  Is exercising.    Stressed still dealing with hypochondriac mother with constant somatization and complaining and anxiety and frequently wanting to go to the ER.  Better handling it with  Abilify.  Disc management with this. Mother compulsively and annoyingly call her repeatedly.  Feels shame over the feelings she has about her mother.  Major boundary issues from her mother.  Mother was neglectful in her childhood.  Recognizes some blacked out memories from her childhood.  Asked how to deal with this with mother.  09/11/19 appt with the following noted: Not quite as well with stress from mother bc B doesn't help much. Concerned about Abilify poss effecting glucose.  Did have high AIC in past even before Abilify.  Has to watch carbs.  Plans to see PCP Dr. [name]  again about it. Poor heat tolerance this year.  Wonders if related to modafinil.  Went up to 150 mg and didn't notice a lot of issues.   No SE otherwise.  Is able to do some paperwork more effectively lately.  More pain intolerant as well. No problems with sleep affected by modafinil.  2 dogs sleep with her.  Cough can wake her worse off tramadol.   Lost her dog to pancreatic CA.  Good help with homeopathic vet.  Feels guilty he had fever and she didn't realize it at the time. Plan: no med changes.  Continue Abilfy 5 which helped and increased modafinil to 150. And other meds.  11/21/2019 appointment with the following noted: Stress care of mother and needs help.  Wonders about moving mother in to help financially.  House needs work. Does everything for mother now.    B's son shot himself at her mother's home. Doesn't feel B respects mother and it causes problems. Realized big issues wiith ExH.  [name]'s graduation brought up some of this she thought she had addressed. Plan: no med changes  01/25/2020 appointment with the following noted: Modafinil 150 mg helped daytime sleepiness.   Sleep has been reset somewhat to a more typical sleep pattern.  Able to stay awake driving better now with modafinil.  Abilify helped energy and reset body clock.   Mo is still a handful.  When not deeply depressed she wants to shop.  Focus hard for mother.  Also concerned about paranoia issues with mother. Angst about dx of new skin diagnosis.   Stress ExH with someone known for a long time and upsetting.  Lost 10# over that.  4-5# underweight.   Also uncertainty about whether or not to get a job.  M is very picky eater and difficult to prepare food for her.  M is very time demanding and loses things.   Pt dx with PXE.  Plan further improvement was noted with Abilify increased to 5 mg daily so no med changes today  04/24/2020 appointment with following noted: [name] comes to appt next week.   Disc boundary setting  with mother who can be overwhelming. Disc Questions around Chronic viral illness complications.  She got better with diet changes and weight lifting. Worked on CBT to deal with negative thinking and mother.  Talks with her daily. Hardd to deal with [name]'s GF. Plan no med changes  06/25/2020 appointment with the following noted: Disc concerns about Covid vaccine and questions.  A lot of fatigue. Exercise a lot more.   Stress alimony drops August 1 and worry.  Also with drop in stock market. Causing anxiety.   Gets 7 hours sleep but weekends 8-9 hours.   Keep wanting to wean Xanax.  Disc concerns. Disc relationship concerns around Ex. Not shakey or jittery. Exercise twice daily.  Busy all day. Plan: Increase modafinil  150 mg and 50 mg Noon for hypersomnolence and ADD.    09/17/2020 appt noted: Stress over alimony ending. Some recennt conflict with Ex and son getting drug into the middle of it.  Angry email from Ex H.  Wants advice about how to deal with [name] and respond to the situation.  Extensive discussion over stressors related to family.  Wants to decide to respond in manner that does not hurt her interests. Patient reports stable mood and denies depressed or irritable moods.  .  Patient denies difficulty with sleep initiation or maintenance. Denies appetite disturbance.  Patient reports that energy and motivation have been good.  Patient denies any difficulty with concentration.  Patient denies any suicidal ideation.  11/24/20 appt noted; Botox helps migraine. But worse lately. Poor memory concerning.  Worrying over alimony drop in Sept.   On Abilify 5, duloxetine 90, modafinil 150, Xanax 0.5 mg 2 at night and 1 prn anxiety. Disc concerns over's [name]'s confidence level. Mood is OK.  Does have lunch with friends monthly. Stress with needy mother who calls constantly. Plan no med changes  03/18/21 appt noted: Learning Spanish to help brain health. Disc relationship issues. Still care  taking M and uncle and somewhat for son.  Worries over son. M mentally worse last 3-4 weeks and somatically focused telling people she was going to die. B Liliane Channel took her to [facility]. She was OK. Enjoys exercise daily. Stays busy.  Never bored or lonely.  Don't want to marry. No problems with meds. Chronic issues with Ex being problematic. Plan no med changes  06/03/21 appt noted: Feels best with modafinil.  Sinks about midday.   Still concerns about memory.   She followed through with relationship pursuit, facing her anxiety. Not consuming her.   Stays really busy with things.  Learning Spanish while exercising.   Using Du[employer]ingo. Mood is ok.  Some anxiety over finances.  Chronic stress there. Plan: no med changes  09/08/21 appt noted: Problems with mother.  CO ongoing pain.  Flipped her lid.  Wouldn't eat for awhile.  Became immobile for awhile.  Took to [facility] ER.  DX gastroenteritis.  Pleased with SW there Trude Mcburney.  Got her into rehab in [place] area for 12 hours.   She's back at [facility] with round the clock care sitter.  Which she likes.  She wants someone to be with her all the time chronically. Guilts pt into wanting more care but expensive.   Memory is worse with mother.   [name] is extremely depressed also and really worries her.  She's having to help that too.  He's very stubborn.   Concerns about [name] and suicide risk.   Plan: no med changes  12/10/21 appt noted:  Psych meds: duloxetine 90, Abilify 5, modafinil 150-200 mg AM, lithium 150 mg daily, alprazolam 0.25 mg HS. Disc stress dealing with mother.  Did not do well with less meds.  M dependent on her markedly.  M will not do things for herself.  M is very needy and negative and not insightful.  M keeps her so busy.  Also helping uncle.   She's been doing pretty well.  Mood is OK.  Stress of $ limitation.   No SE issues with meds.  Satisfied with meds. Learning Spanish for her brain.   Exercises on bike  daily. Took part in study.   [name] is less depressed.   Some awakening with coughing  Some social anxiety still significant.  Past Psychiatric Medication Trials: Abilify 3 mg helpful,  duloxetine 90, venlafaxine irritable, buspirone side effects, trycyclic antidepressants, Wellbutrin, Zoloft, Paxil, Modafinil 150, Vyvanse 30, Ritalin, Adderall,   propranolol Belsomra, Lunesta, Ambien, trazodone, alprazolam, doxepin, mirtazapine, clonazepam  Review of Systems:  Review of Systems  Constitutional:  Positive for fatigue.  Respiratory:  Positive for cough. Negative for shortness of breath.   Cardiovascular:  Negative for palpitations.  Musculoskeletal:  Positive for arthralgias, back pain and myalgias. Negative for gait problem.  Skin:  Positive for rash.  Neurological:  Positive for headaches. Negative for dizziness and tremors.  Psychiatric/Behavioral:  Negative for agitation, behavioral problems, confusion, decreased concentration, dysphoric mood, hallucinations, self-injury, sleep disturbance and suicidal ideas. The patient is nervous/anxious. The patient is not hyperactive.     Medications: I have reviewed the patient's current medications.  Current Outpatient Medications  Medication Sig Dispense Refill   albuterol (VENTOLIN HFA) 108 (90 Base) MCG/ACT inhaler Inhale 2 puffs into the lungs every 6 (six) hours as needed for wheezing or shortness of breath.     ALPRAZolam (XANAX) 0.5 MG tablet TAKE TWO TABLETS BY MOUTH AT night AND ONE daily as needed FOR ANXIETY (Patient taking differently: 1/2 tab HS) 75 tablet 4   ARIPiprazole (ABILIFY) 5 MG tablet Take 1 tablet (5 mg total) by mouth daily. 90 tablet 0   azelastine (ASTELIN) 0.1 % nasal spray      botulinum toxin Type A (BOTOX) 200 units injection Provider to inject 155 units intramuscularly into head and neck every 12 weeks. Discard remainder. 1 each 1   calcium carbonate (OS-CAL) 600 MG TABS tablet Take 600 mg by mouth.      DULoxetine (CYMBALTA) 30 MG capsule TAKE THREE CAPSULES BY MOUTH ONCE DAILY 270 capsule 0   EPINEPHrine (EPIPEN JR) 0.15 MG/0.3ML injection      estradiol (VIVELLE-DOT) 0.075 MG/24HR estradiol 0.075 mg/24 hr semiweekly transdermal patch  APPLY 1 PATCH TO SKIN TWICE WEEKLY AS DIRECTED     famotidine (PEPCID) 20 MG tablet One after supper 30 tablet 11   Levomefolate Glucosamine (METHYLFOLATE PO) Take by mouth.     lithium carbonate 150 MG capsule TAKE ONE CAPSULE BY MOUTH ONCE DAILY 90 capsule 3   MAGNESIUM MALATE PO Take by mouth daily.     modafinil (PROVIGIL) 100 MG tablet TAKE 1 AND 1/2 TABLETS BY MOUTH EVERY MORNING and TAKE 1/2 TABLET BY MOUTH AT NOON 60 tablet 3   NP THYROID 15 MG tablet Take 15 mg by mouth daily.     ondansetron (ZOFRAN-ODT) 4 MG disintegrating tablet Take 1-2 tablets (4-8 mg total) by mouth every 8 (eight) hours as needed. 30 tablet 3   progesterone (PROMETRIUM) 200 MG capsule Take by mouth.     SUMAtriptan (IMITREX) 100 MG tablet Take 1 tablet onset migraine repeat in 2 hours if needed (max 2 tabs/24 hours) 9 tablet 11   thyroid (ARMOUR) 90 MG tablet TAKE 1 TABLET BY MOUTH EVERY DAY ON AN EMPTY STOMACH WITH 15 MG DOSE     tretinoin (RETIN-A) 0.025 % cream      Ubrogepant (UBRELVY) 100 MG TABS Take 100 mg by mouth every 2 (two) hours as needed. Maximum '200mg'$  a day. 16 tablet 11   Vitamin D, Ergocalciferol, (DRISDOL) 50000 units CAPS capsule Take 50,000 Units by mouth every 7 (seven) days.      No current facility-administered medications for this visit.    Medication Side Effects: HA stopped with Abilify, weight gain, hungry is odd  Allergies:  Allergies  Allergen Reactions   Hydrocodone Itching   Codeine Nausea And Vomiting   Other    Sulfa Antibiotics    Cephalosporins Rash   Sulfamethoxazole Rash    Unknown reaction    Past Medical History:  Diagnosis Date   Asthma    DDD (degenerative disc disease), cervical    DDD (degenerative disc disease), lumbar     Fatigue    Fibromyalgia    Infertility, female    Osteoarthritis     Family History  Problem Relation Age of Onset   Fibromyalgia Mother    Kidney failure Mother    Mental illness Mother    Migraines Mother    Heart disease Father    COPD Father    Cancer Father        colon, liver    Migraines Paternal Aunt    Hypothyroidism Son     Social History   Socioeconomic History   Marital status: Single    Spouse name: Not on file   Number of children: Not on file   Years of education: Not on file   Highest education level: Not on file  Occupational History   Not on file  Tobacco Use   Smoking status: Never   Smokeless tobacco: Never  Vaping Use   Vaping Use: Never used  Substance and Sexual Activity   Alcohol use: Not Currently   Drug use: No   Sexual activity: Not on file  Other Topics Concern   Not on file  Social History Narrative   Not on file   Social Determinants of Health   Financial Resource Strain: Not on file  Food Insecurity: Not on file  Transportation Needs: Not on file  Physical Activity: Not on file  Stress: Not on file  Social Connections: Not on file  Intimate Partner Violence: Not on file    Past Medical History, Surgical history, Social history, and Family history were reviewed and updated as appropriate.   Please see review of systems for further details on the patient's review from today.   Objective:   Physical Exam:  There were no vitals taken for this visit.  Physical Exam Constitutional:      General: She is not in acute distress. Musculoskeletal:        General: No deformity.  Neurological:     Mental Status: She is alert and oriented to person, place, and time.     Cranial Nerves: No dysarthria.     Coordination: Coordination normal.  Psychiatric:        Attention and Perception: Attention and perception normal. She does not perceive auditory or visual hallucinations.        Mood and Affect: Mood is anxious. Mood is  not depressed. Affect is not labile, angry, tearful or inappropriate.        Speech: Speech normal. Speech is not slurred.        Behavior: Behavior normal. Behavior is cooperative.        Thought Content: Thought content normal. Thought content is not paranoid or delusional. Thought content does not include homicidal or suicidal ideation. Thought content does not include suicidal plan.        Cognition and Memory: Cognition and memory normal.        Judgment: Judgment normal.     Comments: Insight fair to good.  Depression has almost resolved with the Abilify for the most part.   Anxiety remains moderate      Lab  Review:     Component Value Date/Time   NA 137 02/21/2017 1635   K 4.3 02/21/2017 1635   CL 102 02/21/2017 1635   CO2 29 02/21/2017 1635   GLUCOSE 88 02/21/2017 1635   BUN 16 02/21/2017 1635   CREATININE 0.58 02/21/2017 1635   CALCIUM 9.4 02/21/2017 1635   PROT 6.8 02/21/2017 1635   ALBUMIN 4.4 12/30/2015 1507   AST 22 02/21/2017 1635   ALT 20 02/21/2017 1635   ALKPHOS 28 (L) 12/30/2015 1507   BILITOT 0.3 02/21/2017 1635   GFRNONAA 97 02/21/2017 1635   GFRAA 112 02/21/2017 1635       Component Value Date/Time   WBC 6.6 02/21/2017 1635   RBC 4.36 02/21/2017 1635   HGB 12.9 02/21/2017 1635   HCT 38.2 02/21/2017 1635   PLT 327 02/21/2017 1635   MCV 87.6 02/21/2017 1635   MCH 29.6 02/21/2017 1635   MCHC 33.8 02/21/2017 1635   RDW 12.4 02/21/2017 1635   LYMPHSABS 1,228 02/21/2017 1635   MONOABS 402 12/30/2015 1507   EOSABS 132 02/21/2017 1635   BASOSABS 73 02/21/2017 1635    No results found for: "POCLITH", "LITHIUM"   No results found for: "PHENYTOIN", "PHENOBARB", "VALPROATE", "CBMZ"   .res Assessment: Plan:    Major depressive disorder, recurrent episode, moderate (HCC)  Generalized anxiety disorder  Social anxiety disorder  Attention deficit hyperactivity disorder (ADHD), predominantly inattentive type  Hypersomnolence disorder, persistent    Greater than 50% of 30 min face to face time with patient was spent on counseling and coordination of care. We discussed Less depressed with the Abilify clear benefit for depression and anxiety.   It's helped productivity and mood and energy and less hypersomnolence. Continue Abilify to 5 mg daily.   Disc glucose and A1c again.Marland Kitchen    Option check labs and Hgb A1C bc in past was prediabetic.  We discussed the short-term risks associated with benzodiazepines including sedation and increased fall risk among others.  Discussed long-term side effect risk including dependence, potential withdrawal symptoms, and the potential eventual dose-related risk of dementia.  But recent studies from 2020 dispute this association between benzodiazepines and dementia risk. Newer studies in 2020 do not support an association with dementia. Extensive discussion of it.   She was successful at reducing Xanax for sleep.  Discussed potential metabolic side effects associated with atypical antipsychotics, as well as potential risk for movement side effects. Advised pt to contact office if movement side effects occur.   Continue duloxetine 90.Marland Kitchen  Disc risk sweating  And heat intolerance with it.  Continue modafinil 150 mg and 50 mg Noon for hypersomnolence and ADD.  She doesn't tolerate traditional stimulants like Adderall and MPH..She has gotten benefit so far with it.  Disc pros and cons of increasing.  Protect sleep and try to get more if possible.    Modafinil and Abilify have corrected delayed sleep phase disorder.  Lithium 150 mg for neurocognitive protection.   Problem solving therapy and supportive interventions around dealing with her mother and her mother's chronic health complaints.  M is too old to learn new things at this point.   Disc concerns about getting [name]'s mental health addressed.  Extensive discussion of these 2 issues for 30 min.   No med changes. : duloxeitne 90, Abilify 5, modafinil 150-200 mg  AM, lithium 150 mg daily, NAC 1200 mg daily, Xanax 0.25 mg HS  45 min appt  FU 3 mos  Lynder Parents, MD, DFAPA  Please see After Visit Summary for patient specific instructions.  Future Appointments  Date Time Provider [place]  02/09/2022  1:30 PM Melvenia Beam, MD [facility] None  06/24/2022  1:00 PM [name], MD [facility] None    No orders of the defined types were placed in this encounter.      -------------------------------

## 2021-12-14 DIAGNOSIS — Z1231 Encounter for screening mammogram for malignant neoplasm of breast: Secondary | ICD-10-CM | POA: Diagnosis not present

## 2021-12-14 DIAGNOSIS — Z681 Body mass index (BMI) 19 or less, adult: Secondary | ICD-10-CM | POA: Diagnosis not present

## 2021-12-14 DIAGNOSIS — J301 Allergic rhinitis due to pollen: Secondary | ICD-10-CM | POA: Diagnosis not present

## 2021-12-14 DIAGNOSIS — J3089 Other allergic rhinitis: Secondary | ICD-10-CM | POA: Diagnosis not present

## 2021-12-14 DIAGNOSIS — Z01419 Encounter for gynecological examination (general) (routine) without abnormal findings: Secondary | ICD-10-CM | POA: Diagnosis not present

## 2021-12-14 DIAGNOSIS — J3081 Allergic rhinitis due to animal (cat) (dog) hair and dander: Secondary | ICD-10-CM | POA: Diagnosis not present

## 2021-12-18 ENCOUNTER — Other Ambulatory Visit: Payer: Self-pay | Admitting: Psychiatry

## 2021-12-24 ENCOUNTER — Other Ambulatory Visit: Payer: Self-pay | Admitting: Psychiatry

## 2022-01-18 ENCOUNTER — Other Ambulatory Visit: Payer: Self-pay | Admitting: Internal Medicine

## 2022-01-18 ENCOUNTER — Other Ambulatory Visit: Payer: Self-pay | Admitting: Psychiatry

## 2022-01-18 ENCOUNTER — Telehealth: Payer: Self-pay | Admitting: Neurology

## 2022-01-18 NOTE — Telephone Encounter (Signed)
Need new updated AUTHORIZATION:  Chronic Migraine CPT 64615  Botox J0585 Units:200units  G43.709 Chronic Migraine without aura, not intractable, without status migrainous   Pt states that her medicare card has not changed but her secondary [employer] was changed to a white card and the member ID# was changed to [id number] but medical coverage did not change.  Next appt 02-09-2022

## 2022-01-18 NOTE — Telephone Encounter (Signed)
Pt scheduled for botox injection for 02/09/22 and will need a new PA before appointment. Previous PA expired 01/10/22

## 2022-01-18 NOTE — Telephone Encounter (Signed)
Pt states that her medicare card has not changed but her secondary [employer] was changed to a white card and the member ID# was changed to [id number] but medical coverage did not change.

## 2022-01-18 NOTE — Telephone Encounter (Signed)
Can you call pt asap and ask her if she's had any insurance changes at all for 2024?

## 2022-01-18 NOTE — Telephone Encounter (Signed)
Ok to send

## 2022-01-20 ENCOUNTER — Other Ambulatory Visit: Payer: Self-pay | Admitting: Internal Medicine

## 2022-01-20 ENCOUNTER — Other Ambulatory Visit (HOSPITAL_COMMUNITY): Payer: Self-pay

## 2022-01-21 ENCOUNTER — Telehealth: Payer: Self-pay

## 2022-01-21 NOTE — Telephone Encounter (Signed)
Prior Authorization Modafinil '100MG'$  tablets #30 [employer]

## 2022-01-28 ENCOUNTER — Other Ambulatory Visit (HOSPITAL_COMMUNITY): Payer: Self-pay

## 2022-01-28 NOTE — Telephone Encounter (Signed)
Botox One-Benefit Verification [id number] Submitted!

## 2022-01-28 NOTE — Telephone Encounter (Signed)
Team, patient has active Medicare B and [place] plan, buy and bill likely the most affordable option for patient.  Please run new Botox One report for 2024.  Will check copay through pharmacy benefit also- PA required.

## 2022-01-28 NOTE — Telephone Encounter (Signed)
Could you check the status of this auth please? Appt is 02/09/22.

## 2022-01-29 ENCOUNTER — Other Ambulatory Visit (HOSPITAL_COMMUNITY): Payer: Self-pay

## 2022-01-29 NOTE — Telephone Encounter (Signed)
Pharmacy Patient Advocate Encounter  Prior Authorization for Mcarthur Rossetti has been approved.    PA# A Case ID #: [id number]     PT charge would be $682.55 thru pharmacy benefits-will call [employer] and check on the medical benefits.

## 2022-01-29 NOTE — Telephone Encounter (Signed)
Per telephone outreach to [employer]-  PT does not require a PA for this Jcode for Botox.  PT does have a $240 deductible  20% co insurance  No out of pocket Max.

## 2022-02-02 DIAGNOSIS — J384 Edema of larynx: Secondary | ICD-10-CM | POA: Diagnosis not present

## 2022-02-02 DIAGNOSIS — J385 Laryngeal spasm: Secondary | ICD-10-CM | POA: Diagnosis not present

## 2022-02-02 DIAGNOSIS — R0989 Other specified symptoms and signs involving the circulatory and respiratory systems: Secondary | ICD-10-CM | POA: Diagnosis not present

## 2022-02-02 DIAGNOSIS — J383 Other diseases of vocal cords: Secondary | ICD-10-CM | POA: Diagnosis not present

## 2022-02-02 DIAGNOSIS — K219 Gastro-esophageal reflux disease without esophagitis: Secondary | ICD-10-CM | POA: Diagnosis not present

## 2022-02-02 DIAGNOSIS — R498 Other voice and resonance disorders: Secondary | ICD-10-CM | POA: Diagnosis not present

## 2022-02-04 ENCOUNTER — Encounter: Payer: Self-pay | Admitting: *Deleted

## 2022-02-09 ENCOUNTER — Ambulatory Visit (INDEPENDENT_AMBULATORY_CARE_PROVIDER_SITE_OTHER): Payer: Medicare Other | Admitting: Neurology

## 2022-02-09 DIAGNOSIS — G43709 Chronic migraine without aura, not intractable, without status migrainosus: Secondary | ICD-10-CM | POA: Diagnosis not present

## 2022-02-09 MED ORDER — ONABOTULINUMTOXINA 200 UNITS IJ SOLR
155.0000 [IU] | Freq: Once | INTRAMUSCULAR | Status: AC
  Administered 2022-02-09: 155 [IU] via INTRAMUSCULAR

## 2022-02-09 NOTE — Progress Notes (Signed)
Consent Form Botulism Toxin Injection For Chronic Migraine  02/09/2022: Stable, doing well 11/11/2021 Transfer of care from Dr. Ninfa Meeker. she had 25 migraine days a month without aura(rarely will she have an aura), moderately to severe, would last 24-72 hours untreated, photophobia/phonophobia, smells would trigger, light was the worst, botox has significantly improved her life, changed her life, she has been having botox for years and continues to improve quality of life, no medication overuse, she only gets 4 migraines a month and < 10 total headache days a month now with botox.  Gave her info on [facility].   Reviewed orally with patient, additionally signature is on file:  Botulism toxin has been approved by the Federal drug administration for treatment of chronic migraine. Botulism toxin does not cure chronic migraine and it may not be effective in some patients.  The administration of botulism toxin is accomplished by injecting a small amount of toxin into the muscles of the neck and head. Dosage must be titrated for each individual. Any benefits resulting from botulism toxin tend to wear off after 3 months with a repeat injection required if benefit is to be maintained. Injections are usually done every 3-4 months with maximum effect peak achieved by about 2 or 3 weeks. Botulism toxin is expensive and you should be sure of what costs you will incur resulting from the injection.  The side effects of botulism toxin use for chronic migraine may include:   -Transient, and usually mild, facial weakness with facial injections  -Transient, and usually mild, head or neck weakness with head/neck injections  -Reduction or loss of forehead facial animation due to forehead muscle weakness  -Eyelid drooping  -Dry eye  -Pain at the site of injection or bruising at the site of injection  -Double vision  -Potential unknown long term risks  Contraindications: You should not have Botox if you are pregnant,  nursing, allergic to albumin, have an infection, skin condition, or muscle weakness at the site of the injection, or have myasthenia gravis, Lambert-Eaton syndrome, or ALS.  It is also possible that as with any injection, there may be an allergic reaction or no effect from the medication. Reduced effectiveness after repeated injections is sometimes seen and rarely infection at the injection site may occur. All care will be taken to prevent these side effects. If therapy is given over a long time, atrophy and wasting in the muscle injected may occur. Occasionally the patient's become refractory to treatment because they develop antibodies to the toxin. In this event, therapy needs to be modified.  I have read the above information and consent to the administration of botulism toxin.    BOTOX PROCEDURE NOTE FOR MIGRAINE HEADACHE    Contraindications and precautions discussed with patient(above). Aseptic procedure was observed and patient tolerated procedure. Procedure performed by Dr. Georgia Dom  The condition has existed for more than 6 months, and pt does not have a diagnosis of ALS, Myasthenia Gravis or Lambert-Eaton Syndrome.  Risks and benefits of injections discussed and pt agrees to proceed with the procedure.  Written consent obtained  These injections are medically necessary. Pt  receives good benefits from these injections. These injections do not cause sedations or hallucinations which the oral therapies may cause.  Description of procedure:  The patient was placed in a sitting position. The standard protocol was used for Botox as follows, with 5 units of Botox injected at each site:   -Procerus muscle, midline injection  -Corrugator muscle, bilateral injection  -Frontalis  muscle, bilateral injection, with 2 sites each side, medial injection was performed in the upper one third of the frontalis muscle, in the region vertical from the medial inferior edge of the superior orbital  rim. The lateral injection was again in the upper one third of the forehead vertically above the lateral limbus of the cornea, 1.5 cm lateral to the medial injection site.  -Temporalis muscle injection, 4 sites, bilaterally. The first injection was 3 cm above the tragus of the ear, second injection site was 1.5 cm to 3 cm up from the first injection site in line with the tragus of the ear. The third injection site was 1.5-3 cm forward between the first 2 injection sites. The fourth injection site was 1.5 cm posterior to the second injection site.   -Occipitalis muscle injection, 3 sites, bilaterally. The first injection was done one half way between the occipital protuberance and the tip of the mastoid process behind the ear. The second injection site was done lateral and superior to the first, 1 fingerbreadth from the first injection. The third injection site was 1 fingerbreadth superiorly and medially from the first injection site.  -Cervical paraspinal muscle injection, 2 sites, bilateral knee first injection site was 1 cm from the midline of the cervical spine, 3 cm inferior to the lower border of the occipital protuberance. The second injection site was 1.5 cm superiorly and laterally to the first injection site.  -Trapezius muscle injection was performed at 3 sites, bilaterally. The first injection site was in the upper trapezius muscle halfway between the inflection point of the neck, and the acromion. The second injection site was one half way between the acromion and the first injection site. The third injection was done between the first injection site and the inflection point of the neck.   Will return for repeat injection in 3 months.   200 units of Botox was used, 45U Botox not injected was wasted. The patient tolerated the procedure well, there were no complications of the above procedure.

## 2022-02-09 NOTE — Progress Notes (Signed)
Botox consent signed  Botox- 200 units x 1 vial Lot: [id number] Expiration: 06/2024 NDC: 4600-2984-73  Bacteriostatic 0.9% Sodium Chloride- 61m total Lot: [id number]: 11/25 NDC: 670052-591-02 Dx: GI90.228B/B

## 2022-02-10 NOTE — Telephone Encounter (Signed)
Pt called today checking on the status of her PA. Please give her a call

## 2022-02-11 ENCOUNTER — Encounter: Payer: Self-pay | Admitting: *Deleted

## 2022-02-11 ENCOUNTER — Telehealth: Payer: Self-pay | Admitting: *Deleted

## 2022-02-11 NOTE — Telephone Encounter (Signed)
Lauren Bradley patient assistance application faxed to [facility]. Received a receipt of confirmation.

## 2022-02-12 ENCOUNTER — Telehealth: Payer: Self-pay

## 2022-02-12 NOTE — Telephone Encounter (Signed)
[place] for pt's renewal of Modafinil 100 mg #60 reviewed and answered over the phone, approval received effective 01/11/2022-01/11/2023 PA# [id number] Cost should be $21.09  [facility] faxed information.

## 2022-02-12 NOTE — Telephone Encounter (Signed)
PA approved today, patient notified.

## 2022-02-15 DIAGNOSIS — R490 Dysphonia: Secondary | ICD-10-CM | POA: Diagnosis not present

## 2022-02-15 DIAGNOSIS — R1312 Dysphagia, oropharyngeal phase: Secondary | ICD-10-CM | POA: Diagnosis not present

## 2022-02-17 ENCOUNTER — Other Ambulatory Visit: Payer: Self-pay | Admitting: Psychiatry

## 2022-02-17 DIAGNOSIS — F331 Major depressive disorder, recurrent, moderate: Secondary | ICD-10-CM

## 2022-02-17 NOTE — Telephone Encounter (Signed)
Spoke with patient will mail out new  [employer] forms today . Pt thanked me for calling

## 2022-03-16 ENCOUNTER — Encounter: Payer: Self-pay | Admitting: Psychiatry

## 2022-03-16 ENCOUNTER — Ambulatory Visit (INDEPENDENT_AMBULATORY_CARE_PROVIDER_SITE_OTHER): Payer: Medicare Other | Admitting: Psychiatry

## 2022-03-16 ENCOUNTER — Other Ambulatory Visit: Payer: Self-pay | Admitting: Obstetrics and Gynecology

## 2022-03-16 DIAGNOSIS — F4010 Social phobia, unspecified: Secondary | ICD-10-CM | POA: Diagnosis not present

## 2022-03-16 DIAGNOSIS — F411 Generalized anxiety disorder: Secondary | ICD-10-CM

## 2022-03-16 DIAGNOSIS — M816 Localized osteoporosis [Lequesne]: Secondary | ICD-10-CM | POA: Diagnosis not present

## 2022-03-16 DIAGNOSIS — G4710 Hypersomnia, unspecified: Secondary | ICD-10-CM

## 2022-03-16 DIAGNOSIS — F900 Attention-deficit hyperactivity disorder, predominantly inattentive type: Secondary | ICD-10-CM | POA: Diagnosis not present

## 2022-03-16 DIAGNOSIS — R29890 Loss of height: Secondary | ICD-10-CM | POA: Diagnosis not present

## 2022-03-16 DIAGNOSIS — N63 Unspecified lump in unspecified breast: Secondary | ICD-10-CM

## 2022-03-16 DIAGNOSIS — M8580 Other specified disorders of bone density and structure, unspecified site: Secondary | ICD-10-CM | POA: Diagnosis not present

## 2022-03-16 DIAGNOSIS — F331 Major depressive disorder, recurrent, moderate: Secondary | ICD-10-CM | POA: Diagnosis not present

## 2022-03-16 DIAGNOSIS — E039 Hypothyroidism, unspecified: Secondary | ICD-10-CM | POA: Diagnosis not present

## 2022-03-16 DIAGNOSIS — N958 Other specified menopausal and perimenopausal disorders: Secondary | ICD-10-CM | POA: Diagnosis not present

## 2022-03-16 DIAGNOSIS — N6311 Unspecified lump in the right breast, upper outer quadrant: Secondary | ICD-10-CM | POA: Diagnosis not present
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
## 2022-03-19 ENCOUNTER — Other Ambulatory Visit: Payer: Self-pay | Admitting: Psychiatry

## 2022-03-24 DIAGNOSIS — M542 Cervicalgia: Secondary | ICD-10-CM | POA: Diagnosis not present

## 2022-03-25 ENCOUNTER — Other Ambulatory Visit: Payer: Self-pay | Admitting: Family

## 2022-03-25 ENCOUNTER — Telehealth: Payer: Self-pay | Admitting: Neurology

## 2022-03-25 DIAGNOSIS — N63 Unspecified lump in unspecified breast: Secondary | ICD-10-CM

## 2022-03-25 NOTE — Telephone Encounter (Signed)
Pt has asked to speak with Dr Jaynee Eagles or RN re: her believing she may be having a flare up from a neck injury.  Pt states she is having lots of migraines and visual disturbances as well.Please call pt to discuss.

## 2022-03-25 NOTE — Telephone Encounter (Signed)
I spoke with the patient.  She states that on February 4 she had a neck injury while at the salon.  She had to hold her head up with no support underneath it for 20 minutes while getting her hair washed and her head/neck was pushed down in an awkward position and she wasn't comfortable speaking up. Patient states she saw a neck specialist yesterday at the orthopedic office and thinks she has pretty significant injury.  She likely has a pinched nerve.  She states the pain is in the right side of the back of her head where her migraines come from.  Unfortunately this injury has triggered migraines and headaches with nausea and vomiting and seeing "squigglys" in her visual field.  Last weekend she was in the bed for 3 days.  She states she has taken her sumatriptan multiple times. She does not do well with NSAIDS as they cause stomach issues and other side effects.  She just started a steroid taper and is going to be sent to physical therapy and to have dry needling.  She is asking if Dr. Jaynee Eagles has any other recommendations at this time.  I did suggest to her to see if the steroid dose pack and the dry needling help as that is what we many times recommend for persistent migraines to break the cycle.  The patient is going to continue the course and if in 2 to 3 weeks if she has not improved she will call for an appointment to address the migraines.  Prior to this she was doing very well on Botox. She was appreciative.

## 2022-03-31 ENCOUNTER — Ambulatory Visit
Admission: RE | Admit: 2022-03-31 | Discharge: 2022-03-31 | Disposition: A | Discharge status: Home / Self Care | Payer: Medicare Other | Source: Ambulatory Visit | Attending: Obstetrics and Gynecology | Admitting: Obstetrics and Gynecology

## 2022-03-31 DIAGNOSIS — N63 Unspecified lump in unspecified breast: Secondary | ICD-10-CM

## 2022-03-31 DIAGNOSIS — N6011 Diffuse cystic mastopathy of right breast: Secondary | ICD-10-CM | POA: Diagnosis not present

## 2022-03-31 DIAGNOSIS — R922 Inconclusive mammogram: Secondary | ICD-10-CM | POA: Diagnosis not present

## 2022-04-01 DIAGNOSIS — M542 Cervicalgia: Secondary | ICD-10-CM | POA: Diagnosis not present

## 2022-04-07 ENCOUNTER — Telehealth: Payer: Self-pay | Admitting: Neurology

## 2022-04-07 NOTE — Telephone Encounter (Signed)
Can you check to see if we need an auth for pt's BCBS supplement for Botox? Looks like it's a new supplement for 2024. Thank you, she's scheduled for 05/04/22.

## 2022-04-12 ENCOUNTER — Other Ambulatory Visit: Payer: Self-pay | Admitting: Psychiatry

## 2022-04-12 DIAGNOSIS — G4710 Hypersomnia, unspecified: Secondary | ICD-10-CM

## 2022-04-12 DIAGNOSIS — F900 Attention-deficit hyperactivity disorder, predominantly inattentive type: Secondary | ICD-10-CM

## 2022-04-13 DIAGNOSIS — L570 Actinic keratosis: Secondary | ICD-10-CM | POA: Diagnosis not present

## 2022-04-13 DIAGNOSIS — L988 Other specified disorders of the skin and subcutaneous tissue: Secondary | ICD-10-CM | POA: Diagnosis not present

## 2022-04-13 DIAGNOSIS — L821 Other seborrheic keratosis: Secondary | ICD-10-CM | POA: Diagnosis not present

## 2022-04-16 ENCOUNTER — Other Ambulatory Visit (HOSPITAL_COMMUNITY): Payer: Self-pay

## 2022-04-16 DIAGNOSIS — M542 Cervicalgia: Secondary | ICD-10-CM | POA: Diagnosis not present

## 2022-04-23 NOTE — Telephone Encounter (Signed)
Benefit Verification [id number] Submitted!  Botox One

## 2022-04-27 NOTE — Telephone Encounter (Signed)
    NO PA NEEDED for Traditional Medicare either.  Buy and [employer]

## 2022-04-27 NOTE — Telephone Encounter (Signed)
Checking on auth, thanks.

## 2022-05-04 ENCOUNTER — Encounter: Payer: Self-pay | Admitting: Neurology

## 2022-05-04 ENCOUNTER — Ambulatory Visit (INDEPENDENT_AMBULATORY_CARE_PROVIDER_SITE_OTHER): Payer: Medicare Other | Admitting: Neurology

## 2022-05-04 DIAGNOSIS — G43709 Chronic migraine without aura, not intractable, without status migrainosus: Secondary | ICD-10-CM

## 2022-05-04 MED ORDER — ONABOTULINUMTOXINA 200 UNITS IJ SOLR
155.0000 [IU] | Freq: Once | INTRAMUSCULAR | Status: AC
  Administered 2022-05-04: 155 [IU] via INTRAMUSCULAR

## 2022-05-04 NOTE — Progress Notes (Signed)
Botox- 200 units x 1 vial Lot: [id number] Expiration: 06/2024 NDC: 4540-9811-91  Bacteriostatic 0.9% Sodium Chloride- 4 mL total Lot: [id number] Expiration: 11/2023 NDC: 21308-657-84  Dx: G43.709 B/B Witnessed by Alveria Apley. CMA

## 2022-05-04 NOTE — Progress Notes (Signed)
Consent Form Botulism Toxin Injection For Chronic Migraine  02/09/2022: Stable, doing well 11/11/2021 Transfer of care from Dr. Goforth. she had 25 migraine days a month without aura(rarely will she have an aura), moderately to severe, would last 24-72 hours untreated, photophobia/phonophobia, smells would trigger, light was the worst, botox has significantly improved her life, changed her life, she has been having botox for years and continues to improve quality of life, no medication overuse, she only gets 4 migraines a month and < 10 total headache days a month now with botox.  Gave her info on [facility].   Reviewed orally with patient, additionally signature is on file:  Botulism toxin has been approved by the Federal drug administration for treatment of chronic migraine. Botulism toxin does not cure chronic migraine and it may not be effective in some patients.  The administration of botulism toxin is accomplished by injecting a small amount of toxin into the muscles of the neck and head. Dosage must be titrated for each individual. Any benefits resulting from botulism toxin tend to wear off after 3 months with a repeat injection required if benefit is to be maintained. Injections are usually done every 3-4 months with maximum effect peak achieved by about 2 or 3 weeks. Botulism toxin is expensive and you should be sure of what costs you will incur resulting from the injection.  The side effects of botulism toxin use for chronic migraine may include:   -Transient, and usually mild, facial weakness with facial injections  -Transient, and usually mild, head or neck weakness with head/neck injections  -Reduction or loss of forehead facial animation due to forehead muscle weakness  -Eyelid drooping  -Dry eye  -Pain at the site of injection or bruising at the site of injection  -Double vision  -Potential unknown long term risks  Contraindications: You should not have Botox if you are pregnant,  nursing, allergic to albumin, have an infection, skin condition, or muscle weakness at the site of the injection, or have myasthenia gravis, Lambert-Eaton syndrome, or ALS.  It is also possible that as with any injection, there may be an allergic reaction or no effect from the medication. Reduced effectiveness after repeated injections is sometimes seen and rarely infection at the injection site may occur. All care will be taken to prevent these side effects. If therapy is given over a long time, atrophy and wasting in the muscle injected may occur. Occasionally the patient's become refractory to treatment because they develop antibodies to the toxin. In this event, therapy needs to be modified.  I have read the above information and consent to the administration of botulism toxin.    BOTOX PROCEDURE NOTE FOR MIGRAINE HEADACHE    Contraindications and precautions discussed with patient(above). Aseptic procedure was observed and patient tolerated procedure. Procedure performed by Dr. Toni Sharmane Dame  The condition has existed for more than 6 months, and pt does not have a diagnosis of ALS, Myasthenia Gravis or Lambert-Eaton Syndrome.  Risks and benefits of injections discussed and pt agrees to proceed with the procedure.  Written consent obtained  These injections are medically necessary. Pt  receives good benefits from these injections. These injections do not cause sedations or hallucinations which the oral therapies may cause.  Description of procedure:  The patient was placed in a sitting position. The standard protocol was used for Botox as follows, with 5 units of Botox injected at each site:   -Procerus muscle, midline injection  -Corrugator muscle, bilateral injection  -Frontalis   muscle, bilateral injection, with 2 sites each side, medial injection was performed in the upper one third of the frontalis muscle, in the region vertical from the medial inferior edge of the superior orbital  rim. The lateral injection was again in the upper one third of the forehead vertically above the lateral limbus of the cornea, 1.5 cm lateral to the medial injection site.  -Temporalis muscle injection, 4 sites, bilaterally. The first injection was 3 cm above the tragus of the ear, second injection site was 1.5 cm to 3 cm up from the first injection site in line with the tragus of the ear. The third injection site was 1.5-3 cm forward between the first 2 injection sites. The fourth injection site was 1.5 cm posterior to the second injection site.   -Occipitalis muscle injection, 3 sites, bilaterally. The first injection was done one half way between the occipital protuberance and the tip of the mastoid process behind the ear. The second injection site was done lateral and superior to the first, 1 fingerbreadth from the first injection. The third injection site was 1 fingerbreadth superiorly and medially from the first injection site.  -Cervical paraspinal muscle injection, 2 sites, bilateral knee first injection site was 1 cm from the midline of the cervical spine, 3 cm inferior to the lower border of the occipital protuberance. The second injection site was 1.5 cm superiorly and laterally to the first injection site.  -Trapezius muscle injection was performed at 3 sites, bilaterally. The first injection site was in the upper trapezius muscle halfway between the inflection point of the neck, and the acromion. The second injection site was one half way between the acromion and the first injection site. The third injection was done between the first injection site and the inflection point of the neck.   Will return for repeat injection in 3 months.   200 units of Botox was used, 45U Botox not injected was wasted. The patient tolerated the procedure well, there were no complications of the above procedure.   

## 2022-05-12 DIAGNOSIS — Z79899 Other long term (current) drug therapy: Secondary | ICD-10-CM | POA: Diagnosis not present

## 2022-05-12 DIAGNOSIS — J45909 Unspecified asthma, uncomplicated: Secondary | ICD-10-CM | POA: Diagnosis not present

## 2022-05-12 DIAGNOSIS — Z836 Family history of other diseases of the respiratory system: Secondary | ICD-10-CM | POA: Diagnosis not present

## 2022-05-12 DIAGNOSIS — R059 Cough, unspecified: Secondary | ICD-10-CM | POA: Diagnosis not present

## 2022-05-17 DIAGNOSIS — M7741 Metatarsalgia, right foot: Secondary | ICD-10-CM | POA: Diagnosis not present

## 2022-05-17 DIAGNOSIS — M205X2 Other deformities of toe(s) (acquired), left foot: Secondary | ICD-10-CM | POA: Diagnosis not present

## 2022-05-17 DIAGNOSIS — M7742 Metatarsalgia, left foot: Secondary | ICD-10-CM | POA: Diagnosis not present

## 2022-05-17 DIAGNOSIS — M24575 Contracture, left foot: Secondary | ICD-10-CM | POA: Diagnosis not present

## 2022-05-17 DIAGNOSIS — M25372 Other instability, left ankle: Secondary | ICD-10-CM | POA: Diagnosis not present

## 2022-05-18 ENCOUNTER — Other Ambulatory Visit: Payer: Self-pay | Admitting: Psychiatry

## 2022-05-18 DIAGNOSIS — F331 Major depressive disorder, recurrent, moderate: Secondary | ICD-10-CM

## 2022-05-27 DIAGNOSIS — H019 Unspecified inflammation of eyelid: Secondary | ICD-10-CM | POA: Diagnosis not present

## 2022-05-27 DIAGNOSIS — M542 Cervicalgia: Secondary | ICD-10-CM | POA: Diagnosis not present

## 2022-05-27 DIAGNOSIS — L821 Other seborrheic keratosis: Secondary | ICD-10-CM | POA: Diagnosis not present

## 2022-05-27 DIAGNOSIS — L570 Actinic keratosis: Secondary | ICD-10-CM | POA: Diagnosis not present

## 2022-05-31 DIAGNOSIS — M542 Cervicalgia: Secondary | ICD-10-CM | POA: Diagnosis not present

## 2022-06-02 DIAGNOSIS — M542 Cervicalgia: Secondary | ICD-10-CM | POA: Diagnosis not present

## 2022-06-05 ENCOUNTER — Other Ambulatory Visit: Payer: Self-pay | Admitting: Psychiatry

## 2022-06-05 DIAGNOSIS — F411 Generalized anxiety disorder: Secondary | ICD-10-CM

## 2022-06-08 DIAGNOSIS — H35372 Puckering of macula, left eye: Secondary | ICD-10-CM | POA: Diagnosis not present

## 2022-06-10 NOTE — Progress Notes (Deleted)
Office Visit Note  Patient: Lauren Bradley             Date of Birth: 03-Apr-1950           MRN: [id number]             PCP: Oneita Hurt, No Referring: Kirby Funk, MD Visit Date: 06/24/2022 Occupation: @GUAROCC @  Subjective:  No chief complaint on file.   History of Present Illness: Lauren Bradley is a 72 y.o. female ***     Activities of Daily Living:  Patient reports morning stiffness for *** {minute/hour:19697}.   Patient {ACTIONS;DENIES/REPORTS:21021675::"Denies"} nocturnal pain.  Difficulty dressing/grooming: {ACTIONS;DENIES/REPORTS:21021675::"Denies"} Difficulty climbing stairs: {ACTIONS;DENIES/REPORTS:21021675::"Denies"} Difficulty getting out of chair: {ACTIONS;DENIES/REPORTS:21021675::"Denies"} Difficulty using hands for taps, buttons, cutlery, and/or writing: {ACTIONS;DENIES/REPORTS:21021675::"Denies"}  No Rheumatology ROS completed.   PMFS History:  Patient Active Problem List   Diagnosis Date Noted   Upper airway cough syndrome 01/15/2021   Major depressive disorder, single episode, severe (HCC) 10/06/2017   History of vitamin D deficiency 10/12/2016   History of seasonal allergies 12/29/2015   Fibromyalgia 12/29/2015   Other fatigue 12/29/2015   Primary osteoarthritis of both hands 12/29/2015   Primary osteoarthritis of both feet 12/29/2015   DJD (degenerative joint disease), cervical 12/29/2015   DDD (degenerative disc disease), lumbar 12/29/2015   Osteopenia of multiple sites 12/29/2015   History of migraine 12/29/2015    Past Medical History:  Diagnosis Date   Asthma    DDD (degenerative disc disease), cervical    DDD (degenerative disc disease), lumbar    Fatigue    Fibromyalgia    Infertility, female    Osteoarthritis     Family History  Problem Relation Age of Onset   Fibromyalgia Mother    Kidney failure Mother    Mental illness Mother    Migraines Mother    Heart disease Father    COPD Father    Cancer Father        colon, liver     Migraines Paternal Aunt    Hypothyroidism Son    Past Surgical History:  Procedure Laterality Date   CESAREAN SECTION     FOOT SURGERY     GALLBLADDER SURGERY     KNEE SURGERY     Social History   Social History Narrative   Not on file   Immunization History  Administered Date(s) Administered   Influenza, High Dose Seasonal PF 03/22/2019, 10/25/2019, 11/11/2020   Influenza-Unspecified 12/10/2016   Moderna Sars-Covid-2 Vaccination 03/09/2019, 03/22/2019, 04/06/2019   Pneumococcal Polysaccharide-23 10/25/2019, 11/05/2020   Zoster Recombinat (Shingrix) 08/31/2017, 11/29/2017     Objective: Vital Signs: There were no vitals taken for this visit.   Physical Exam   Musculoskeletal Exam: ***  CDAI Exam: CDAI Score: -- Patient Global: --; Provider Global: -- Swollen: --; Tender: -- Joint Exam 06/24/2022   No joint exam has been documented for this visit   There is currently no information documented on the homunculus. Go to the Rheumatology activity and complete the homunculus joint exam.  Investigation: No additional findings.  Imaging: No results found.  Recent Labs: Lab Results  Component Value Date   WBC 6.6 02/21/2017   HGB 12.9 02/21/2017   PLT 327 02/21/2017   NA 137 02/21/2017   K 4.3 02/21/2017   CL 102 02/21/2017   CO2 29 02/21/2017   GLUCOSE 88 02/21/2017   BUN 16 02/21/2017   CREATININE 0.58 02/21/2017   BILITOT 0.3 02/21/2017   ALKPHOS 28 (L) 12/30/2015   AST  22 02/21/2017   ALT 20 02/21/2017   PROT 6.8 02/21/2017   ALBUMIN 4.4 12/30/2015   CALCIUM 9.4 02/21/2017   GFRAA 112 02/21/2017    Speciality Comments: No specialty comments available.  Procedures:  No procedures performed Allergies: Hydrocodone, Codeine, Other, Sulfa antibiotics, Cephalosporins, and Sulfamethoxazole   Assessment / Plan:     Visit Diagnoses: No diagnosis found.  Orders: No orders of the defined types were placed in this encounter.  No orders of the defined  types were placed in this encounter.   Face-to-face time spent with patient was *** minutes. Greater than 50% of time was spent in counseling and coordination of care.  Follow-Up Instructions: No follow-ups on file.   Ellen Henri, CMA  Note - This record has been created using Animal nutritionist.  Chart creation errors have been sought, but may not always  have been located. Such creation errors do not reflect on  the standard of medical care.

## 2022-06-11 DIAGNOSIS — M542 Cervicalgia: Secondary | ICD-10-CM | POA: Diagnosis not present

## 2022-06-14 DIAGNOSIS — M542 Cervicalgia: Secondary | ICD-10-CM | POA: Diagnosis not present

## 2022-06-16 ENCOUNTER — Other Ambulatory Visit: Payer: Self-pay | Admitting: Psychiatry

## 2022-06-16 DIAGNOSIS — M542 Cervicalgia: Secondary | ICD-10-CM | POA: Diagnosis not present

## 2022-06-21 ENCOUNTER — Ambulatory Visit: Payer: Medicare Other | Admitting: Psychiatry

## 2022-06-21 DIAGNOSIS — M542 Cervicalgia: Secondary | ICD-10-CM | POA: Diagnosis not present

## 2022-06-23 DIAGNOSIS — M542 Cervicalgia: Secondary | ICD-10-CM | POA: Diagnosis not present

## 2022-06-24 ENCOUNTER — Ambulatory Visit: Payer: Medicare Other | Admitting: Rheumatology

## 2022-06-24 DIAGNOSIS — M5136 Other intervertebral disc degeneration, lumbar region: Secondary | ICD-10-CM

## 2022-06-24 DIAGNOSIS — M19041 Primary osteoarthritis, right hand: Secondary | ICD-10-CM

## 2022-06-24 DIAGNOSIS — R5383 Other fatigue: Secondary | ICD-10-CM

## 2022-06-24 DIAGNOSIS — Z8639 Personal history of other endocrine, nutritional and metabolic disease: Secondary | ICD-10-CM

## 2022-06-24 DIAGNOSIS — M797 Fibromyalgia: Secondary | ICD-10-CM

## 2022-06-24 DIAGNOSIS — M5030 Other cervical disc degeneration, unspecified cervical region: Secondary | ICD-10-CM

## 2022-06-24 DIAGNOSIS — M19072 Primary osteoarthritis, left ankle and foot: Secondary | ICD-10-CM

## 2022-06-24 DIAGNOSIS — M8589 Other specified disorders of bone density and structure, multiple sites: Secondary | ICD-10-CM

## 2022-06-24 DIAGNOSIS — S322XXS Fracture of coccyx, sequela: Secondary | ICD-10-CM

## 2022-07-05 DIAGNOSIS — M542 Cervicalgia: Secondary | ICD-10-CM | POA: Diagnosis not present

## 2022-07-08 DIAGNOSIS — M542 Cervicalgia: Secondary | ICD-10-CM | POA: Diagnosis not present

## 2022-07-12 ENCOUNTER — Other Ambulatory Visit: Payer: Self-pay | Admitting: Psychiatry

## 2022-07-12 DIAGNOSIS — F411 Generalized anxiety disorder: Secondary | ICD-10-CM

## 2022-07-20 DIAGNOSIS — M542 Cervicalgia: Secondary | ICD-10-CM | POA: Diagnosis not present

## 2022-07-23 DIAGNOSIS — M542 Cervicalgia: Secondary | ICD-10-CM | POA: Diagnosis not present

## 2022-07-26 DIAGNOSIS — M542 Cervicalgia: Secondary | ICD-10-CM | POA: Diagnosis not present

## 2022-07-27 ENCOUNTER — Ambulatory Visit (INDEPENDENT_AMBULATORY_CARE_PROVIDER_SITE_OTHER): Payer: Medicare Other | Admitting: Neurology

## 2022-07-27 DIAGNOSIS — G43709 Chronic migraine without aura, not intractable, without status migrainosus: Secondary | ICD-10-CM

## 2022-07-27 MED ORDER — ONABOTULINUMTOXINA 200 UNITS IJ SOLR
155.0000 [IU] | Freq: Once | INTRAMUSCULAR | Status: AC
Start: 2022-07-27 — End: 2022-07-27
  Administered 2022-07-27: 155 [IU] via INTRAMUSCULAR

## 2022-07-27 NOTE — Progress Notes (Unsigned)
Consent Form Botulism Toxin Injection For Chronic Migraine  07/27/22: she has cts and thumb bone on bone sees gramig. Getting gaston treatment for her right muscle tightness, sam(antha) as [facility], stable still doing great on botoc 05/04/2022: stable  04/10/2022: Stable, doing well 11/11/2021 Transfer of care from Dr. Antonietta Jewel. she had 25 migraine days a month without aura(rarely will she have an aura), moderately to severe, would last 24-72 hours untreated, photophobia/phonophobia, smells would trigger, light was the worst, botox has significantly improved her life, changed her life, she has been having botox for years and continues to improve quality of life, no medication overuse, she only gets 4 migraines a month and < 10 total headache days a month now with botox.  Gave her info on [facility] [place].   Reviewed orally with patient, additionally signature is on file:  Botulism toxin has been approved by the Federal drug administration for treatment of chronic migraine. Botulism toxin does not cure chronic migraine and it may not be effective in some patients.  The administration of botulism toxin is accomplished by injecting a small amount of toxin into the muscles of the neck and head. Dosage must be titrated for each individual. Any benefits resulting from botulism toxin tend to wear off after 3 months with a repeat injection required if benefit is to be maintained. Injections are usually done every 3-4 months with maximum effect peak achieved by about 2 or 3 weeks. Botulism toxin is expensive and you should be sure of what costs you will incur resulting from the injection.  The side effects of botulism toxin use for chronic migraine may include:   -Transient, and usually mild, facial weakness with facial injections  -Transient, and usually mild, head or neck weakness with head/neck injections  -Reduction or loss of forehead facial animation due to forehead muscle weakness  -Eyelid drooping  -Dry  eye  -Pain at the site of injection or bruising at the site of injection  -Double vision  -Potential unknown long term risks  Contraindications: You should not have Botox if you are pregnant, nursing, allergic to albumin, have an infection, skin condition, or muscle weakness at the site of the injection, or have myasthenia gravis, Lambert-Eaton syndrome, or ALS.  It is also possible that as with any injection, there may be an allergic reaction or no effect from the medication. Reduced effectiveness after repeated injections is sometimes seen and rarely infection at the injection site may occur. All care will be taken to prevent these side effects. If therapy is given over a long time, atrophy and wasting in the muscle injected may occur. Occasionally the patient's become refractory to treatment because they develop antibodies to the toxin. In this event, therapy needs to be modified.  I have read the above information and consent to the administration of botulism toxin.    BOTOX PROCEDURE NOTE FOR MIGRAINE HEADACHE    Contraindications and precautions discussed with patient(above). Aseptic procedure was observed and patient tolerated procedure. Procedure performed by Dr. Artemio Aly  The condition has existed for more than 6 months, and pt does not have a diagnosis of ALS, Myasthenia Gravis or Lambert-Eaton Syndrome.  Risks and benefits of injections discussed and pt agrees to proceed with the procedure.  Written consent obtained  These injections are medically necessary. Pt  receives good benefits from these injections. These injections do not cause sedations or hallucinations which the oral therapies may cause.  Description of procedure:  The patient was placed in a sitting  position. The standard protocol was used for Botox as follows, with 5 units of Botox injected at each site:   -Procerus muscle, midline injection  -Corrugator muscle, bilateral injection  -Frontalis muscle,  bilateral injection, with 2 sites each side, medial injection was performed in the upper one third of the frontalis muscle, in the region vertical from the medial inferior edge of the superior orbital rim. The lateral injection was again in the upper one third of the forehead vertically above the lateral limbus of the cornea, 1.5 cm lateral to the medial injection site.  -Temporalis muscle injection, 4 sites, bilaterally. The first injection was 3 cm above the tragus of the ear, second injection site was 1.5 cm to 3 cm up from the first injection site in line with the tragus of the ear. The third injection site was 1.5-3 cm forward between the first 2 injection sites. The fourth injection site was 1.5 cm posterior to the second injection site.   -Occipitalis muscle injection, 3 sites, bilaterally. The first injection was done one half way between the occipital protuberance and the tip of the mastoid process behind the ear. The second injection site was done lateral and superior to the first, 1 fingerbreadth from the first injection. The third injection site was 1 fingerbreadth superiorly and medially from the first injection site.  -Cervical paraspinal muscle injection, 2 sites, bilateral knee first injection site was 1 cm from the midline of the cervical spine, 3 cm inferior to the lower border of the occipital protuberance. The second injection site was 1.5 cm superiorly and laterally to the first injection site.  -Trapezius muscle injection was performed at 3 sites, bilaterally. The first injection site was in the upper trapezius muscle halfway between the inflection point of the neck, and the acromion. The second injection site was one half way between the acromion and the first injection site. The third injection was done between the first injection site and the inflection point of the neck.   Will return for repeat injection in 3 months.   200 units of Botox was used, 45U Botox not injected was  wasted. The patient tolerated the procedure well, there were no complications of the above procedure.

## 2022-07-27 NOTE — Progress Notes (Unsigned)
Botox- 200 units x 1 vial Lot: [id number] Expiration: 06/2024 NDC: 4259-5638-75  Bacteriostatic 0.9% Sodium Chloride- 4mL total IEP:[id number] Expiration: 11/12/23 NDC: 8416-6063-01  Dx: G43.709 BB  Witnessed by: Leeann Must

## 2022-07-30 DIAGNOSIS — M542 Cervicalgia: Secondary | ICD-10-CM | POA: Diagnosis not present

## 2022-08-05 ENCOUNTER — Ambulatory Visit: Payer: Medicare Other | Admitting: Psychiatry

## 2022-08-06 NOTE — Progress Notes (Deleted)
Office Visit Note  Patient: Lauren Bradley             Date of Birth: 03-Aug-1950           MRN: [id number]             PCP: Oneita Hurt, No Referring: Kirby Funk, MD Visit Date: 08/19/2022 Occupation: @GUAROCC @  Subjective:  No chief complaint on file.   History of Present Illness: Lauren Bradley is a 72 y.o. female ***     Activities of Daily Living:  Patient reports morning stiffness for *** {minute/hour:19697}.   Patient {ACTIONS;DENIES/REPORTS:21021675::"Denies"} nocturnal pain.  Difficulty dressing/grooming: {ACTIONS;DENIES/REPORTS:21021675::"Denies"} Difficulty climbing stairs: {ACTIONS;DENIES/REPORTS:21021675::"Denies"} Difficulty getting out of chair: {ACTIONS;DENIES/REPORTS:21021675::"Denies"} Difficulty using hands for taps, buttons, cutlery, and/or writing: {ACTIONS;DENIES/REPORTS:21021675::"Denies"}  No Rheumatology ROS completed.   PMFS History:  Patient Active Problem List   Diagnosis Date Noted   Upper airway cough syndrome 01/15/2021   Major depressive disorder, single episode, severe (HCC) 10/06/2017   History of vitamin D deficiency 10/12/2016   History of seasonal allergies 12/29/2015   Fibromyalgia 12/29/2015   Other fatigue 12/29/2015   Primary osteoarthritis of both hands 12/29/2015   Primary osteoarthritis of both feet 12/29/2015   DJD (degenerative joint disease), cervical 12/29/2015   DDD (degenerative disc disease), lumbar 12/29/2015   Osteopenia of multiple sites 12/29/2015   History of migraine 12/29/2015    Past Medical History:  Diagnosis Date   Asthma    DDD (degenerative disc disease), cervical    DDD (degenerative disc disease), lumbar    Fatigue    Fibromyalgia    Infertility, female    Osteoarthritis     Family History  Problem Relation Age of Onset   Fibromyalgia Mother    Kidney failure Mother    Mental illness Mother    Migraines Mother    Heart disease Father    COPD Father    Cancer Father        colon, liver     Migraines Paternal Aunt    Hypothyroidism Son    Past Surgical History:  Procedure Laterality Date   CESAREAN SECTION     FOOT SURGERY     GALLBLADDER SURGERY     KNEE SURGERY     Social History   Social History Narrative   Not on file   Immunization History  Administered Date(s) Administered   Influenza, High Dose Seasonal PF 03/22/2019, 10/25/2019, 11/11/2020   Influenza-Unspecified 12/10/2016   Moderna Sars-Covid-2 Vaccination 03/09/2019, 03/22/2019, 04/06/2019   Pneumococcal Polysaccharide-23 10/25/2019, 11/05/2020   Zoster Recombinant(Shingrix) 08/31/2017, 11/29/2017     Objective: Vital Signs: There were no vitals taken for this visit.   Physical Exam   Musculoskeletal Exam: ***  CDAI Exam: CDAI Score: -- Patient Global: --; Provider Global: -- Swollen: --; Tender: -- Joint Exam 08/19/2022   No joint exam has been documented for this visit   There is currently no information documented on the homunculus. Go to the Rheumatology activity and complete the homunculus joint exam.  Investigation: No additional findings.  Imaging: No results found.  Recent Labs: Lab Results  Component Value Date   WBC 6.6 02/21/2017   HGB 12.9 02/21/2017   PLT 327 02/21/2017   NA 137 02/21/2017   K 4.3 02/21/2017   CL 102 02/21/2017   CO2 29 02/21/2017   GLUCOSE 88 02/21/2017   BUN 16 02/21/2017   CREATININE 0.58 02/21/2017   BILITOT 0.3 02/21/2017   ALKPHOS 28 (L) 12/30/2015   AST 22  02/21/2017   ALT 20 02/21/2017   PROT 6.8 02/21/2017   ALBUMIN 4.4 12/30/2015   CALCIUM 9.4 02/21/2017   GFRAA 112 02/21/2017    Speciality Comments: No specialty comments available.  Procedures:  No procedures performed Allergies: Hydrocodone, Codeine, Other, Sulfa antibiotics, Cephalosporins, and Sulfamethoxazole   Assessment / Plan:     Visit Diagnoses: No diagnosis found.  Orders: No orders of the defined types were placed in this encounter.  No orders of the defined  types were placed in this encounter.   Face-to-face time spent with patient was *** minutes. Greater than 50% of time was spent in counseling and coordination of care.  Follow-Up Instructions: No follow-ups on file.   Ellen Henri, CMA  Note - This record has been created using Animal nutritionist.  Chart creation errors have been sought, but may not always  have been located. Such creation errors do not reflect on  the standard of medical care.

## 2022-08-10 ENCOUNTER — Other Ambulatory Visit: Payer: Self-pay | Admitting: Psychiatry

## 2022-08-10 DIAGNOSIS — F900 Attention-deficit hyperactivity disorder, predominantly inattentive type: Secondary | ICD-10-CM

## 2022-08-10 DIAGNOSIS — G4710 Hypersomnia, unspecified: Secondary | ICD-10-CM

## 2022-08-18 ENCOUNTER — Other Ambulatory Visit: Payer: Self-pay | Admitting: Psychiatry

## 2022-08-18 DIAGNOSIS — F331 Major depressive disorder, recurrent, moderate: Secondary | ICD-10-CM

## 2022-08-19 ENCOUNTER — Ambulatory Visit: Payer: Medicare Other | Admitting: Rheumatology

## 2022-08-19 DIAGNOSIS — M5030 Other cervical disc degeneration, unspecified cervical region: Secondary | ICD-10-CM

## 2022-08-19 DIAGNOSIS — R5383 Other fatigue: Secondary | ICD-10-CM

## 2022-08-19 DIAGNOSIS — M19071 Primary osteoarthritis, right ankle and foot: Secondary | ICD-10-CM

## 2022-08-19 DIAGNOSIS — Z8639 Personal history of other endocrine, nutritional and metabolic disease: Secondary | ICD-10-CM

## 2022-08-19 DIAGNOSIS — M19041 Primary osteoarthritis, right hand: Secondary | ICD-10-CM

## 2022-08-19 DIAGNOSIS — M797 Fibromyalgia: Secondary | ICD-10-CM

## 2022-08-19 DIAGNOSIS — M5136 Other intervertebral disc degeneration, lumbar region: Secondary | ICD-10-CM

## 2022-08-19 DIAGNOSIS — M8589 Other specified disorders of bone density and structure, multiple sites: Secondary | ICD-10-CM

## 2022-08-19 DIAGNOSIS — S322XXS Fracture of coccyx, sequela: Secondary | ICD-10-CM

## 2022-08-26 NOTE — Progress Notes (Deleted)
Office Visit Note  Patient: Lauren Bradley             Date of Birth: 05-Jun-1950           MRN: [id number]             PCP: Oneita Hurt, No Referring: Kirby Funk, MD Visit Date: 09/08/2022 Occupation: @GUAROCC @  Subjective:  No chief complaint on file.   History of Present Illness: Lauren Bradley is a 72 y.o. female ***     Activities of Daily Living:  Patient reports morning stiffness for *** {minute/hour:19697}.   Patient {ACTIONS;DENIES/REPORTS:21021675::"Denies"} nocturnal pain.  Difficulty dressing/grooming: {ACTIONS;DENIES/REPORTS:21021675::"Denies"} Difficulty climbing stairs: {ACTIONS;DENIES/REPORTS:21021675::"Denies"} Difficulty getting out of chair: {ACTIONS;DENIES/REPORTS:21021675::"Denies"} Difficulty using hands for taps, buttons, cutlery, and/or writing: {ACTIONS;DENIES/REPORTS:21021675::"Denies"}  No Rheumatology ROS completed.   PMFS History:  Patient Active Problem List   Diagnosis Date Noted   Upper airway cough syndrome 01/15/2021   Major depressive disorder, single episode, severe (HCC) 10/06/2017   History of vitamin D deficiency 10/12/2016   History of seasonal allergies 12/29/2015   Fibromyalgia 12/29/2015   Other fatigue 12/29/2015   Primary osteoarthritis of both hands 12/29/2015   Primary osteoarthritis of both feet 12/29/2015   DJD (degenerative joint disease), cervical 12/29/2015   DDD (degenerative disc disease), lumbar 12/29/2015   Osteopenia of multiple sites 12/29/2015   History of migraine 12/29/2015    Past Medical History:  Diagnosis Date   Asthma    DDD (degenerative disc disease), cervical    DDD (degenerative disc disease), lumbar    Fatigue    Fibromyalgia    Infertility, female    Osteoarthritis     Family History  Problem Relation Age of Onset   Fibromyalgia Mother    Kidney failure Mother    Mental illness Mother    Migraines Mother    Heart disease Father    COPD Father    Cancer Father        colon, liver     Migraines Paternal Aunt    Hypothyroidism Son    Past Surgical History:  Procedure Laterality Date   CESAREAN SECTION     FOOT SURGERY     GALLBLADDER SURGERY     KNEE SURGERY     Social History   Social History Narrative   Not on file   Immunization History  Administered Date(s) Administered   Influenza, High Dose Seasonal PF 03/22/2019, 10/25/2019, 11/11/2020   Influenza-Unspecified 12/10/2016   Moderna Sars-Covid-2 Vaccination 03/09/2019, 03/22/2019, 04/06/2019   Pneumococcal Polysaccharide-23 10/25/2019, 11/05/2020   Zoster Recombinant(Shingrix) 08/31/2017, 11/29/2017     Objective: Vital Signs: There were no vitals taken for this visit.   Physical Exam   Musculoskeletal Exam: ***  CDAI Exam: CDAI Score: -- Patient Global: --; Provider Global: -- Swollen: --; Tender: -- Joint Exam 09/08/2022   No joint exam has been documented for this visit   There is currently no information documented on the homunculus. Go to the Rheumatology activity and complete the homunculus joint exam.  Investigation: No additional findings.  Imaging: No results found.  Recent Labs: Lab Results  Component Value Date   WBC 6.6 02/21/2017   HGB 12.9 02/21/2017   PLT 327 02/21/2017   NA 137 02/21/2017   K 4.3 02/21/2017   CL 102 02/21/2017   CO2 29 02/21/2017   GLUCOSE 88 02/21/2017   BUN 16 02/21/2017   CREATININE 0.58 02/21/2017   BILITOT 0.3 02/21/2017   ALKPHOS 28 (L) 12/30/2015   AST 22  02/21/2017   ALT 20 02/21/2017   PROT 6.8 02/21/2017   ALBUMIN 4.4 12/30/2015   CALCIUM 9.4 02/21/2017   GFRAA 112 02/21/2017    Speciality Comments: No specialty comments available.  Procedures:  No procedures performed Allergies: Hydrocodone, Codeine, Other, Sulfa antibiotics, Cephalosporins, and Sulfamethoxazole   Assessment / Plan:     Visit Diagnoses: No diagnosis found.  Orders: No orders of the defined types were placed in this encounter.  No orders of the defined  types were placed in this encounter.   Face-to-face time spent with patient was *** minutes. Greater than 50% of time was spent in counseling and coordination of care.  Follow-Up Instructions: No follow-ups on file.   Ellen Henri, CMA  Note - This record has been created using Animal nutrition[employer].  Chart creation errors have been sought, but may not always  have been located. Such creation errors do not reflect on  the standard of medical care.

## 2022-09-04 ENCOUNTER — Other Ambulatory Visit: Payer: Self-pay | Admitting: Psychiatry

## 2022-09-04 DIAGNOSIS — F411 Generalized anxiety disorder: Secondary | ICD-10-CM

## 2022-09-06 DIAGNOSIS — J45909 Unspecified asthma, uncomplicated: Secondary | ICD-10-CM | POA: Diagnosis not present

## 2022-09-08 ENCOUNTER — Ambulatory Visit: Payer: Medicare Other | Admitting: Rheumatology

## 2022-09-08 ENCOUNTER — Other Ambulatory Visit (HOSPITAL_COMMUNITY): Payer: Self-pay

## 2022-09-08 DIAGNOSIS — S322XXS Fracture of coccyx, sequela: Secondary | ICD-10-CM

## 2022-09-08 DIAGNOSIS — M797 Fibromyalgia: Secondary | ICD-10-CM

## 2022-09-08 DIAGNOSIS — M5136 Other intervertebral disc degeneration, lumbar region: Secondary | ICD-10-CM

## 2022-09-08 DIAGNOSIS — R5383 Other fatigue: Secondary | ICD-10-CM

## 2022-09-08 DIAGNOSIS — M19041 Primary osteoarthritis, right hand: Secondary | ICD-10-CM

## 2022-09-08 DIAGNOSIS — M19071 Primary osteoarthritis, right ankle and foot: Secondary | ICD-10-CM

## 2022-09-08 DIAGNOSIS — Z8639 Personal history of other endocrine, nutritional and metabolic disease: Secondary | ICD-10-CM

## 2022-09-08 DIAGNOSIS — M8589 Other specified disorders of bone density and structure, multiple sites: Secondary | ICD-10-CM

## 2022-09-08 DIAGNOSIS — M5030 Other cervical disc degeneration, unspecified cervical region: Secondary | ICD-10-CM

## 2022-09-09 ENCOUNTER — Telehealth: Payer: Self-pay | Admitting: Psychiatry

## 2022-09-09 ENCOUNTER — Other Ambulatory Visit: Payer: Self-pay

## 2022-09-09 ENCOUNTER — Other Ambulatory Visit (HOSPITAL_COMMUNITY): Payer: Self-pay | Admitting: Internal Medicine

## 2022-09-09 ENCOUNTER — Other Ambulatory Visit (HOSPITAL_COMMUNITY): Payer: Self-pay

## 2022-09-09 DIAGNOSIS — E039 Hypothyroidism, unspecified: Secondary | ICD-10-CM | POA: Diagnosis not present

## 2022-09-09 DIAGNOSIS — F331 Major depressive disorder, recurrent, moderate: Secondary | ICD-10-CM

## 2022-09-09 DIAGNOSIS — M797 Fibromyalgia: Secondary | ICD-10-CM | POA: Diagnosis not present

## 2022-09-09 DIAGNOSIS — R7303 Prediabetes: Secondary | ICD-10-CM | POA: Diagnosis not present

## 2022-09-09 DIAGNOSIS — K219 Gastro-esophageal reflux disease without esophagitis: Secondary | ICD-10-CM | POA: Diagnosis not present

## 2022-09-09 DIAGNOSIS — G43709 Chronic migraine without aura, not intractable, without status migrainosus: Secondary | ICD-10-CM | POA: Diagnosis not present

## 2022-09-09 DIAGNOSIS — I10 Essential (primary) hypertension: Secondary | ICD-10-CM | POA: Diagnosis not present

## 2022-09-09 DIAGNOSIS — J4520 Mild intermittent asthma, uncomplicated: Secondary | ICD-10-CM | POA: Diagnosis not present

## 2022-09-09 DIAGNOSIS — D509 Iron deficiency anemia, unspecified: Secondary | ICD-10-CM | POA: Diagnosis not present

## 2022-09-09 DIAGNOSIS — Z79899 Other long term (current) drug therapy: Secondary | ICD-10-CM | POA: Diagnosis not present

## 2022-09-09 DIAGNOSIS — Z1331 Encounter for screening for depression: Secondary | ICD-10-CM | POA: Diagnosis not present

## 2022-09-09 DIAGNOSIS — Z0000 Encounter for general adult medical examination without abnormal findings: Secondary | ICD-10-CM | POA: Diagnosis not present

## 2022-09-09 DIAGNOSIS — Z8249 Family history of ischemic heart disease and other diseases of the circulatory system: Secondary | ICD-10-CM | POA: Diagnosis not present

## 2022-09-09 DIAGNOSIS — M159 Polyosteoarthritis, unspecified: Secondary | ICD-10-CM | POA: Diagnosis not present

## 2022-09-09 MED ORDER — DULOXETINE HCL 30 MG PO CPEP
90.0000 mg | ORAL_CAPSULE | Freq: Every day | ORAL | 0 refills | Status: DC
  Filled 2022-09-09: qty 90, 30d supply, fill #0

## 2022-09-09 MED ORDER — LITHIUM CARBONATE 150 MG PO CAPS
150.0000 mg | ORAL_CAPSULE | Freq: Every day | ORAL | 0 refills | Status: DC
Start: 2022-09-09 — End: 2022-10-20
  Filled 2022-09-09 – 2022-09-28 (×2): qty 30, 30d supply, fill #0

## 2022-09-09 MED ORDER — ARIPIPRAZOLE 5 MG PO TABS
5.0000 mg | ORAL_TABLET | Freq: Every day | ORAL | 0 refills | Status: DC
Start: 2022-09-09 — End: 2022-09-21
  Filled 2022-09-09: qty 30, 30d supply, fill #0

## 2022-09-09 NOTE — Telephone Encounter (Signed)
Xanax and modafinil last filled 8/9, due 9/6 and postponed. Other meds sent to [facility].

## 2022-09-09 NOTE — Telephone Encounter (Signed)
Pt called and said that [facility] is closing so all of her scripts need to go to [facility], Her medicines are cymbalta 30 mg lithium carbonate 150 mg, modafinil 100 mg, abilify 5 mgand xanax 0.5 mg.

## 2022-09-10 ENCOUNTER — Other Ambulatory Visit: Payer: Self-pay

## 2022-09-10 ENCOUNTER — Other Ambulatory Visit (HOSPITAL_COMMUNITY): Payer: Self-pay

## 2022-09-10 DIAGNOSIS — J45909 Unspecified asthma, uncomplicated: Secondary | ICD-10-CM | POA: Diagnosis not present

## 2022-09-10 MED ORDER — ALBUTEROL SULFATE HFA 108 (90 BASE) MCG/ACT IN AERS
1.0000 | INHALATION_SPRAY | Freq: Four times a day (QID) | RESPIRATORY_TRACT | 5 refills | Status: AC
  Filled 2022-09-10: qty 18, 30d supply, fill #0
  Filled 2022-10-20 (×2): qty 18, 30d supply, fill #1
  Filled 2022-12-20: qty 18, 30d supply, fill #2

## 2022-09-12 ENCOUNTER — Other Ambulatory Visit (HOSPITAL_COMMUNITY): Payer: Self-pay

## 2022-09-12 MED ORDER — IPRATROPIUM BROMIDE 0.03 % NA SOLN
2.0000 | Freq: Two times a day (BID) | NASAL | 3 refills | Status: DC
  Filled 2022-12-21: qty 30, 43d supply, fill #0
  Filled 2023-01-18 – 2023-02-01 (×2): qty 30, 43d supply, fill #1
  Filled 2023-04-04: qty 30, 43d supply, fill #2
  Filled 2023-05-30 (×2): qty 30, 43d supply, fill #3

## 2022-09-12 MED ORDER — FAMOTIDINE 20 MG PO TABS: 20.0000 mg | ORAL_TABLET | Freq: Two times a day (BID) | ORAL | 3 refills | Status: DC

## 2022-09-12 MED ORDER — ESTRADIOL 0.075 MG/24HR TD PTTW
1.0000 | MEDICATED_PATCH | TRANSDERMAL | 0 refills | Status: DC
  Filled 2023-02-23: qty 8, 28d supply, fill #0
  Filled 2023-03-03: qty 24, 84d supply, fill #0

## 2022-09-12 MED ORDER — FEXOFENADINE HCL 180 MG PO TABS: 180.0000 mg | ORAL_TABLET | Freq: Every day | ORAL | 3 refills | Status: DC

## 2022-09-12 MED ORDER — PROGESTERONE 200 MG PO CAPS
200.0000 mg | ORAL_CAPSULE | Freq: Every day | ORAL | 0 refills | Status: DC
  Filled 2022-12-20: qty 90, 90d supply, fill #0

## 2022-09-12 MED ORDER — ARIPIPRAZOLE 5 MG PO TABS: 5.0000 mg | ORAL_TABLET | Freq: Every day | ORAL | 0 refills | Status: DC

## 2022-09-12 MED ORDER — THYROID 15 MG PO TABS
15.0000 mg | ORAL_TABLET | Freq: Every day | ORAL | 3 refills | Status: DC
  Filled 2023-03-14: qty 90, 90d supply, fill #0

## 2022-09-12 MED ORDER — THYROID 90 MG PO TABS
90.0000 mg | ORAL_TABLET | Freq: Every day | ORAL | 3 refills | Status: DC
  Filled 2022-12-20: qty 90, 90d supply, fill #0
  Filled 2023-03-14 – 2023-03-18 (×2): qty 90, 90d supply, fill #1
  Filled 2023-06-14: qty 90, 90d supply, fill #2
  Filled 2023-09-09: qty 90, 90d supply, fill #3

## 2022-09-12 MED ORDER — PROGESTERONE 200 MG PO CAPS
200.0000 mg | ORAL_CAPSULE | Freq: Every day | ORAL | 3 refills | Status: DC
  Filled 2022-10-07 (×2): qty 90, 90d supply, fill #0

## 2022-09-12 MED ORDER — AZELASTINE HCL 0.1 % NA SOLN
1.0000 | Freq: Two times a day (BID) | NASAL | 5 refills | Status: DC
  Filled 2022-12-21: qty 30, 50d supply, fill #0
  Filled 2023-01-13 – 2023-02-01 (×3): qty 30, 50d supply, fill #1
  Filled 2023-03-03: qty 30, 50d supply, fill #2
  Filled 2023-04-29: qty 30, 50d supply, fill #3
  Filled 2023-06-14: qty 30, 50d supply, fill #4
  Filled 2023-08-24 (×2): qty 30, 50d supply, fill #5

## 2022-09-12 MED ORDER — FEXOFENADINE HCL 180 MG PO TABS
180.0000 mg | ORAL_TABLET | Freq: Every day | ORAL | 3 refills | Status: AC
  Filled 2022-09-30 (×2): qty 90, 90d supply, fill #0

## 2022-09-14 ENCOUNTER — Other Ambulatory Visit (HOSPITAL_COMMUNITY): Payer: Self-pay

## 2022-09-15 ENCOUNTER — Other Ambulatory Visit (HOSPITAL_COMMUNITY): Payer: Self-pay

## 2022-09-15 DIAGNOSIS — L578 Other skin changes due to chronic exposure to nonionizing radiation: Secondary | ICD-10-CM | POA: Diagnosis not present

## 2022-09-15 DIAGNOSIS — Z85828 Personal history of other malignant neoplasm of skin: Secondary | ICD-10-CM | POA: Diagnosis not present

## 2022-09-15 DIAGNOSIS — D225 Melanocytic nevi of trunk: Secondary | ICD-10-CM | POA: Diagnosis not present

## 2022-09-15 DIAGNOSIS — L738 Other specified follicular disorders: Secondary | ICD-10-CM | POA: Diagnosis not present

## 2022-09-15 DIAGNOSIS — L988 Other specified disorders of the skin and subcutaneous tissue: Secondary | ICD-10-CM | POA: Diagnosis not present

## 2022-09-15 DIAGNOSIS — L720 Epidermal cyst: Secondary | ICD-10-CM | POA: Diagnosis not present

## 2022-09-15 DIAGNOSIS — L570 Actinic keratosis: Secondary | ICD-10-CM | POA: Diagnosis not present

## 2022-09-15 DIAGNOSIS — D2271 Melanocytic nevi of right lower limb, including hip: Secondary | ICD-10-CM | POA: Diagnosis not present

## 2022-09-15 DIAGNOSIS — L821 Other seborrheic keratosis: Secondary | ICD-10-CM | POA: Diagnosis not present

## 2022-09-15 MED ORDER — ESTRADIOL 0.075 MG/24HR TD PTTW
1.0000 | MEDICATED_PATCH | TRANSDERMAL | 3 refills | Status: DC
  Filled 2022-10-25: qty 24, 84d supply, fill #0
  Filled 2023-01-13: qty 8, 28d supply, fill #1

## 2022-09-15 MED ORDER — VITAMIN D (ERGOCALCIFEROL) 1.25 MG (50000 UNIT) PO CAPS
50000.0000 [IU] | ORAL_CAPSULE | ORAL | 3 refills | Status: AC
  Filled 2023-01-03: qty 12, 84d supply, fill #0
  Filled 2023-04-04: qty 12, 84d supply, fill #1

## 2022-09-15 MED ORDER — THYROID 90 MG PO TABS
90.0000 mg | ORAL_TABLET | Freq: Every day | ORAL | 3 refills | Status: DC
  Filled 2022-09-17: qty 90, 90d supply, fill #0

## 2022-09-15 MED ORDER — FLUCONAZOLE 150 MG PO TABS: ORAL_TABLET | ORAL | 1 refills | Status: DC

## 2022-09-15 MED ORDER — VITAMIN D (ERGOCALCIFEROL) 1.25 MG (50000 UNIT) PO CAPS
50000.0000 [IU] | ORAL_CAPSULE | ORAL | 4 refills | Status: DC
  Filled 2022-10-07: qty 13, 91d supply, fill #0
  Filled 2022-10-07: qty 13, 90d supply, fill #0

## 2022-09-15 MED ORDER — THYROID 15 MG PO TABS
15.0000 mg | ORAL_TABLET | Freq: Every day | ORAL | 4 refills | Status: DC
  Filled 2022-09-17: qty 90, 90d supply, fill #0
  Filled 2022-12-20: qty 90, 90d supply, fill #1

## 2022-09-15 MED ORDER — LITHIUM CARBONATE 150 MG PO CAPS
150.0000 mg | ORAL_CAPSULE | Freq: Every day | ORAL | 3 refills | Status: DC
Start: 2021-11-12 — End: 2022-09-21

## 2022-09-15 MED ORDER — TRAMADOL HCL 50 MG PO TABS: 50.0000 mg | ORAL_TABLET | Freq: Two times a day (BID) | ORAL | 5 refills | Status: DC | PRN

## 2022-09-15 MED ORDER — TRAMADOL HCL 50 MG PO TABS
50.0000 mg | ORAL_TABLET | Freq: Two times a day (BID) | ORAL | 5 refills | Status: DC | PRN
Start: 2022-09-09 — End: 2023-05-06
  Filled 2022-10-20: qty 60, 30d supply, fill #0
  Filled 2023-01-03: qty 60, 30d supply, fill #1

## 2022-09-16 ENCOUNTER — Other Ambulatory Visit: Payer: Self-pay

## 2022-09-16 ENCOUNTER — Other Ambulatory Visit (HOSPITAL_COMMUNITY): Payer: Self-pay

## 2022-09-16 DIAGNOSIS — F900 Attention-deficit hyperactivity disorder, predominantly inattentive type: Secondary | ICD-10-CM

## 2022-09-16 DIAGNOSIS — G4710 Hypersomnia, unspecified: Secondary | ICD-10-CM

## 2022-09-16 DIAGNOSIS — F411 Generalized anxiety disorder: Secondary | ICD-10-CM

## 2022-09-16 MED ORDER — SILVER SULFADIAZINE 1 % EX CREA
TOPICAL_CREAM | CUTANEOUS | 0 refills | Status: DC
Start: 2022-09-15 — End: 2023-04-11
  Filled 2022-09-16: qty 50, 10d supply, fill #0
  Filled 2022-09-17: qty 50, 30d supply, fill #0

## 2022-09-16 NOTE — Telephone Encounter (Signed)
Pended.

## 2022-09-17 ENCOUNTER — Other Ambulatory Visit (HOSPITAL_COMMUNITY): Payer: Self-pay

## 2022-09-17 ENCOUNTER — Other Ambulatory Visit: Payer: Self-pay

## 2022-09-17 ENCOUNTER — Encounter (HOSPITAL_COMMUNITY): Payer: Self-pay

## 2022-09-17 MED ORDER — MODAFINIL 100 MG PO TABS
ORAL_TABLET | ORAL | 0 refills | Status: DC
  Filled 2022-09-17: qty 60, 30d supply, fill #0

## 2022-09-17 MED ORDER — ALPRAZOLAM 0.5 MG PO TABS
ORAL_TABLET | ORAL | 0 refills | Status: DC
Start: 2022-09-17 — End: 2022-10-20
  Filled 2022-09-17: qty 75, 30d supply, fill #0

## 2022-09-21 ENCOUNTER — Ambulatory Visit (INDEPENDENT_AMBULATORY_CARE_PROVIDER_SITE_OTHER): Payer: Medicare Other | Admitting: Psychiatry

## 2022-09-21 ENCOUNTER — Encounter: Payer: Self-pay | Admitting: Psychiatry

## 2022-09-21 ENCOUNTER — Other Ambulatory Visit (HOSPITAL_COMMUNITY): Payer: Self-pay

## 2022-09-21 DIAGNOSIS — G4710 Hypersomnia, unspecified: Secondary | ICD-10-CM

## 2022-09-21 DIAGNOSIS — F4010 Social phobia, unspecified: Secondary | ICD-10-CM

## 2022-09-21 DIAGNOSIS — F411 Generalized anxiety disorder: Secondary | ICD-10-CM

## 2022-09-21 DIAGNOSIS — F900 Attention-deficit hyperactivity disorder, predominantly inattentive type: Secondary | ICD-10-CM

## 2022-09-21 DIAGNOSIS — F331 Major depressive disorder, recurrent, moderate: Secondary | ICD-10-CM | POA: Diagnosis not present

## 2022-09-21 MED ORDER — DULOXETINE HCL 30 MG PO CPEP
90.0000 mg | ORAL_CAPSULE | Freq: Every day | ORAL | 1 refills | Status: DC
  Filled 2022-09-21: qty 270, 90d supply, fill #0
  Filled 2022-12-20: qty 270, 90d supply, fill #1

## 2022-09-21 MED ORDER — ARIPIPRAZOLE 5 MG PO TABS
5.0000 mg | ORAL_TABLET | Freq: Every day | ORAL | 1 refills | Status: DC
  Filled 2022-09-21: qty 90, 90d supply, fill #0
  Filled 2022-09-30 – 2022-12-20 (×2): qty 90, 90d supply, fill #1

## 2022-09-21 MED ORDER — MODAFINIL 100 MG PO TABS
ORAL_TABLET | ORAL | 5 refills | Status: DC
Start: 2022-09-21 — End: 2023-03-23
  Filled 2022-09-21 – 2022-10-20 (×2): qty 60, 30d supply, fill #0
  Filled 2022-12-20: qty 60, 30d supply, fill #1
  Filled 2023-01-19: qty 60, 30d supply, fill #2
  Filled 2023-02-23: qty 60, 30d supply, fill #3
  Filled 2023-03-16: qty 60, 30d supply, fill #4

## 2022-09-21 NOTE — Progress Notes (Signed)
Lauren Bradley [id number] 11-08-1950 72 y.o.   Subjective:   Patient ID:  Lauren Bradley is a 72 y.o. (DOB 1950-11-22) female.  Chief Complaint:  Chief Complaint  Patient presents with   Follow-up    Mood and anxiety and meds    Depression        Associated symptoms include fatigue, appetite change, myalgias and headaches.  Associated symptoms include no decreased concentration and no suicidal ideas.  Past medical history includes anxiety.   Anxiety Symptoms include nervous/anxious behavior. Patient reports no confusion, decreased concentration, dizziness, palpitations, shortness of breath or suicidal ideas.    Lauren Bradley presents  today for follow-up of depression, anxiety and insomnia and chronic pain.   At visit Jun 08, 2018.  No major meds were changed except she was trying to gradually increase the Abilify to 2 mg daily for optimal mood effect.  I feel much better on the Abilify.  Makes my brain feel more normal and focus is better and calmer overall.   visit August 31, 2018.  She had asked to increase Abilify from 2 to 3 mg daily.  She had seen Abilify benefit at 2 mg but wished for additional benefit for mood and anxiety.  visit November 2020.  The following was noted: Has done well on Abilify 3 mg with additional improvement in mood, but still has a lot of anxiety.  Dr. Valentina Lucks noticed the difference in her mood.  Managed the weight and lost to baseline.  Wonders about the increase to 5 mg to help anxiety.  Benefit for energy which is reasonable with less napping than before Abilify.  Took half Provigil last week when driving distance and did ok with it.   Helped ADHD.  Function is better.  Still some days flounder.  Not as good as paperwork years ago but better with Abilify.  Helped having Weston Brass at home.  Was poor function before Abilify despite trying hard. Abilify helped the depression clearly and very beneficial.  Not always sad now.  HA resolved.  Initially it caused  great energy like a motor running and reduced need for sleep to 5-6 hours.  That has resolved.  SE weight gain without increasing calories.  Changed her sleep schedule for the better.  To bed earlier and up earlier.    I didn't realize how depressed I was.  Better organization and function and productivity.  Abilify helped regulate and correct her sleep wake schedule.  Not oversleeping now. Anxiety remains high chronically.  Can talk too much when anxious and her accountant commented on it to her.  QT has been good for her.  Chronic social anxiety.  Can be crippled from anxiety.   Plan: Trial increase Abilify 5 mg daily in hopes of further reducing anxiety and depressive symptoms.  05/31/2019 appointment the following is noted: Sleep well with naltrexone and alprazolam 0.25 mg HS. Better energy and alertness with modafinil 100 but a little less effect over time.  Some degree of tolerance. Hard to get paperwork done. Done well with aripiprazole 5 mg daily. Helped energy and coping with calmer manner. So much less depressed with Abilify.  Concerns over Hgb A1C which had been borderline in the past and is up a little to 5.9 now.  No extra weight at this point.  Is exercising.    Stressed still dealing with hypochondriac mother with constant somatization and complaining and anxiety and frequently wanting to go to the ER.  Better handling it with  Abilify.  Disc management with this. Mother compulsively and annoyingly call her repeatedly.  Feels shame over the feelings she has about her mother.  Major boundary issues from her mother.  Mother was neglectful in her childhood.  Recognizes some blacked out memories from her childhood.  Asked how to deal with this with mother.  09/11/19 appt with the following noted: Not quite as well with stress from mother bc B doesn't help much. Concerned about Abilify poss effecting glucose.  Did have high AIC in past even before Abilify.  Has to watch carbs.  Plans to see PCP  Dr. Ronny Flurry again about it. Poor heat tolerance this year.  Wonders if related to modafinil.  Went up to 150 mg and didn't notice a lot of issues.   No SE otherwise.  Is able to do some paperwork more effectively lately.  More pain intolerant as well. No problems with sleep affected by modafinil.  2 dogs sleep with her.  Cough can wake her worse off tramadol.   Lost her dog to pancreatic CA.  Good help with homeopathic vet.  Feels guilty he had fever and she didn't realize it at the time. Plan: no med changes.  Continue Abilfy 5 which helped and increased modafinil to 150. And other meds.  11/21/2019 appointment with the following noted: Stress care of mother and needs help.  Wonders about moving mother in to help financially.  House needs work. Does everything for mother now.    B's son shot himself at her mother's home. Doesn't feel B respects mother and it causes problems. Realized big issues wiith ExH.  [name]'s graduation brought up some of this she thought she had addressed. Plan: no med changes  01/25/2020 appointment with the following noted: Modafinil 150 mg helped daytime sleepiness.   Sleep has been reset somewhat to a more typical sleep pattern.  Able to stay awake driving better now with modafinil.  Abilify helped energy and reset body clock.   Mo is still a handful.  When not deeply depressed she wants to shop.  Focus hard for mother.  Also concerned about paranoia issues with mother. Angst about dx of new skin diagnosis.   Stress ExH with someone known for a long time and upsetting.  Lost 10# over that.  4-5# underweight.   Also uncertainty about whether or not to get a job.  M is very picky eater and difficult to prepare food for her.  M is very time demanding and loses things.   Pt dx with PXE.  Plan further improvement was noted with Abilify increased to 5 mg daily so no med changes today  04/24/2020 appointment with following noted: [name] comes to appt next week.   Disc  boundary setting with mother who can be overwhelming. Disc Questions around Chronic viral illness complications.  She got better with diet changes and weight lifting. Worked on CBT to deal with negative thinking and mother.  Talks with her daily. Hardd to deal with [name]'s GF. Plan no med changes  06/25/2020 appointment with the following noted: Disc concerns about Covid vaccine and questions.  A lot of fatigue. Exercise a lot more.   Stress alimony drops August 1 and worry.  Also with drop in stock market. Causing anxiety.   Gets 7 hours sleep but weekends 8-9 hours.   Keep wanting to wean Xanax.  Disc concerns. Disc relationship concerns around Ex. Not shakey or jittery. Exercise twice daily.  Busy all day. Plan: Increase modafinil  150 mg and 50 mg Noon for hypersomnolence and ADD.    09/17/2020 appt noted: Stress over alimony ending. Some recennt conflict with Ex and son getting drug into the middle of it.  Angry email from Ex H.  Wants advice about how to deal with Weston Brass and respond to the situation.  Extensive discussion over stressors related to family.  Wants to decide to respond in manner that does not hurt her interests. Patient reports stable mood and denies depressed or irritable moods.  .  Patient denies difficulty with sleep initiation or maintenance. Denies appetite disturbance.  Patient reports that energy and motivation have been good.  Patient denies any difficulty with concentration.  Patient denies any suicidal ideation.  11/24/20 appt noted; Botox helps migraine. But worse lately. Poor memory concerning.  Worrying over alimony drop in Sept.   On Abilify 5, duloxetine 90, modafinil 150, Xanax 0.5 mg 2 at night and 1 prn anxiety. Disc concerns over's [name]'s confidence level. Mood is OK.  Does have lunch with friends monthly. Stress with needy mother who calls constantly. Plan no med changes  03/18/21 appt noted: Learning Spanish to help brain health. Disc relationship  issues. Still care taking M and uncle and somewhat for son.  Worries over son. M mentally worse last 3-4 weeks and somatically focused telling people she was going to die. B Raiford Noble took her to [facility]. She was OK. Enjoys exercise daily. Stays busy.  Never bored or lonely.  Don't want to marry. No problems with meds. Chronic issues with Ex being problematic. Plan no med changes  06/03/21 appt noted: Feels best with modafinil.  Sinks about midday.   Still concerns about memory.   She followed through with relationship pursuit, facing her anxiety. Not consuming her.   Stays really busy with things.  Learning Spanish while exercising.   Using Du[employer] Lingo. Mood is ok.  Some anxiety over finances.  Chronic stress there. Plan: no med changes  09/08/21 appt noted: Problems with mother.  CO ongoing pain.  Flipped her lid.  Wouldn't eat for awhile.  Became immobile for awhile.  Took to [facility] ER.  DX gastroenteritis.  Pleased with SW there Hinton Lovely.  Got her into rehab in [place] area for 12 hours.   She's back at [facility] with round the clock care sitter.  Which she likes.  She wants someone to be with her all the time chronically. Guilts pt into wanting more care but expensive.   Memory is worse with mother.   Weston Brass is extremely depressed also and really worries her.  She's having to help that too.  He's very stubborn.   Concerns about Weston Brass and suicide risk.   Plan: no med changes  12/10/21 appt noted:  Psych meds: duloxetine 90, Abilify 5, modafinil 150-200 mg AM, lithium 150 mg daily, alprazolam 0.25 mg HS. Disc stress dealing with mother.  Did not do well with less meds.  M dependent on her markedly.  M will not do things for herself.  M is very needy and negative and not insightful.  M keeps her so busy.  Also helping uncle.   She's been doing pretty well.  Mood is OK.  Stress of $ limitation.   No SE issues with meds.  Satisfied with meds. Learning Spanish for her brain.    Exercises on bike daily. Took part in study.   Weston Brass is less depressed.   Some awakening with coughing Plan: No med changes. : duloxetine 90,  Abilify 5, modafinil 150-200 mg AM, lithium 150 mg daily, NAC 1200 mg daily, Xanax 0.25 mg HS  03/16/22 appt noted: High demands work load 7 days per week caring for family and her home.  Uncle calls daily.  Uncle telss her to take some breaks.  I'm a volcano.   Looking at asst living in [place] to be closer for M but ambivalent about it.  M demanding of time.  M will wake her.  M fell and had to go to ER.  72 yo.  Very waring and unappreciative.  Pt is losing wt this year.  92#.  But is exercising. M may be moving to [place].  Ambivalent. M calls in the middle of the night.  Interferes with sleep.   Weston Brass is less depressed but doesn't like what he's doing with his father.  Lost his confidence.   Peyton Najjar is going to get remarried.  No med changes desired.  Can't afford to update her home and considering moving. Plan No med changes. : duloxeitne 90, Abilify 5, modafinil 150-200 mg AM, lithium 150 mg daily, NAC 1200 mg daily, Xanax 0.25 mg HS  09/21/22 appt noted: Meds same. Really tired.  Still exercising.  PCP workup anemia. Needs iron.  Sometimes M will call her in middle of night to go to ER for reasons that are not emergencies.  It upsets her and then can't go back to sleep.  Guilt provoking statements from M.   Pt gave her a better clock for memory impairment. M living at facility [facility] and still calls.  Sees her 3-4 times weekly.  Does her laundry and ironing.   Stays busy.  Also helps uncle.   Generally can be upbeat if left alone by M. No health problems that are new.   Ongoing worry over $ in the longterm.  Active mentally.   Real sleepy without modafinil.   Some social anxiety still significant.  Past Psychiatric Medication Trials: Abilify 3 mg helpful,  duloxetine 90, venlafaxine irritable, buspirone side effects, trycyclic  antidepressants, Wellbutrin, Zoloft, Paxil, Modafinil 150, Vyvanse 30, Ritalin, Adderall,   propranolol Belsomra, Lunesta, Ambien, trazodone, alprazolam, doxepin, mirtazapine, clonazepam  Review of Systems:  Review of Systems  Constitutional:  Positive for appetite change, fatigue and unexpected weight change.  Respiratory:  Positive for cough. Negative for shortness of breath.   Cardiovascular:  Negative for palpitations.  Musculoskeletal:  Positive for arthralgias, back pain and myalgias. Negative for gait problem.  Skin:  Positive for rash.  Neurological:  Positive for headaches. Negative for dizziness and tremors.  Psychiatric/Behavioral:  Positive for depression. Negative for behavioral problems, confusion, decreased concentration, dysphoric mood, hallucinations, self-injury, sleep disturbance and suicidal ideas. The patient is nervous/anxious. The patient is not hyperactive.     Medications: I have reviewed the patient's current medications.  Current Outpatient Medications  Medication Sig Dispense Refill   albuterol (VENTOLIN HFA) 108 (90 Base) MCG/ACT inhaler Inhale 2 puffs into the lungs every 6 (six) hours as needed for wheezing or shortness of breath.     albuterol (VENTOLIN HFA) 108 (90 Base) MCG/ACT inhaler Inhale 1 puff into the lungs every 6 (six) hours. 18 g 5   ALPRAZolam (XANAX) 0.5 MG tablet Take 2 tablets (1 mg total) by mouth at bedtime. May also take 1 tablet (0.5 mg total) daily as needed. 75 tablet 0   ARIPiprazole (ABILIFY) 5 MG tablet Take 1 tablet (5 mg total) by mouth daily. 30 tablet 0   ARIPiprazole (ABILIFY)  5 MG tablet Take 1 tablet (5 mg total) by mouth daily. 90 tablet 0   azelastine (ASTELIN) 0.1 % nasal spray      azelastine (ASTELIN) 0.1 % nasal spray Place 1 spray into both nostrils 2 (two) times daily. 30 mL 5   botulinum toxin Type A (BOTOX) 200 units injection Provider to inject 155 units intramuscularly into head and neck every 12 weeks. Discard  remainder. 1 each 1   calcium carbonate (OS-CAL) 600 MG TABS tablet Take 600 mg by mouth.     DULoxetine (CYMBALTA) 30 MG capsule Take 3 capsules (90 mg total) by mouth daily. 90 capsule 0   EPINEPHrine (EPIPEN JR) 0.15 MG/0.3ML injection      estradiol (DOTTI) 0.075 MG/24HR Place 1 patch onto the skin 2 (two) times a week as directed. 24 patch 0   estradiol (DOTTI) 0.075 MG/24HR Place 1 patch onto the skin 2 (two) times a week. 24 patch 3   estradiol (VIVELLE-DOT) 0.075 MG/24HR estradiol 0.075 mg/24 hr semiweekly transdermal patch  APPLY 1 PATCH TO SKIN TWICE WEEKLY AS DIRECTED     famotidine (PEPCID) 20 MG tablet Take 1 tablet (20 mg total) by mouth every evening after supper 30 tablet 11   famotidine (PEPCID) 20 MG tablet Take 1 tablet (20 mg total) by mouth 2 (two) times daily. 180 tablet 3   fexofenadine (ALLEGRA ALLERGY) 180 MG tablet Take 1 tablet (180 mg total) by mouth daily. 90 tablet 3   fexofenadine (ALLEGRA) 180 MG tablet Take 1 tablet (180 mg total) by mouth daily. 30 tablet 3   fluconazole (DIFLUCAN) 150 MG tablet Take 1 tablet by mouth when symptons begin, then take 1 tablet 3 days later 2 tablet 1   ipratropium (ATROVENT) 0.03 % nasal spray Place 2 sprays into both nostrils 2 (two) times daily. 30 mL 3   Levomefolate Glucosamine (METHYLFOLATE PO) Take by mouth.     lithium carbonate 150 MG capsule Take 1 capsule (150 mg total) by mouth daily. 30 capsule 0   lithium carbonate 150 MG capsule Take 1 capsule (150 mg total) by mouth daily. 90 capsule 3   MAGNESIUM MALATE PO Take by mouth daily.     modafinil (PROVIGIL) 100 MG tablet Take 1.5 tablets (150 mg total) by mouth in the morning AND 0.5 tablets (50 mg total) daily at 12 noon. 60 tablet 0   NP THYROID 15 MG tablet Take 15 mg by mouth daily.     ondansetron (ZOFRAN-ODT) 4 MG disintegrating tablet Dissolve 1-2 tablets (4-8 mg total) by mouth every 8 (eight) hours as needed. 30 tablet 3   progesterone (PROMETRIUM) 200 MG capsule  Take by mouth.     progesterone (PROMETRIUM) 200 MG capsule Take 1 capsule (200 mg total) by mouth at bedtime. 90 capsule 3   progesterone (PROMETRIUM) 200 MG capsule Take 1 capsule (200 mg total) by mouth at bedtime. 90 capsule 0   silver sulfADIAZINE (SILVADENE) 1 % cream Apply to affected area 2 times a day for 10 days 50 g 0   SUMAtriptan (IMITREX) 100 MG tablet Take 1 tablet by mouth at onset migraine. May repeat in 2 hours if needed (max 2 tabs/24 hours) 9 tablet 11   thyroid (ARMOUR THYROID) 15 MG tablet Take 1 tablet (15 mg total) by mouth daily on an empty stomach. 90 tablet 3   thyroid (ARMOUR THYROID) 90 MG tablet Take 1 tablet (90 mg total) by mouth daily on an empty stomach. 90 tablet  3   thyroid (ARMOUR) 15 MG tablet Take 1 tablet (15 mg total) by mouth daily on an empty stomach. 90 tablet 4   thyroid (ARMOUR) 90 MG tablet TAKE 1 TABLET BY MOUTH EVERY DAY ON AN EMPTY STOMACH WITH 15 MG DOSE     thyroid (ARMOUR) 90 MG tablet Take 1 tablet (90 mg total) by mouth daily on an empty stomach (take with 15mg ) 90 tablet 3   traMADol (ULTRAM) 50 MG tablet Take 1 tablet (50 mg total) by mouth 2 (two) times daily as needed. 60 tablet 5   traMADol (ULTRAM) 50 MG tablet Take 1 tablet (50 mg total) by mouth 2 (two) times daily as needed for cough 60 tablet 5   tretinoin (RETIN-A) 0.025 % cream      Ubrogepant (UBRELVY) 100 MG TABS Take 100 mg by mouth every 2 (two) hours as needed. Maximum 200mg  a day. 16 tablet 11   Vitamin D, Ergocalciferol, (DRISDOL) 1.25 MG (50000 UNIT) CAPS capsule Take 1 capsule (50,000 Units total) by mouth once a week. 13 capsule 4   Vitamin D, Ergocalciferol, (DRISDOL) 1.25 MG (50000 UNIT) CAPS capsule Take 1 capsule (50,000 Units total) by mouth once a week. 12 capsule 3   Vitamin D, Ergocalciferol, (DRISDOL) 50000 units CAPS capsule Take 50,000 Units by mouth every 7 (seven) days.      No current facility-administered medications for this visit.    Medication Side  Effects: HA stopped with Abilify, weight gain, hungry is odd  Allergies:  Allergies  Allergen Reactions   Hydrocodone Itching   Codeine Nausea And Vomiting   Other    Sulfa Antibiotics    Cephalosporins Rash   Sulfamethoxazole Rash    Unknown reaction    Past Medical History:  Diagnosis Date   Asthma    DDD (degenerative disc disease), cervical    DDD (degenerative disc disease), lumbar    Fatigue    Fibromyalgia    Infertility, female    Osteoarthritis     Family History  Problem Relation Age of Onset   Fibromyalgia Mother    Kidney failure Mother    Mental illness Mother    Migraines Mother    Heart disease Father    COPD Father    Cancer Father        colon, liver    Migraines Paternal Aunt    Hypothyroidism Son     Social History   Socioeconomic History   Marital status: Single    Spouse name: Not on file   Number of children: Not on file   Years of education: Not on file   Highest education level: Not on file  Occupational History   Not on file  Tobacco Use   Smoking status: Never   Smokeless tobacco: Never  Vaping Use   Vaping status: Never Used  Substance and Sexual Activity   Alcohol use: Not Currently   Drug use: No   Sexual activity: Not on file  Other Topics Concern   Not on file  Social History Narrative   Not on file   Social Determinants of Health   Financial Resource Strain: Not on file  Food Insecurity: No Food Insecurity (02/26/2020)   Received from [facility], [facility]   Hunger Vital Sign    Worried About Running Out of Food in the Last Year: Never true    Ran Out of Food in the Last Year: Never true  Transportation Needs: Not on file  Physical Activity:  Not on file  Stress: Not on file  Social Connections: Unknown (05/13/2021)   Received from [facility], [facility]   Social Network    Social Network: Not on file  Intimate Partner Violence: Unknown (04/15/2021)   [facility], [facility]    HITS    Physically Hurt: Not on file    Insult or Talk Down To: Not on file    Threaten Physical Harm: Not on file    Scream or Curse: Not on file    Past Medical History, Surgical history, Social history, and Family history were reviewed and updated as appropriate.   Please see review of systems for further details on the patient's review from today.   Objective:   Physical Exam:  There were no vitals taken for this visit.  Physical Exam Constitutional:      General: She is not in acute distress. Musculoskeletal:        General: No deformity.  Neurological:     Mental Status: She is alert and oriented to person, place, and time.     Cranial Nerves: No dysarthria.     Coordination: Coordination normal.  Psychiatric:        Attention and Perception: Attention and perception normal. She does not perceive auditory or visual hallucinations.        Mood and Affect: Mood is anxious. Mood is not depressed. Affect is not angry, tearful or inappropriate.        Speech: Speech normal. Speech is not slurred.        Behavior: Behavior normal. Behavior is cooperative.        Thought Content: Thought content normal. Thought content is not paranoid or delusional. Thought content does not include homicidal or suicidal ideation. Thought content does not include suicidal plan.        Cognition and Memory: Cognition and memory normal.        Judgment: Judgment normal.     Comments: Insight fair to good.  Depression has almost resolved with the Abilify for the most part.   Anxiety remains moderate under a lot of stress    Lab Review:     Component Value Date/Time   NA 137 02/21/2017 1635   K 4.3 02/21/2017 1635   CL 102 02/21/2017 1635   CO2 29 02/21/2017 1635   GLUCOSE 88 02/21/2017 1635   BUN 16 02/21/2017 1635   CREATININE 0.58 02/21/2017 1635   CALCIUM 9.4 02/21/2017 1635   PROT 6.8 02/21/2017 1635   ALBUMIN 4.4 12/30/2015 1507   AST 22 02/21/2017 1635   ALT 20 02/21/2017 1635    ALKPHOS 28 (L) 12/30/2015 1507   BILITOT 0.3 02/21/2017 1635   GFRNONAA 97 02/21/2017 1635   GFRAA 112 02/21/2017 1635       Component Value Date/Time   WBC 6.6 02/21/2017 1635   RBC 4.36 02/21/2017 1635   HGB 12.9 02/21/2017 1635   HCT 38.2 02/21/2017 1635   PLT 327 02/21/2017 1635   MCV 87.6 02/21/2017 1635   MCH 29.6 02/21/2017 1635   MCHC 33.8 02/21/2017 1635   RDW 12.4 02/21/2017 1635   LYMPHSABS 1,228 02/21/2017 1635   MONOABS 402 12/30/2015 1507   EOSABS 132 02/21/2017 1635   BASOSABS 73 02/21/2017 1635    No results found for: "POCLITH", "LITHIUM"   No results found for: "PHENYTOIN", "PHENOBARB", "VALPROATE", "CBMZ"   .res Assessment: Plan:    Major depressive disorder, recurrent episode, moderate (HCC)  Generalized anxiety disorder  Social anxiety disorder  Attention deficit hyperactivity disorder (ADHD), predominantly inattentive type  Hypersomnolence disorder, persistent   40 min face to face time with patient was spent on counseling and coordination of care. We discussed Less depressed with the Abilify clear benefit for depression and anxiety.   It's helped productivity and mood and energy and less hypersomnolence. Continue Abilify to 5 mg daily.   Disc glucose and A1c again.  It is better again.      Option check labs and Hgb A1C bc in past was prediabetic.  We discussed the short-term risks associated with benzodiazepines including sedation and increased fall risk among others.  Discussed long-term side effect risk including dependence, potential withdrawal symptoms, and the potential eventual dose-related risk of dementia.  But recent studies from 2020 dispute this association between benzodiazepines and dementia risk. Newer studies in 2020 do not support an association with dementia. Extensive discussion of it.   She was successful at reducing Xanax for sleep.  Discussed potential metabolic side effects associated with atypical antipsychotics, as well  as potential risk for movement side effects. Advised pt to contact office if movement side effects occur.   Continue duloxetine 90.Marland Kitchen  Disc risk sweating  And heat intolerance with it.  Continue modafinil 150 mg and 50 mg Noon for hypersomnolence and ADD.  She doesn't tolerate traditional stimulants like Adderall and MPH..She has gotten benefit with it.  Disc pros and cons of increasing.  Protect sleep and try to get more if possible.    Modafinil and Abilify have corrected delayed sleep phase disorder.  Lithium 150 mg for neurocognitive protection.  Disc can increase WBC.  Counseling 20 min: Supportive Problem solving therapy and supportive interventions around dealing with her mother and her mother's chronic health complaints.  M is too old to learn new things at this point.  Disc boundaries with mother.  Disc managing mother's calls in middle of the night.   Disc concerns about getting [name]'s mental health addressed.   Previously disc Protect wt and disc risk osteoporosis.    No med changes. : duloxeitne 90, Abilify 5, modafinil 100 mg AM and 50 mg Noon, lithium 150 mg daily, NAC 1200 mg daily, Xanax 0.25 mg HS  FU 5-6 mos  Meredith Staggers, MD, DFAPA   Please see After Visit Summary for patient specific instructions.  Future Appointments  Date Time Provider Department Center  09/23/2022 12:30 PM [facility] 2 [facility]  10/19/2022  2:00 PM Anson Fret, MD [facility] None  02/01/2023  2:00 PM Pollyann Savoy, MD [facility] None    No orders of the defined types were placed in this encounter.      -------------------------------

## 2022-09-22 ENCOUNTER — Other Ambulatory Visit: Payer: Self-pay

## 2022-09-22 ENCOUNTER — Other Ambulatory Visit (HOSPITAL_COMMUNITY): Payer: Self-pay

## 2022-09-23 ENCOUNTER — Ambulatory Visit (HOSPITAL_COMMUNITY)
Admission: RE | Admit: 2022-09-23 | Discharge: 2022-09-23 | Disposition: A | Discharge status: Home / Self Care | Payer: Medicare Other | Source: Ambulatory Visit | Attending: Internal Medicine | Admitting: Internal Medicine

## 2022-09-23 DIAGNOSIS — Z8249 Family history of ischemic heart disease and other diseases of the circulatory system: Secondary | ICD-10-CM | POA: Insufficient documentation

## 2022-09-24 ENCOUNTER — Other Ambulatory Visit (HOSPITAL_COMMUNITY): Payer: Self-pay

## 2022-09-28 ENCOUNTER — Other Ambulatory Visit: Payer: Self-pay

## 2022-09-28 ENCOUNTER — Other Ambulatory Visit (HOSPITAL_COMMUNITY): Payer: Self-pay

## 2022-09-30 ENCOUNTER — Other Ambulatory Visit (HOSPITAL_COMMUNITY): Payer: Self-pay

## 2022-10-07 ENCOUNTER — Other Ambulatory Visit (HOSPITAL_COMMUNITY): Payer: Self-pay

## 2022-10-08 ENCOUNTER — Other Ambulatory Visit (HOSPITAL_COMMUNITY): Payer: Self-pay

## 2022-10-19 ENCOUNTER — Ambulatory Visit (INDEPENDENT_AMBULATORY_CARE_PROVIDER_SITE_OTHER): Payer: Medicare Other | Admitting: Neurology

## 2022-10-19 DIAGNOSIS — G43709 Chronic migraine without aura, not intractable, without status migrainosus: Secondary | ICD-10-CM

## 2022-10-19 MED ORDER — ONABOTULINUMTOXINA 200 UNITS IJ SOLR
155.0000 [IU] | Freq: Once | INTRAMUSCULAR | Status: AC
Start: 2022-10-19 — End: 2022-10-19
  Administered 2022-10-19: 155 [IU] via INTRAMUSCULAR

## 2022-10-19 NOTE — Progress Notes (Signed)
Botox- 200 units x 1 vial Lot: [id number] Expiration: 02/2025 NDC: 4782-9562-13  Bacteriostatic 0.9% Sodium Chloride- 4 mL  Lot: [id number] Expiration: 04/12/2023 NDC: 4696-2952-84  Dx: X32.440  B/B Witnessed by Elana Alm

## 2022-10-19 NOTE — Progress Notes (Signed)
Consent Form Botulism Toxin Injection For Chronic Migraine  10/19/2022: stable, > 50% improvement in migraine freq and severity 07/27/22: she has cts and thumb bone on bone sees gramig. Getting gaston treatment for her right muscle tightness, sam(antha) as [facility], stable still doing great on botoc 05/04/2022: stable  04/10/2022: Stable, doing well 11/11/2021 Transfer of care from Dr. Antonietta Jewel. she had 25 migraine days a month without aura(rarely will she have an aura), moderately to severe, would last 24-72 hours untreated, photophobia/phonophobia, smells would trigger, light was the worst, botox has significantly improved her life, changed her life, she has been having botox for years and continues to improve quality of life, no medication overuse, she only gets 4 migraines a month and < 10 total headache days a month now with botox.  Gave her info on [place].   Reviewed orally with patient, additionally signature is on file:  Botulism toxin has been approved by the Federal drug administration for treatment of chronic migraine. Botulism toxin does not cure chronic migraine and it may not be effective in some patients.  The administration of botulism toxin is accomplished by injecting a small amount of toxin into the muscles of the neck and head. Dosage must be titrated for each individual. Any benefits resulting from botulism toxin tend to wear off after 3 months with a repeat injection required if benefit is to be maintained. Injections are usually done every 3-4 months with maximum effect peak achieved by about 2 or 3 weeks. Botulism toxin is expensive and you should be sure of what costs you will incur resulting from the injection.  The side effects of botulism toxin use for chronic migraine may include:   -Transient, and usually mild, facial weakness with facial injections  -Transient, and usually mild, head or neck weakness with head/neck injections  -Reduction or loss of forehead facial  animation due to forehead muscle weakness  -Eyelid drooping  -Dry eye  -Pain at the site of injection or bruising at the site of injection  -Double vision  -Potential unknown long term risks  Contraindications: You should not have Botox if you are pregnant, nursing, allergic to albumin, have an infection, skin condition, or muscle weakness at the site of the injection, or have myasthenia gravis, Lambert-Eaton syndrome, or ALS.  It is also possible that as with any injection, there may be an allergic reaction or no effect from the medication. Reduced effectiveness after repeated injections is sometimes seen and rarely infection at the injection site may occur. All care will be taken to prevent these side effects. If therapy is given over a long time, atrophy and wasting in the muscle injected may occur. Occasionally the patient's become refractory to treatment because they develop antibodies to the toxin. In this event, therapy needs to be modified.  I have read the above information and consent to the administration of botulism toxin.    BOTOX PROCEDURE NOTE FOR MIGRAINE HEADACHE    Contraindications and precautions discussed with patient(above). Aseptic procedure was observed and patient tolerated procedure. Procedure performed by Dr. Artemio Aly  The condition has existed for more than 6 months, and pt does not have a diagnosis of ALS, Myasthenia Gravis or Lambert-Eaton Syndrome.  Risks and benefits of injections discussed and pt agrees to proceed with the procedure.  Written consent obtained  These injections are medically necessary. Pt  receives good benefits from these injections. These injections do not cause sedations or hallucinations which the oral therapies may cause.  Description  of procedure:  The patient was placed in a sitting position. The standard protocol was used for Botox as follows, with 5 units of Botox injected at each site:   -Procerus muscle, midline  injection  -Corrugator muscle, bilateral injection  -Frontalis muscle, bilateral injection, with 2 sites each side, medial injection was performed in the upper one third of the frontalis muscle, in the region vertical from the medial inferior edge of the superior orbital rim. The lateral injection was again in the upper one third of the forehead vertically above the lateral limbus of the cornea, 1.5 cm lateral to the medial injection site.  -Temporalis muscle injection, 4 sites, bilaterally. The first injection was 3 cm above the tragus of the ear, second injection site was 1.5 cm to 3 cm up from the first injection site in line with the tragus of the ear. The third injection site was 1.5-3 cm forward between the first 2 injection sites. The fourth injection site was 1.5 cm posterior to the second injection site.   -Occipitalis muscle injection, 3 sites, bilaterally. The first injection was done one half way between the occipital protuberance and the tip of the mastoid process behind the ear. The second injection site was done lateral and superior to the first, 1 fingerbreadth from the first injection. The third injection site was 1 fingerbreadth superiorly and medially from the first injection site.  -Cervical paraspinal muscle injection, 2 sites, bilateral knee first injection site was 1 cm from the midline of the cervical spine, 3 cm inferior to the lower border of the occipital protuberance. The second injection site was 1.5 cm superiorly and laterally to the first injection site.  -Trapezius muscle injection was performed at 3 sites, bilaterally. The first injection site was in the upper trapezius muscle halfway between the inflection point of the neck, and the acromion. The second injection site was one half way between the acromion and the first injection site. The third injection was done between the first injection site and the inflection point of the neck.   Will return for repeat injection  in 3 months.   200 units of Botox was used, 45U Botox not injected was wasted. The patient tolerated the procedure well, there were no complications of the above procedure.

## 2022-10-20 ENCOUNTER — Other Ambulatory Visit: Payer: Self-pay

## 2022-10-20 ENCOUNTER — Other Ambulatory Visit (HOSPITAL_COMMUNITY): Payer: Self-pay

## 2022-10-20 ENCOUNTER — Other Ambulatory Visit: Payer: Self-pay | Admitting: Psychiatry

## 2022-10-20 DIAGNOSIS — F331 Major depressive disorder, recurrent, moderate: Secondary | ICD-10-CM

## 2022-10-20 DIAGNOSIS — F411 Generalized anxiety disorder: Secondary | ICD-10-CM

## 2022-10-20 MED ORDER — ALPRAZOLAM 0.5 MG PO TABS
ORAL_TABLET | ORAL | 0 refills | Status: DC
  Filled 2022-10-20: qty 75, 30d supply, fill #0

## 2022-10-20 MED ORDER — LITHIUM CARBONATE 150 MG PO CAPS
150.0000 mg | ORAL_CAPSULE | Freq: Every day | ORAL | 3 refills | Status: DC
  Filled 2022-10-20 – 2022-10-25 (×2): qty 90, 90d supply, fill #0
  Filled 2023-01-24 – 2023-01-31 (×3): qty 90, 90d supply, fill #1
  Filled 2023-04-29: qty 90, 90d supply, fill #2
  Filled 2023-07-29: qty 90, 90d supply, fill #3

## 2022-10-20 MED FILL — Famotidine Tab 20 MG: ORAL | 30 days supply | Qty: 30 | Fill #0 | Status: CN

## 2022-10-20 MED FILL — Famotidine Tab 20 MG: ORAL | 30 days supply | Qty: 30 | Fill #0 | Status: AC

## 2022-10-20 NOTE — Telephone Encounter (Signed)
Lithium lf 09/28/22- not appropriate- due 10/15  Alprazolam lf 09/17/22 - rf appropriate   Lv 09/21/22; nv 02/21/22

## 2022-10-21 ENCOUNTER — Other Ambulatory Visit (HOSPITAL_COMMUNITY): Payer: Self-pay

## 2022-10-21 ENCOUNTER — Other Ambulatory Visit: Payer: Self-pay

## 2022-10-25 ENCOUNTER — Other Ambulatory Visit (HOSPITAL_COMMUNITY): Payer: Self-pay

## 2022-10-25 ENCOUNTER — Other Ambulatory Visit: Payer: Self-pay

## 2022-11-08 DIAGNOSIS — M205X2 Other deformities of toe(s) (acquired), left foot: Secondary | ICD-10-CM | POA: Diagnosis not present

## 2022-11-08 DIAGNOSIS — M7742 Metatarsalgia, left foot: Secondary | ICD-10-CM | POA: Diagnosis not present

## 2022-11-08 DIAGNOSIS — M25372 Other instability, left ankle: Secondary | ICD-10-CM | POA: Diagnosis not present

## 2022-11-08 DIAGNOSIS — M7741 Metatarsalgia, right foot: Secondary | ICD-10-CM | POA: Diagnosis not present

## 2022-11-08 DIAGNOSIS — S90212A Contusion of left great toe with damage to nail, initial encounter: Secondary | ICD-10-CM | POA: Diagnosis not present

## 2022-11-09 DIAGNOSIS — Z23 Encounter for immunization: Secondary | ICD-10-CM | POA: Diagnosis not present

## 2022-11-09 DIAGNOSIS — D649 Anemia, unspecified: Secondary | ICD-10-CM | POA: Diagnosis not present

## 2022-11-15 DIAGNOSIS — J301 Allergic rhinitis due to pollen: Secondary | ICD-10-CM | POA: Diagnosis not present

## 2022-11-15 DIAGNOSIS — J4520 Mild intermittent asthma, uncomplicated: Secondary | ICD-10-CM | POA: Diagnosis not present

## 2022-12-13 NOTE — Progress Notes (Unsigned)
Office Visit Note  Patient: Lauren Bradley             Date of Birth: [birth year]           MRN: [id number]             PCP: Oneita Hurt, No Referring: Kirby Funk, MD Visit Date: 12/22/2022 Occupation: @GUAROCC @  Subjective:  No chief complaint on file.   History of Present Illness: Lauren Bradley is a 72 y.o. female ***     Activities of Daily Living:  Patient reports morning stiffness for *** {minute/hour:19697}.   Patient {ACTIONS;DENIES/REPORTS:21021675::"Denies"} nocturnal pain.  Difficulty dressing/grooming: {ACTIONS;DENIES/REPORTS:21021675::"Denies"} Difficulty climbing stairs: {ACTIONS;DENIES/REPORTS:21021675::"Denies"} Difficulty getting out of chair: {ACTIONS;DENIES/REPORTS:21021675::"Denies"} Difficulty using hands for taps, buttons, cutlery, and/or writing: {ACTIONS;DENIES/REPORTS:21021675::"Denies"}  No Rheumatology ROS completed.   PMFS History:  Patient Active Problem List   Diagnosis Date Noted   Upper airway cough syndrome 01/15/2021   Major depressive disorder, single episode, severe (HCC) 10/06/2017   History of vitamin D deficiency 10/12/2016   History of seasonal allergies 12/29/2015   Fibromyalgia 12/29/2015   Other fatigue 12/29/2015   Primary osteoarthritis of both hands 12/29/2015   Primary osteoarthritis of both feet 12/29/2015   DJD (degenerative joint disease), cervical 12/29/2015   DDD (degenerative disc disease), lumbar 12/29/2015   Osteopenia of multiple sites 12/29/2015   History of migraine 12/29/2015    Past Medical History:  Diagnosis Date   Asthma    DDD (degenerative disc disease), cervical    DDD (degenerative disc disease), lumbar    Fatigue    Fibromyalgia    Infertility, female    Osteoarthritis     Family History  Problem Relation Age of Onset   Fibromyalgia Mother    Kidney failure Mother    Mental illness Mother    Migraines Mother    Heart disease Father    COPD Father    Cancer Father        colon, liver     Migraines Paternal Aunt    Hypothyroidism Son    Past Surgical History:  Procedure Laterality Date   CESAREAN SECTION     FOOT SURGERY     GALLBLADDER SURGERY     KNEE SURGERY     Social History   Social History Narrative   Not on file   Immunization History  Administered Date(s) Administered   Influenza, High Dose Seasonal PF 03/22/2019, 10/25/2019, 11/11/2020   Influenza-Unspecified 12/10/2016   Moderna Sars-Covid-2 Vaccination 03/09/2019, 03/22/2019, 04/06/2019, 01/20/2020   Pneumococcal Polysaccharide-23 10/25/2019, 11/05/2020   Zoster Recombinant(Shingrix) 08/31/2017, 11/29/2017     Objective: Vital Signs: There were no vitals taken for this visit.   Physical Exam   Musculoskeletal Exam: ***  CDAI Exam: CDAI Score: -- Patient Global: --; Provider Global: -- Swollen: --; Tender: -- Joint Exam 12/22/2022   No joint exam has been documented for this visit   There is currently no information documented on the homunculus. Go to the Rheumatology activity and complete the homunculus joint exam.  Investigation: No additional findings.  Imaging: No results found.  Recent Labs: Lab Results  Component Value Date   WBC 6.6 02/21/2017   HGB 12.9 02/21/2017   PLT 327 02/21/2017   NA 137 02/21/2017   K 4.3 02/21/2017   CL 102 02/21/2017   CO2 29 02/21/2017   GLUCOSE 88 02/21/2017   BUN 16 02/21/2017   CREATININE 0.58 02/21/2017   BILITOT 0.3 02/21/2017   ALKPHOS 28 (L) 12/30/2015   AST  22 02/21/2017   ALT 20 02/21/2017   PROT 6.8 02/21/2017   ALBUMIN 4.4 12/30/2015   CALCIUM 9.4 02/21/2017   GFRAA 112 02/21/2017    Speciality Comments: No specialty comments available.  Procedures:  No procedures performed Allergies: Hydrocodone, Codeine, Other, Sulfa antibiotics, Cephalosporins, and Sulfamethoxazole   Assessment / Plan:     Visit Diagnoses: No diagnosis found.  Orders: No orders of the defined types were placed in this encounter.  No orders of  the defined types were placed in this encounter.   Face-to-face time spent with patient was *** minutes. Greater than 50% of time was spent in counseling and coordination of care.  Follow-Up Instructions: No follow-ups on file.   Ellen Henri, CMA  Note - This record has been created using Animal nutritionist.  Chart creation errors have been sought, but may not always  have been located. Such creation errors do not reflect on  the standard of medical care.

## 2022-12-20 ENCOUNTER — Other Ambulatory Visit: Payer: Self-pay | Admitting: Neurology

## 2022-12-20 ENCOUNTER — Other Ambulatory Visit (HOSPITAL_COMMUNITY): Payer: Self-pay

## 2022-12-20 MED FILL — Famotidine Tab 20 MG: ORAL | 30 days supply | Qty: 30 | Fill #1 | Status: AC

## 2022-12-21 ENCOUNTER — Other Ambulatory Visit: Payer: Self-pay

## 2022-12-21 ENCOUNTER — Other Ambulatory Visit (HOSPITAL_COMMUNITY): Payer: Self-pay

## 2022-12-21 MED ORDER — SUMATRIPTAN SUCCINATE 100 MG PO TABS
ORAL_TABLET | ORAL | 11 refills | Status: DC
  Filled 2022-12-21: qty 9, 30d supply, fill #0
  Filled 2023-07-08: qty 9, 30d supply, fill #1
  Filled 2023-08-24: qty 9, 30d supply, fill #2

## 2022-12-22 ENCOUNTER — Ambulatory Visit: Payer: Medicare Other | Admitting: Rheumatology

## 2022-12-22 ENCOUNTER — Other Ambulatory Visit: Payer: Self-pay

## 2022-12-22 ENCOUNTER — Other Ambulatory Visit (HOSPITAL_COMMUNITY): Payer: Self-pay

## 2022-12-22 DIAGNOSIS — M19041 Primary osteoarthritis, right hand: Secondary | ICD-10-CM

## 2022-12-22 DIAGNOSIS — R5383 Other fatigue: Secondary | ICD-10-CM

## 2022-12-22 DIAGNOSIS — M47816 Spondylosis without myelopathy or radiculopathy, lumbar region: Secondary | ICD-10-CM

## 2022-12-22 DIAGNOSIS — S322XXS Fracture of coccyx, sequela: Secondary | ICD-10-CM

## 2022-12-22 DIAGNOSIS — M5030 Other cervical disc degeneration, unspecified cervical region: Secondary | ICD-10-CM

## 2022-12-22 DIAGNOSIS — M8589 Other specified disorders of bone density and structure, multiple sites: Secondary | ICD-10-CM

## 2022-12-22 DIAGNOSIS — M19071 Primary osteoarthritis, right ankle and foot: Secondary | ICD-10-CM

## 2022-12-22 DIAGNOSIS — M797 Fibromyalgia: Secondary | ICD-10-CM

## 2022-12-22 DIAGNOSIS — Z8639 Personal history of other endocrine, nutritional and metabolic disease: Secondary | ICD-10-CM

## 2022-12-28 ENCOUNTER — Telehealth: Payer: Self-pay | Admitting: Neurology

## 2022-12-28 NOTE — Telephone Encounter (Signed)
LVM and sent mychart msg informing pt of appt change due to Dr. Lucia Gaskins being out

## 2023-01-03 ENCOUNTER — Other Ambulatory Visit: Payer: Self-pay | Admitting: Psychiatry

## 2023-01-03 ENCOUNTER — Other Ambulatory Visit: Payer: Self-pay

## 2023-01-03 ENCOUNTER — Other Ambulatory Visit (HOSPITAL_COMMUNITY): Payer: Self-pay

## 2023-01-03 DIAGNOSIS — F411 Generalized anxiety disorder: Secondary | ICD-10-CM

## 2023-01-03 MED ORDER — ALPRAZOLAM 0.5 MG PO TABS
ORAL_TABLET | ORAL | 2 refills | Status: DC
  Filled 2023-01-03: qty 75, 30d supply, fill #0

## 2023-01-03 NOTE — Telephone Encounter (Signed)
Lf 10/10 lv 09/

## 2023-01-04 ENCOUNTER — Other Ambulatory Visit: Payer: Self-pay

## 2023-01-13 ENCOUNTER — Ambulatory Visit: Payer: Medicare Other | Admitting: Neurology

## 2023-01-13 ENCOUNTER — Other Ambulatory Visit: Payer: Self-pay

## 2023-01-13 ENCOUNTER — Other Ambulatory Visit (HOSPITAL_COMMUNITY): Payer: Self-pay

## 2023-01-13 DIAGNOSIS — G43709 Chronic migraine without aura, not intractable, without status migrainosus: Secondary | ICD-10-CM | POA: Diagnosis not present

## 2023-01-13 MED ORDER — ONABOTULINUMTOXINA 200 UNITS IJ SOLR
155.0000 [IU] | Freq: Once | INTRAMUSCULAR | Status: AC
  Administered 2023-01-13: 155 [IU] via INTRAMUSCULAR

## 2023-01-13 NOTE — Progress Notes (Signed)
 Consent Form Botulism Toxin Injection For Chronic Migraine  01/13/2023: stable doing great 10/19/2022: stable, > 50% improvement in migraine freq and severity 07/27/22: she has cts and thumb bone on bone sees gramig. Getting gaston treatment for her right muscle tightness, sam(antha) as [facility], stable still doing great on botoc 05/04/2022: stable  04/10/2022: Stable, doing well 11/11/2021 Transfer of care from Dr. Chalice. she had 25 migraine days a month without aura(rarely will she have an aura), moderately to severe, would last 24-72 hours untreated, photophobia/phonophobia, smells would trigger, light was the worst, botox  has significantly improved her life, changed her life, she has been having botox  for years and continues to improve quality of life, no medication overuse, she only gets 4 migraines a month and < 10 total headache days a month now with botox .  Gave her info on [place] .   Reviewed orally with patient, additionally signature is on file:  Botulism toxin has been approved by the Federal drug administration for treatment of chronic migraine. Botulism toxin does not cure chronic migraine and it may not be effective in some patients.  The administration of botulism toxin is accomplished by injecting a small amount of toxin into the muscles of the neck and head. Dosage must be titrated for each individual. Any benefits resulting from botulism toxin tend to wear off after 3 months with a repeat injection required if benefit is to be maintained. Injections are usually done every 3-4 months with maximum effect peak achieved by about 2 or 3 weeks. Botulism toxin is expensive and you should be sure of what costs you will incur resulting from the injection.  The side effects of botulism toxin use for chronic migraine may include:   -Transient, and usually mild, facial weakness with facial injections  -Transient, and usually mild, head or neck weakness with head/neck  injections  -Reduction or loss of forehead facial animation due to forehead muscle weakness  -Eyelid drooping  -Dry eye  -Pain at the site of injection or bruising at the site of injection  -Double vision  -Potential unknown long term risks  Contraindications: You should not have Botox  if you are pregnant, nursing, allergic to albumin, have an infection, skin condition, or muscle weakness at the site of the injection, or have myasthenia gravis, Lambert-Eaton syndrome, or ALS.  It is also possible that as with any injection, there may be an allergic reaction or no effect from the medication. Reduced effectiveness after repeated injections is sometimes seen and rarely infection at the injection site may occur. All care will be taken to prevent these side effects. If therapy is given over a long time, atrophy and wasting in the muscle injected may occur. Occasionally the patient's become refractory to treatment because they develop antibodies to the toxin. In this event, therapy needs to be modified.  I have read the above information and consent to the administration of botulism toxin.    BOTOX  PROCEDURE NOTE FOR MIGRAINE HEADACHE    Contraindications and precautions discussed with patient(above). Aseptic procedure was observed and patient tolerated procedure. Procedure performed by Dr. Andree Epp  The condition has existed for more than 6 months, and pt does not have a diagnosis of ALS, Myasthenia Gravis or Lambert-Eaton Syndrome.  Risks and benefits of injections discussed and pt agrees to proceed with the procedure.  Written consent obtained  These injections are medically necessary. Pt  receives good benefits from these injections. These injections do not cause sedations or hallucinations which the oral therapies  may cause.  Description of procedure:  The patient was placed in a sitting position. The standard protocol was used for Botox  as follows, with 5 units of Botox  injected at each  site:   -Procerus muscle, midline injection  -Corrugator muscle, bilateral injection  -Frontalis muscle, bilateral injection, with 2 sites each side, medial injection was performed in the upper one third of the frontalis muscle, in the region vertical from the medial inferior edge of the superior orbital rim. The lateral injection was again in the upper one third of the forehead vertically above the lateral limbus of the cornea, 1.5 cm lateral to the medial injection site.  -Temporalis muscle injection, 4 sites, bilaterally. The first injection was 3 cm above the tragus of the ear, second injection site was 1.5 cm to 3 cm up from the first injection site in line with the tragus of the ear. The third injection site was 1.5-3 cm forward between the first 2 injection sites. The fourth injection site was 1.5 cm posterior to the second injection site.   -Occipitalis muscle injection, 3 sites, bilaterally. The first injection was done one half way between the occipital protuberance and the tip of the mastoid process behind the ear. The second injection site was done lateral and superior to the first, 1 fingerbreadth from the first injection. The third injection site was 1 fingerbreadth superiorly and medially from the first injection site.  -Cervical paraspinal muscle injection, 2 sites, bilateral knee first injection site was 1 cm from the midline of the cervical spine, 3 cm inferior to the lower border of the occipital protuberance. The second injection site was 1.5 cm superiorly and laterally to the first injection site.  -Trapezius muscle injection was performed at 3 sites, bilaterally. The first injection site was in the upper trapezius muscle halfway between the inflection point of the neck, and the acromion. The second injection site was one half way between the acromion and the first injection site. The third injection was done between the first injection site and the inflection point of the  neck.   Will return for repeat injection in 3 months.   200 units of Botox  was used, 45U Botox  not injected was wasted. The patient tolerated the procedure well, there were no complications of the above procedure.

## 2023-01-13 NOTE — Progress Notes (Signed)
 Botox- 200 units x 1 vial Lot: [id number] Expiration: 03/2025 NDC: 1027-2536-64  Bacteriostatic 0.9% Sodium Chloride- 4 mL  Lot: [id number] Expiration: 04/12/2023 NDC: 2595-6387-56  Dx: E33.295 B/B Witnessed by Leeann Must RN

## 2023-01-18 ENCOUNTER — Other Ambulatory Visit (HOSPITAL_COMMUNITY): Payer: Self-pay

## 2023-01-21 ENCOUNTER — Other Ambulatory Visit (HOSPITAL_COMMUNITY): Payer: Self-pay

## 2023-01-21 ENCOUNTER — Other Ambulatory Visit: Payer: Self-pay

## 2023-01-24 ENCOUNTER — Other Ambulatory Visit (HOSPITAL_COMMUNITY): Payer: Self-pay

## 2023-01-25 ENCOUNTER — Telehealth: Payer: Self-pay

## 2023-01-25 NOTE — Telephone Encounter (Signed)
 Prior Authorization submitted and approved for Modafinil 100 mg take 1.5 tablets daily, [place] Case: [id number], Status: Approved, Coverage Starts on: 01/12/2023 , Coverage Ends on: 01/11/2024

## 2023-01-27 DIAGNOSIS — Z681 Body mass index (BMI) 19 or less, adult: Secondary | ICD-10-CM | POA: Diagnosis not present

## 2023-01-27 DIAGNOSIS — Z124 Encounter for screening for malignant neoplasm of cervix: Secondary | ICD-10-CM | POA: Diagnosis not present

## 2023-01-27 DIAGNOSIS — R87616 Satisfactory cervical smear but lacking transformation zone: Secondary | ICD-10-CM | POA: Diagnosis not present

## 2023-01-27 DIAGNOSIS — Z1151 Encounter for screening for human papillomavirus (HPV): Secondary | ICD-10-CM | POA: Diagnosis not present

## 2023-01-27 DIAGNOSIS — Z1231 Encounter for screening mammogram for malignant neoplasm of breast: Secondary | ICD-10-CM | POA: Diagnosis not present

## 2023-01-31 ENCOUNTER — Other Ambulatory Visit (HOSPITAL_COMMUNITY): Payer: Self-pay

## 2023-01-31 ENCOUNTER — Other Ambulatory Visit: Payer: Self-pay

## 2023-02-01 ENCOUNTER — Ambulatory Visit: Payer: Medicare Other | Admitting: Rheumatology

## 2023-02-13 NOTE — Progress Notes (Deleted)
 Office Visit Note  Patient: Lauren Bradley             Date of Birth: 1950/03/07           MRN: [id number]             PCP: Pcp, No Referring: No ref. provider found Visit Date: 02/23/2023 Occupation: @GUAROCC @  Subjective:  No chief complaint on file.   History of Present Illness: ADENA SIMA is a 73 y.o. female ***     Activities of Daily Living:  Patient reports morning stiffness for *** {minute/hour:19697}.   Patient {ACTIONS;DENIES/REPORTS:21021675::"Denies"} nocturnal pain.  Difficulty dressing/grooming: {ACTIONS;DENIES/REPORTS:21021675::"Denies"} Difficulty climbing stairs: {ACTIONS;DENIES/REPORTS:21021675::"Denies"} Difficulty getting out of chair: {ACTIONS;DENIES/REPORTS:21021675::"Denies"} Difficulty using hands for taps, buttons, cutlery, and/or writing: {ACTIONS;DENIES/REPORTS:21021675::"Denies"}  No Rheumatology ROS completed.   PMFS History:  Patient Active Problem List   Diagnosis Date Noted   Upper airway cough syndrome 01/15/2021   Major depressive disorder, single episode, severe (HCC) 10/06/2017   History of vitamin D deficiency 10/12/2016   History of seasonal allergies 12/29/2015   Fibromyalgia 12/29/2015   Other fatigue 12/29/2015   Primary osteoarthritis of both hands 12/29/2015   Primary osteoarthritis of both feet 12/29/2015   DJD (degenerative joint disease), cervical 12/29/2015   DDD (degenerative disc disease), lumbar 12/29/2015   Osteopenia of multiple sites 12/29/2015   History of migraine 12/29/2015    Past Medical History:  Diagnosis Date   Asthma    DDD (degenerative disc disease), cervical    DDD (degenerative disc disease), lumbar    Fatigue    Fibromyalgia    Infertility, female    Osteoarthritis     Family History  Problem Relation Age of Onset   Fibromyalgia Mother    Kidney failure Mother    Mental illness Mother    Migraines Mother    Heart disease Father    COPD Father    Cancer Father        colon, liver     Migraines Paternal Aunt    Hypothyroidism Son    Past Surgical History:  Procedure Laterality Date   CESAREAN SECTION     FOOT SURGERY     GALLBLADDER SURGERY     KNEE SURGERY     Social History   Social History Narrative   Not on file   Immunization History  Administered Date(s) Administered   Influenza, High Dose Seasonal PF 03/22/2019, 10/25/2019, 11/11/2020   Influenza-Unspecified 12/10/2016   Moderna Sars-Covid-2 Vaccination 03/09/2019, 03/22/2019, 04/06/2019, 01/20/2020   Pneumococcal Polysaccharide-23 10/25/2019, 11/05/2020   Zoster Recombinant(Shingrix) 08/31/2017, 11/29/2017     Objective: Vital Signs: There were no vitals taken for this visit.   Physical Exam   Musculoskeletal Exam: ***  CDAI Exam: CDAI Score: -- Patient Global: --; Provider Global: -- Swollen: --; Tender: -- Joint Exam 02/23/2023   No joint exam has been documented for this visit   There is currently no information documented on the homunculus. Go to the Rheumatology activity and complete the homunculus joint exam.  Investigation: No additional findings.  Imaging: No results found.  Recent Labs: Lab Results  Component Value Date   WBC 6.6 02/21/2017   HGB 12.9 02/21/2017   PLT 327 02/21/2017   NA 137 02/21/2017   K 4.3 02/21/2017   CL 102 02/21/2017   CO2 29 02/21/2017   GLUCOSE 88 02/21/2017   BUN 16 02/21/2017   CREATININE 0.58 02/21/2017   BILITOT 0.3 02/21/2017   ALKPHOS 28 (L) 12/30/2015  AST 22 02/21/2017   ALT 20 02/21/2017   PROT 6.8 02/21/2017   ALBUMIN 4.4 12/30/2015   CALCIUM 9.4 02/21/2017   GFRAA 112 02/21/2017    Speciality Comments: No specialty comments available.  Procedures:  No procedures performed Allergies: Hydrocodone, Codeine, Other, Sulfa antibiotics, Cephalosporins, and Sulfamethoxazole   Assessment / Plan:     Visit Diagnoses: No diagnosis found.  Orders: No orders of the defined types were placed in this encounter.  No orders  of the defined types were placed in this encounter.   Face-to-face time spent with patient was *** minutes. Greater than 50% of time was spent in counseling and coordination of care.  Follow-Up Instructions: No follow-ups on file.   Pollyann Savoy, MD  Note - This record has been created using Animal nutritionist.  Chart creation errors have been sought, but may not always  have been located. Such creation errors do not reflect on  the standard of medical care.

## 2023-02-22 ENCOUNTER — Ambulatory Visit: Payer: Medicare Other | Admitting: Psychiatry

## 2023-02-23 ENCOUNTER — Other Ambulatory Visit: Payer: Self-pay

## 2023-02-23 ENCOUNTER — Other Ambulatory Visit (HOSPITAL_COMMUNITY): Payer: Self-pay

## 2023-02-23 ENCOUNTER — Ambulatory Visit: Payer: Medicare Other | Admitting: Rheumatology

## 2023-02-23 DIAGNOSIS — M797 Fibromyalgia: Secondary | ICD-10-CM

## 2023-02-23 DIAGNOSIS — M19071 Primary osteoarthritis, right ankle and foot: Secondary | ICD-10-CM

## 2023-02-23 DIAGNOSIS — R5383 Other fatigue: Secondary | ICD-10-CM

## 2023-02-23 DIAGNOSIS — M8589 Other specified disorders of bone density and structure, multiple sites: Secondary | ICD-10-CM

## 2023-02-23 DIAGNOSIS — M5030 Other cervical disc degeneration, unspecified cervical region: Secondary | ICD-10-CM

## 2023-02-23 DIAGNOSIS — Z8639 Personal history of other endocrine, nutritional and metabolic disease: Secondary | ICD-10-CM

## 2023-02-23 DIAGNOSIS — S322XXS Fracture of coccyx, sequela: Secondary | ICD-10-CM

## 2023-02-23 DIAGNOSIS — M47816 Spondylosis without myelopathy or radiculopathy, lumbar region: Secondary | ICD-10-CM

## 2023-02-23 DIAGNOSIS — M19042 Primary osteoarthritis, left hand: Secondary | ICD-10-CM

## 2023-02-28 ENCOUNTER — Other Ambulatory Visit (HOSPITAL_COMMUNITY): Payer: Self-pay

## 2023-02-28 ENCOUNTER — Other Ambulatory Visit: Payer: Self-pay

## 2023-02-28 DIAGNOSIS — R053 Chronic cough: Secondary | ICD-10-CM | POA: Diagnosis not present

## 2023-02-28 MED ORDER — FAMOTIDINE 40 MG PO TABS
40.0000 mg | ORAL_TABLET | Freq: Two times a day (BID) | ORAL | 3 refills | Status: AC
  Filled 2023-02-28: qty 180, 90d supply, fill #0
  Filled 2023-05-30 (×2): qty 180, 90d supply, fill #1
  Filled 2023-08-24: qty 180, 90d supply, fill #2
  Filled 2023-12-12: qty 180, 90d supply, fill #3

## 2023-03-01 NOTE — Progress Notes (Signed)
 Office Visit Note  Patient: Lauren Bradley             Date of Birth: 08-05-1950           MRN: [id number]             PCP: Pcp, No Referring: No ref. provider found Visit Date: 03/15/2023 Occupation: @GUAROCC @  Subjective:  Pain in multiple joints  History of Present Illness: Lauren Bradley is a 73 y.o. female with osteoarthritis and degenerative disc disease.  She returns today after last visit in June 2023.  She states she continues to have pain and discomfort in the bilateral hands and her feet.  She also has neck and lower back pain.  She states her balance has been poor.  She feels like she will fall easily.  She has fallen in the past.  She has been using a cushion while she rides her bike and also sleeps which has helped the coccygeal discomfort.      Activities of Daily Living:  Patient reports morning stiffness for all day.   Patient Reports nocturnal pain.  Difficulty dressing/grooming: Denies Difficulty climbing stairs: Reports Difficulty getting out of chair: Denies Difficulty using hands for taps, buttons, cutlery, and/or writing: Reports  Review of Systems  Constitutional:  Positive for fatigue.  HENT:  Positive for mouth dryness. Negative for mouth sores.   Eyes:  Negative for dryness.  Respiratory:  Positive for shortness of breath.   Cardiovascular:  Negative for chest pain and palpitations.  Gastrointestinal:  Negative for blood in stool, constipation and diarrhea.  Endocrine: Positive for increased urination.  Genitourinary:  Negative for involuntary urination.  Musculoskeletal:  Positive for joint pain, joint pain, myalgias, muscle weakness, morning stiffness, muscle tenderness and myalgias. Negative for gait problem and joint swelling.  Skin:  Positive for rash. Negative for color change, hair loss and sensitivity to sunlight.  Allergic/Immunologic: Negative for susceptible to infections.  Neurological:  Negative for dizziness and headaches.   Hematological:  Negative for swollen glands.  Psychiatric/Behavioral:  Negative for depressed mood and sleep disturbance. The patient is not nervous/anxious.     PMFS History:  Patient Active Problem List   Diagnosis Date Noted   Upper airway cough syndrome 01/15/2021   Major depressive disorder, single episode, severe (HCC) 10/06/2017   History of vitamin D deficiency 10/12/2016   History of seasonal allergies 12/29/2015   Fibromyalgia 12/29/2015   Other fatigue 12/29/2015   Primary osteoarthritis of both hands 12/29/2015   Primary osteoarthritis of both feet 12/29/2015   DJD (degenerative joint disease), cervical 12/29/2015   DDD (degenerative disc disease), lumbar 12/29/2015   Osteopenia of multiple sites 12/29/2015   History of migraine 12/29/2015    Past Medical History:  Diagnosis Date   Asthma    DDD (degenerative disc disease), cervical    DDD (degenerative disc disease), lumbar    Fatigue    Fibromyalgia    Infertility, female    Osteoarthritis     Family History  Problem Relation Age of Onset   Fibromyalgia Mother    Kidney failure Mother    Mental illness Mother    Migraines Mother    Heart disease Father    COPD Father    Cancer Father        colon, liver    Migraines Paternal Aunt    Hypothyroidism Son    Past Surgical History:  Procedure Laterality Date   CESAREAN SECTION     FOOT SURGERY  GALLBLADDER SURGERY     KNEE SURGERY     Social History   Social History Narrative   Not on file   Immunization History  Administered Date(s) Administered   Influenza, High Dose Seasonal PF 03/22/2019, 10/25/2019, 11/11/2020   Influenza-Unspecified 12/10/2016   Moderna Sars-Covid-2 Vaccination 03/09/2019, 03/22/2019, 04/06/2019, 01/20/2020   Pneumococcal Polysaccharide-23 10/25/2019, 11/05/2020   Zoster Recombinant(Shingrix) 08/31/2017, 11/29/2017     Objective: Vital Signs: BP 137/80 (BP Location: Left Arm, Patient Position: Sitting, Cuff Size:  Normal)   Pulse 82   Resp 15   Ht 5\' 2"  (1.575 m)   Wt 102 lb 12.8 oz (46.6 kg)   BMI 18.80 kg/m    Physical Exam Vitals and nursing note reviewed.  Constitutional:      Appearance: She is well-developed.  HENT:     Head: Normocephalic and atraumatic.  Eyes:     Conjunctiva/sclera: Conjunctivae normal.  Cardiovascular:     Rate and Rhythm: Normal rate and regular rhythm.     Heart sounds: Normal heart sounds.  Pulmonary:     Effort: Pulmonary effort is normal.     Breath sounds: Normal breath sounds.  Abdominal:     General: Bowel sounds are normal.     Palpations: Abdomen is soft.  Musculoskeletal:     Cervical back: Normal range of motion.  Lymphadenopathy:     Cervical: No cervical adenopathy.  Skin:    General: Skin is warm and dry.     Capillary Refill: Capillary refill takes less than 2 seconds.  Neurological:     Mental Status: She is alert and oriented to person, place, and time.  Psychiatric:        Behavior: Behavior normal.      Musculoskeletal Exam: She had limited range of motion of the cervical spine.  Thoracic and lumbar spine were in good range of motion without any tenderness.  Thoracic and lumbar spine were in good range of motion without any tenderness.  Shoulder joints, elbow joints, wrist joints in good range of motion.  She had bilateral CMC PIP and DIP thickening with no synovitis.  Hip joints and knee joints in good range of motion without any warmth swelling or effusion.  There was no tenderness over ankles or MTPs.  CDAI Exam: CDAI Score: -- Patient Global: --; Provider Global: -- Swollen: --; Tender: -- Joint Exam 03/15/2023   No joint exam has been documented for this visit   There is currently no information documented on the homunculus. Go to the Rheumatology activity and complete the homunculus joint exam.  Investigation: No additional findings.  Imaging: No results found.  Recent Labs: Lab Results  Component Value Date   WBC  6.6 02/21/2017   HGB 12.9 02/21/2017   PLT 327 02/21/2017   NA 137 02/21/2017   K 4.3 02/21/2017   CL 102 02/21/2017   CO2 29 02/21/2017   GLUCOSE 88 02/21/2017   BUN 16 02/21/2017   CREATININE 0.58 02/21/2017   BILITOT 0.3 02/21/2017   ALKPHOS 28 (L) 12/30/2015   AST 22 02/21/2017   ALT 20 02/21/2017   PROT 6.8 02/21/2017   ALBUMIN 4.4 12/30/2015   CALCIUM 9.4 02/21/2017   GFRAA 112 02/21/2017    Speciality Comments: No specialty comments available.  Procedures:  No procedures performed Allergies: Hydrocodone, Codeine, Other, Sulfa antibiotics, Thimerosal (thiomersal), Cephalosporins, and Sulfamethoxazole   Assessment / Plan:     Visit Diagnoses: Primary osteoarthritis of both hands-she continues to have pain and discomfort  in her bilateral hands.  Her left third PIP joint is most painful.  She has been using a ring splint.  She tried Celebrex in the past but could not tolerate due to increase in depression.  Joint protection muscle strengthening was discussed.  I will refer her to physical therapy.  Primary osteoarthritis of both feet-she states the discomfort in her feet has been better since she has been wearing [place] shoes.  DDD (degenerative disc disease), cervical-she continues to have neck pain and stiffness.  She has discomfort with range of motion of her cervical spine.  I will refer to physical therapy.  Spondylosis of lumbar spine-chronic pain.  Closed fracture of coccyx, sequela-doing better since she has been using a coccyx cushion.  Fibromyalgia-she continues to have generalized pain and discomfort from fibromyalgia.  Need for regular exercise and stretching was discussed.  Patient states she recently lost her mother and was sitting in the hospital for hours.  She has lost her muscle strength and tone.  She recently started exercising.  Other fatigue-related to fibromyalgia.  Poor balance-patient states she has poor balance.  She had fallen in the past easily.   We had a detailed discussion regarding physical therapy.  I will refer her to physical therapy for balance training.  Age-related osteoporosis without current pathological fracture - Her bone density is followed by her GYN.  History of vitamin D deficiency-she takes vitamin D supplement.  Grieving-patient lost her mother recently and has been going through the grieving process.  Orders: No orders of the defined types were placed in this encounter.  No orders of the defined types were placed in this encounter.   Follow-Up Instructions: Return in about 1 year (around 03/14/2024) for Osteoarthritis, Osteoporosis.   Pollyann Savoy, MD  Note - This record has been created using Animal nutritionist.  Chart creation errors have been sought, but may not always  have been located. Such creation errors do not reflect on  the standard of medical care.

## 2023-03-03 ENCOUNTER — Other Ambulatory Visit (HOSPITAL_COMMUNITY): Payer: Self-pay

## 2023-03-14 ENCOUNTER — Other Ambulatory Visit (HOSPITAL_COMMUNITY): Payer: Self-pay

## 2023-03-14 ENCOUNTER — Other Ambulatory Visit: Payer: Self-pay | Admitting: Psychiatry

## 2023-03-14 ENCOUNTER — Other Ambulatory Visit: Payer: Self-pay

## 2023-03-14 DIAGNOSIS — F4010 Social phobia, unspecified: Secondary | ICD-10-CM

## 2023-03-14 DIAGNOSIS — F331 Major depressive disorder, recurrent, moderate: Secondary | ICD-10-CM

## 2023-03-14 DIAGNOSIS — F411 Generalized anxiety disorder: Secondary | ICD-10-CM

## 2023-03-14 MED ORDER — PROGESTERONE 200 MG PO CAPS
200.0000 mg | ORAL_CAPSULE | Freq: Every day | ORAL | 3 refills | Status: AC
  Filled 2023-03-14: qty 90, 90d supply, fill #0
  Filled 2023-07-08: qty 90, 90d supply, fill #1
  Filled 2023-10-04: qty 90, 90d supply, fill #2
  Filled 2024-01-02: qty 90, 90d supply, fill #3

## 2023-03-14 MED ORDER — ARIPIPRAZOLE 5 MG PO TABS
5.0000 mg | ORAL_TABLET | Freq: Every day | ORAL | 1 refills | Status: DC
  Filled 2023-03-14: qty 90, 90d supply, fill #0
  Filled 2023-06-14: qty 90, 90d supply, fill #1

## 2023-03-14 MED ORDER — DULOXETINE HCL 30 MG PO CPEP
90.0000 mg | ORAL_CAPSULE | Freq: Every day | ORAL | 1 refills | Status: DC
  Filled 2023-03-14: qty 270, 90d supply, fill #0
  Filled 2023-06-24: qty 270, 90d supply, fill #1

## 2023-03-15 ENCOUNTER — Other Ambulatory Visit: Payer: Self-pay

## 2023-03-15 ENCOUNTER — Ambulatory Visit: Payer: Medicare Other | Attending: Rheumatology | Admitting: Rheumatology

## 2023-03-15 ENCOUNTER — Encounter: Payer: Self-pay | Admitting: Rheumatology

## 2023-03-15 VITALS — BP 137/80 | HR 82 | Resp 15 | Ht 62.0 in | Wt 102.8 lb

## 2023-03-15 DIAGNOSIS — R5383 Other fatigue: Secondary | ICD-10-CM | POA: Diagnosis not present

## 2023-03-15 DIAGNOSIS — M47816 Spondylosis without myelopathy or radiculopathy, lumbar region: Secondary | ICD-10-CM | POA: Insufficient documentation

## 2023-03-15 DIAGNOSIS — M5030 Other cervical disc degeneration, unspecified cervical region: Secondary | ICD-10-CM | POA: Insufficient documentation

## 2023-03-15 DIAGNOSIS — S322XXS Fracture of coccyx, sequela: Secondary | ICD-10-CM | POA: Diagnosis not present

## 2023-03-15 DIAGNOSIS — R2689 Other abnormalities of gait and mobility: Secondary | ICD-10-CM | POA: Diagnosis not present

## 2023-03-15 DIAGNOSIS — M797 Fibromyalgia: Secondary | ICD-10-CM | POA: Diagnosis not present

## 2023-03-15 DIAGNOSIS — Z638 Other specified problems related to primary support group: Secondary | ICD-10-CM | POA: Diagnosis not present

## 2023-03-15 DIAGNOSIS — M19071 Primary osteoarthritis, right ankle and foot: Secondary | ICD-10-CM | POA: Insufficient documentation

## 2023-03-15 DIAGNOSIS — M810 Age-related osteoporosis without current pathological fracture: Secondary | ICD-10-CM | POA: Diagnosis not present

## 2023-03-15 DIAGNOSIS — Z8639 Personal history of other endocrine, nutritional and metabolic disease: Secondary | ICD-10-CM | POA: Insufficient documentation

## 2023-03-15 DIAGNOSIS — M8589 Other specified disorders of bone density and structure, multiple sites: Secondary | ICD-10-CM

## 2023-03-15 DIAGNOSIS — M19042 Primary osteoarthritis, left hand: Secondary | ICD-10-CM | POA: Insufficient documentation

## 2023-03-15 DIAGNOSIS — M19041 Primary osteoarthritis, right hand: Secondary | ICD-10-CM | POA: Insufficient documentation

## 2023-03-15 DIAGNOSIS — M19072 Primary osteoarthritis, left ankle and foot: Secondary | ICD-10-CM | POA: Diagnosis not present

## 2023-03-16 ENCOUNTER — Other Ambulatory Visit (HOSPITAL_COMMUNITY): Payer: Self-pay

## 2023-03-18 ENCOUNTER — Other Ambulatory Visit (HOSPITAL_COMMUNITY): Payer: Self-pay

## 2023-03-18 ENCOUNTER — Other Ambulatory Visit: Payer: Self-pay

## 2023-03-18 MED ORDER — THYROID 15 MG PO TABS
ORAL_TABLET | ORAL | 4 refills | Status: AC
  Filled 2023-03-18: qty 90, 90d supply, fill #0
  Filled 2023-06-14: qty 90, 90d supply, fill #1
  Filled 2023-09-09: qty 90, 90d supply, fill #2
  Filled 2023-12-12: qty 90, 90d supply, fill #3

## 2023-03-19 ENCOUNTER — Other Ambulatory Visit (HOSPITAL_BASED_OUTPATIENT_CLINIC_OR_DEPARTMENT_OTHER): Payer: Self-pay

## 2023-03-21 ENCOUNTER — Other Ambulatory Visit (HOSPITAL_COMMUNITY): Payer: Self-pay

## 2023-03-21 ENCOUNTER — Other Ambulatory Visit: Payer: Self-pay

## 2023-03-23 ENCOUNTER — Other Ambulatory Visit: Payer: Self-pay | Admitting: Psychiatry

## 2023-03-23 ENCOUNTER — Encounter: Payer: Self-pay | Admitting: Neurology

## 2023-03-23 ENCOUNTER — Other Ambulatory Visit (HOSPITAL_COMMUNITY): Payer: Self-pay

## 2023-03-23 ENCOUNTER — Other Ambulatory Visit: Payer: Self-pay

## 2023-03-23 DIAGNOSIS — F900 Attention-deficit hyperactivity disorder, predominantly inattentive type: Secondary | ICD-10-CM

## 2023-03-23 DIAGNOSIS — G4710 Hypersomnia, unspecified: Secondary | ICD-10-CM

## 2023-03-23 MED ORDER — UBRELVY 100 MG PO TABS
ORAL_TABLET | ORAL | 11 refills | Status: AC
  Filled 2023-03-23: qty 16, 30d supply, fill #0

## 2023-03-24 ENCOUNTER — Other Ambulatory Visit (HOSPITAL_COMMUNITY): Payer: Self-pay

## 2023-03-24 ENCOUNTER — Encounter: Payer: Self-pay | Admitting: Pharmacist

## 2023-03-24 ENCOUNTER — Other Ambulatory Visit: Payer: Self-pay

## 2023-03-24 MED ORDER — MODAFINIL 100 MG PO TABS
ORAL_TABLET | ORAL | 0 refills | Status: DC
  Filled 2023-03-24: qty 60, 30d supply, fill #0

## 2023-03-25 ENCOUNTER — Other Ambulatory Visit: Payer: Self-pay

## 2023-03-31 ENCOUNTER — Other Ambulatory Visit (HOSPITAL_COMMUNITY): Payer: Self-pay

## 2023-03-31 MED ORDER — ESTRADIOL 0.075 MG/24HR TD PTTW
MEDICATED_PATCH | TRANSDERMAL | 3 refills | Status: AC
  Filled 2023-03-31: qty 24, 90d supply, fill #0
  Filled 2023-06-24: qty 24, 90d supply, fill #1
  Filled 2023-11-17: qty 24, 90d supply, fill #2

## 2023-04-04 ENCOUNTER — Other Ambulatory Visit (HOSPITAL_COMMUNITY): Payer: Self-pay

## 2023-04-04 DIAGNOSIS — M25372 Other instability, left ankle: Secondary | ICD-10-CM | POA: Diagnosis not present

## 2023-04-04 DIAGNOSIS — M7742 Metatarsalgia, left foot: Secondary | ICD-10-CM | POA: Diagnosis not present

## 2023-04-04 DIAGNOSIS — M7741 Metatarsalgia, right foot: Secondary | ICD-10-CM | POA: Diagnosis not present

## 2023-04-05 DIAGNOSIS — S61304A Unspecified open wound of right ring finger with damage to nail, initial encounter: Secondary | ICD-10-CM | POA: Diagnosis not present

## 2023-04-05 DIAGNOSIS — L570 Actinic keratosis: Secondary | ICD-10-CM | POA: Diagnosis not present

## 2023-04-11 ENCOUNTER — Ambulatory Visit (INDEPENDENT_AMBULATORY_CARE_PROVIDER_SITE_OTHER): Payer: Medicare Other | Admitting: Neurology

## 2023-04-11 DIAGNOSIS — G43709 Chronic migraine without aura, not intractable, without status migrainosus: Secondary | ICD-10-CM

## 2023-04-11 MED ORDER — ONABOTULINUMTOXINA 200 UNITS IJ SOLR
155.0000 [IU] | Freq: Once | INTRAMUSCULAR | Status: AC
  Administered 2023-04-11: 155 [IU] via INTRAMUSCULAR

## 2023-04-11 NOTE — Progress Notes (Signed)
 Botox- 200 units x 1 vial Lot: [id number] Expiration: 04/2025 NDC: 2952-8413-24  Bacteriostatic 0.9% Sodium Chloride- * mL  Lot: [id number] Expiration: 11/12/2023 NDC: 2536-6440-34  Dx: V42.595 B/B Witnessed by Leeann Must, RN

## 2023-04-11 NOTE — Progress Notes (Signed)
 Consent Form Botulism Toxin Injection For Chronic Migraine  04/11/2023: stable, > 50% improvement in migraine freq and severity. Patient feels that her migraines cause her jaw to ache in her jaw aching also can cause her migraines to worsen it is a trigger for migraines included 5 units in each masseter to see if that helps with migraine severity. Inject high in the forehead and 1/2(2.5u) at each corrugator instead of 5u.   01/13/2023: stable doing great 10/19/2022: stable, > 50% improvement in migraine freq and severity 07/27/22: she has cts and thumb bone on bone sees gramig. Getting gaston treatment for her right muscle tightness, sam(antha) as [facility], stable still doing great on botoc 05/04/2022: stable  04/10/2022: Stable, doing well 11/11/2021 Transfer of care from Dr. Antonietta Jewel. she had 25 migraine days a month without aura(rarely will she have an aura), moderately to severe, would last 24-72 hours untreated, photophobia/phonophobia, smells would trigger, light was the worst, botox has significantly improved her life, changed her life, she has been having botox for years and continues to improve quality of life, no medication overuse, she only gets 4 migraines a month and < 10 total headache days a month now with botox.  Gave her info on [facility].   Reviewed orally with patient, additionally signature is on file:  Botulism toxin has been approved by the Federal drug administration for treatment of chronic migraine. Botulism toxin does not cure chronic migraine and it may not be effective in some patients.  The administration of botulism toxin is accomplished by injecting a small amount of toxin into the muscles of the neck and head. Dosage must be titrated for each individual. Any benefits resulting from botulism toxin tend to wear off after 3 months with a repeat injection required if benefit is to be maintained. Injections are usually done every 3-4 months with maximum effect peak achieved by  about 2 or 3 weeks. Botulism toxin is expensive and you should be sure of what costs you will incur resulting from the injection.  The side effects of botulism toxin use for chronic migraine may include:   -Transient, and usually mild, facial weakness with facial injections  -Transient, and usually mild, head or neck weakness with head/neck injections  -Reduction or loss of forehead facial animation due to forehead muscle weakness  -Eyelid drooping  -Dry eye  -Pain at the site of injection or bruising at the site of injection  -Double vision  -Potential unknown long term risks  Contraindications: You should not have Botox if you are pregnant, nursing, allergic to albumin, have an infection, skin condition, or muscle weakness at the site of the injection, or have myasthenia gravis, Lambert-Eaton syndrome, or ALS.  It is also possible that as with any injection, there may be an allergic reaction or no effect from the medication. Reduced effectiveness after repeated injections is sometimes seen and rarely infection at the injection site may occur. All care will be taken to prevent these side effects. If therapy is given over a long time, atrophy and wasting in the muscle injected may occur. Occasionally the patient's become refractory to treatment because they develop antibodies to the toxin. In this event, therapy needs to be modified.  I have read the above information and consent to the administration of botulism toxin.    BOTOX PROCEDURE NOTE FOR MIGRAINE HEADACHE    Contraindications and precautions discussed with patient(above). Aseptic procedure was observed and patient tolerated procedure. Procedure performed by Dr. Artemio Aly  The condition  has existed for more than 6 months, and pt does not have a diagnosis of ALS, Myasthenia Gravis or Lambert-Eaton Syndrome.  Risks and benefits of injections discussed and pt agrees to proceed with the procedure.  Written consent obtained  These  injections are medically necessary. Pt  receives good benefits from these injections. These injections do not cause sedations or hallucinations which the oral therapies may cause.  Description of procedure:  The patient was placed in a sitting position. The standard protocol was used for Botox as follows, with 5 units of Botox injected at each site:   -Procerus muscle, midline injection  -Corrugator muscle, bilateral injection  -Frontalis muscle, bilateral injection, with 2 sites each side, medial injection was performed in the upper one third of the frontalis muscle, in the region vertical from the medial inferior edge of the superior orbital rim. The lateral injection was again in the upper one third of the forehead vertically above the lateral limbus of the cornea, 1.5 cm lateral to the medial injection site.  -Temporalis muscle injection, 4 sites, bilaterally. The first injection was 3 cm above the tragus of the ear, second injection site was 1.5 cm to 3 cm up from the first injection site in line with the tragus of the ear. The third injection site was 1.5-3 cm forward between the first 2 injection sites. The fourth injection site was 1.5 cm posterior to the second injection site.   -Occipitalis muscle injection, 3 sites, bilaterally. The first injection was done one half way between the occipital protuberance and the tip of the mastoid process behind the ear. The second injection site was done lateral and superior to the first, 1 fingerbreadth from the first injection. The third injection site was 1 fingerbreadth superiorly and medially from the first injection site.  -Cervical paraspinal muscle injection, 2 sites, bilateral knee first injection site was 1 cm from the midline of the cervical spine, 3 cm inferior to the lower border of the occipital protuberance. The second injection site was 1.5 cm superiorly and laterally to the first injection site.  -Trapezius muscle injection was  performed at 3 sites, bilaterally. The first injection site was in the upper trapezius muscle halfway between the inflection point of the neck, and the acromion. The second injection site was one half way between the acromion and the first injection site. The third injection was done between the first injection site and the inflection point of the neck.   Will return for repeat injection in 3 months.   200 units of Botox was used, 45U Botox not injected was wasted. The patient tolerated the procedure well, there were no complications of the above procedure.

## 2023-04-21 ENCOUNTER — Encounter: Payer: Self-pay | Admitting: Psychiatry

## 2023-04-21 ENCOUNTER — Ambulatory Visit: Payer: Medicare Other | Admitting: Psychiatry

## 2023-04-21 ENCOUNTER — Other Ambulatory Visit (HOSPITAL_COMMUNITY): Payer: Self-pay

## 2023-04-21 ENCOUNTER — Other Ambulatory Visit: Payer: Self-pay

## 2023-04-21 DIAGNOSIS — G4710 Hypersomnia, unspecified: Secondary | ICD-10-CM

## 2023-04-21 DIAGNOSIS — F5105 Insomnia due to other mental disorder: Secondary | ICD-10-CM | POA: Diagnosis not present

## 2023-04-21 DIAGNOSIS — F900 Attention-deficit hyperactivity disorder, predominantly inattentive type: Secondary | ICD-10-CM | POA: Diagnosis not present

## 2023-04-21 DIAGNOSIS — F331 Major depressive disorder, recurrent, moderate: Secondary | ICD-10-CM | POA: Diagnosis not present

## 2023-04-21 DIAGNOSIS — F4010 Social phobia, unspecified: Secondary | ICD-10-CM | POA: Diagnosis not present

## 2023-04-21 DIAGNOSIS — F411 Generalized anxiety disorder: Secondary | ICD-10-CM

## 2023-04-21 MED ORDER — ALPRAZOLAM 0.25 MG PO TABS
ORAL_TABLET | ORAL | 2 refills | Status: AC
  Filled 2023-04-21: qty 90, 30d supply, fill #0
  Filled 2023-07-08: qty 90, 30d supply, fill #1
  Filled 2023-08-24 (×2): qty 90, 30d supply, fill #2

## 2023-04-21 MED ORDER — MODAFINIL 100 MG PO TABS
ORAL_TABLET | ORAL | 5 refills | Status: DC
  Filled 2023-04-21: qty 60, 30d supply, fill #0
  Filled 2023-05-30 (×2): qty 60, 30d supply, fill #1
  Filled 2023-06-27: qty 60, 30d supply, fill #2
  Filled 2023-07-29: qty 60, 30d supply, fill #3
  Filled 2023-08-24 (×2): qty 60, 30d supply, fill #4
  Filled 2023-10-04: qty 60, 30d supply, fill #5
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
## 2023-04-25 DIAGNOSIS — H43813 Vitreous degeneration, bilateral: Secondary | ICD-10-CM | POA: Diagnosis not present

## 2023-04-29 ENCOUNTER — Other Ambulatory Visit (HOSPITAL_COMMUNITY): Payer: Self-pay

## 2023-04-29 ENCOUNTER — Other Ambulatory Visit: Payer: Self-pay

## 2023-05-03 DIAGNOSIS — M25561 Pain in right knee: Secondary | ICD-10-CM | POA: Diagnosis not present

## 2023-05-03 DIAGNOSIS — M25562 Pain in left knee: Secondary | ICD-10-CM | POA: Diagnosis not present

## 2023-05-06 ENCOUNTER — Other Ambulatory Visit (HOSPITAL_COMMUNITY): Payer: Self-pay

## 2023-05-06 MED ORDER — TRAMADOL HCL 50 MG PO TABS
50.0000 mg | ORAL_TABLET | Freq: Two times a day (BID) | ORAL | 5 refills | Status: DC | PRN
  Filled 2023-05-06: qty 60, 30d supply, fill #0
  Filled 2023-08-24: qty 60, 30d supply, fill #1

## 2023-05-09 ENCOUNTER — Other Ambulatory Visit: Payer: Self-pay

## 2023-05-19 ENCOUNTER — Other Ambulatory Visit: Payer: Self-pay

## 2023-05-19 ENCOUNTER — Other Ambulatory Visit (HOSPITAL_COMMUNITY): Payer: Self-pay

## 2023-05-19 DIAGNOSIS — J301 Allergic rhinitis due to pollen: Secondary | ICD-10-CM | POA: Diagnosis not present

## 2023-05-19 DIAGNOSIS — J4520 Mild intermittent asthma, uncomplicated: Secondary | ICD-10-CM | POA: Diagnosis not present

## 2023-05-19 DIAGNOSIS — R059 Cough, unspecified: Secondary | ICD-10-CM | POA: Diagnosis not present

## 2023-05-19 MED ORDER — ALBUTEROL SULFATE HFA 108 (90 BASE) MCG/ACT IN AERS
2.0000 | INHALATION_SPRAY | Freq: Four times a day (QID) | RESPIRATORY_TRACT | 3 refills | Status: DC | PRN
Start: 2023-05-19 — End: 2023-11-28
  Filled 2023-05-19: qty 18, 25d supply, fill #0

## 2023-05-30 ENCOUNTER — Other Ambulatory Visit: Payer: Self-pay

## 2023-05-30 ENCOUNTER — Other Ambulatory Visit (HOSPITAL_COMMUNITY): Payer: Self-pay

## 2023-05-31 ENCOUNTER — Other Ambulatory Visit (HOSPITAL_COMMUNITY): Payer: Self-pay

## 2023-06-14 ENCOUNTER — Other Ambulatory Visit (HOSPITAL_COMMUNITY): Payer: Self-pay

## 2023-06-14 ENCOUNTER — Other Ambulatory Visit: Payer: Self-pay

## 2023-06-24 ENCOUNTER — Other Ambulatory Visit (HOSPITAL_COMMUNITY): Payer: Self-pay

## 2023-06-24 ENCOUNTER — Other Ambulatory Visit: Payer: Self-pay

## 2023-06-28 ENCOUNTER — Telehealth: Payer: Self-pay | Admitting: Psychiatry

## 2023-06-28 NOTE — Telephone Encounter (Signed)
 Please see message from patient. I did not see mention of medication in last note.

## 2023-06-28 NOTE — Telephone Encounter (Signed)
 Pt called requesting since next apt 10/9 if possible to start the memory med CC had mentioned at last apt. Stated med had minimal side effect. RTC [id number]

## 2023-07-05 ENCOUNTER — Ambulatory Visit (INDEPENDENT_AMBULATORY_CARE_PROVIDER_SITE_OTHER): Admitting: Adult Health

## 2023-07-05 VITALS — BP 144/83 | HR 78

## 2023-07-05 DIAGNOSIS — G43709 Chronic migraine without aura, not intractable, without status migrainosus: Secondary | ICD-10-CM

## 2023-07-05 MED ORDER — ONABOTULINUMTOXINA 200 UNITS IJ SOLR
155.0000 [IU] | Freq: Once | INTRAMUSCULAR | Status: AC
Start: 2023-07-05 — End: 2023-07-05
  Administered 2023-07-05: 165 [IU] via INTRAMUSCULAR

## 2023-07-05 NOTE — Progress Notes (Signed)
 Update 07/05/2023 JM: patient returns for repeat botox . Prior injection with Dr. Ines on 04/11/2023.  She reports continued benefit with Botox  injections, continued >50% improvement of migraine frequency and severity. Continues to clench which can trigger migraines, masseter injections has helped relieve this. Tolerated procedure well today, return in 3 months for repeat injection.  He        Consent Form Botulism Toxin Injection For Chronic Migraine    Reviewed orally with patient, additionally signature is on file:  Botulism toxin has been approved by the Federal drug administration for treatment of chronic migraine. Botulism toxin does not cure chronic migraine and it may not be effective in some patients.  The administration of botulism toxin is accomplished by injecting a small amount of toxin into the muscles of the neck and head. Dosage must be titrated for each individual. Any benefits resulting from botulism toxin tend to wear off after 3 months with a repeat injection required if benefit is to be maintained. Injections are usually done every 3-4 months with maximum effect peak achieved by about 2 or 3 weeks. Botulism toxin is expensive and you should be sure of what costs you will incur resulting from the injection.  The side effects of botulism toxin use for chronic migraine may include:   -Transient, and usually mild, facial weakness with facial injections  -Transient, and usually mild, head or neck weakness with head/neck injections  -Reduction or loss of forehead facial animation due to forehead muscle weakness  -Eyelid drooping  -Dry eye  -Pain at the site of injection or bruising at the site of injection  -Double vision  -Potential unknown long term risks   Contraindications: You should not have Botox  if you are pregnant, nursing, allergic to albumin, have an infection, skin condition, or muscle weakness at the site of the injection, or have myasthenia gravis,  Lambert-Eaton syndrome, or ALS.  It is also possible that as with any injection, there may be an allergic reaction or no effect from the medication. Reduced effectiveness after repeated injections is sometimes seen and rarely infection at the injection site may occur. All care will be taken to prevent these side effects. If therapy is given over a long time, atrophy and wasting in the muscle injected may occur. Occasionally the patient's become refractory to treatment because they develop antibodies to the toxin. In this event, therapy needs to be modified.  I have read the above information and consent to the administration of botulism toxin.    BOTOX  PROCEDURE NOTE FOR MIGRAINE HEADACHE  Contraindications and precautions discussed with patient(above). Aseptic procedure was observed and patient tolerated procedure. Procedure performed by Harlene Bogaert, AGNP-BC.   The condition has existed for more than 6 months, and pt does not have a diagnosis of ALS, Myasthenia Gravis or Lambert-Eaton Syndrome.  Risks and benefits of injections discussed and pt agrees to proceed with the procedure.  Written consent obtained  These injections are medically necessary. Pt  receives good benefits from these injections. These injections do not cause sedations or hallucinations which the oral therapies may cause.   Description of procedure:  The patient was placed in a sitting position. The standard protocol was used for Botox  as follows, with 5 units of Botox  injected at each site:  -Procerus muscle, midline injection  -Corrugator muscle, bilateral injection  -Frontalis muscle, bilateral injection, with 2 sites each side, medial injection was performed in the upper one third of the frontalis muscle, in the region  vertical from the medial inferior edge of the superior orbital rim. The lateral injection was again in the upper one third of the forehead vertically above the lateral limbus of the cornea, 1.5 cm  lateral to the medial injection site.  -Temporalis muscle injection, 4 sites, bilaterally. The first injection was 3 cm above the tragus of the ear, second injection site was 1.5 cm to 3 cm up from the first injection site in line with the tragus of the ear. The third injection site was 1.5-3 cm forward between the first 2 injection sites. The fourth injection site was 1.5 cm posterior to the second injection site. 5th site laterally in the temporalis  muscleat the level of the outer canthus.  -Occipitalis muscle injection, 3 sites, bilaterally. The first injection was done one half way between the occipital protuberance and the tip of the mastoid process behind the ear. The second injection site was done lateral and superior to the first, 1 fingerbreadth from the first injection. The third injection site was 1 fingerbreadth superiorly and medially from the first injection site.  -Cervical paraspinal muscle injection, 2 sites, bilaterally. The first injection site was 1 cm from the midline of the cervical spine, 3 cm inferior to the lower border of the occipital protuberance. The second injection site was 1.5 cm superiorly and laterally to the first injection site.  -Trapezius muscle injection was performed at 3 sites, bilaterally. The first injection site was in the upper trapezius muscle halfway between the inflection point of the neck, and the acromion. The second injection site was one half way between the acromion and the first injection site. The third injection was done between the first injection site and the inflection point of the neck.  -Masseter muscle injection performed at 1 site bilaterally.    A total of 200 units of Botox  was prepared, 165 units of Botox  was injected as documented above, any Botox  not injected was wasted. The patient tolerated the procedure well, there were no complications of the above procedure.   Harlene Bogaert, AGNP-BC  [facility] [address] [place], [place] [address]  Phone [phone] Fax [phone] Note: This document was prepared with digital dictation and possible smart phrase technology. Any transcriptional errors that result from this process are unintentional.

## 2023-07-05 NOTE — Progress Notes (Signed)
 Botox - 200 units x 1 vial Lot: [id number] Expiration: 10/2025 NDC: 9976-6078-97   Bacteriostatic 0.9% Sodium Chloride- * mL  Lot: [id number] Expiration: 11/12/2023 NDC: 9590-8033-97   Dx: H56.290 B/B Witnessed by: Rojean DEL

## 2023-07-08 ENCOUNTER — Other Ambulatory Visit: Payer: Self-pay

## 2023-07-08 ENCOUNTER — Other Ambulatory Visit (HOSPITAL_COMMUNITY): Payer: Self-pay

## 2023-07-08 MED ORDER — IPRATROPIUM BROMIDE 0.03 % NA SOLN
2.0000 | Freq: Two times a day (BID) | NASAL | 3 refills | Status: AC
  Filled 2023-07-08: qty 30, 75d supply, fill #0
  Filled 2023-09-09: qty 30, 75d supply, fill #1
  Filled 2023-10-31 – 2023-11-11 (×2): qty 30, 75d supply, fill #2

## 2023-07-13 ENCOUNTER — Encounter: Payer: Self-pay | Admitting: Psychiatry

## 2023-07-13 ENCOUNTER — Other Ambulatory Visit: Payer: Self-pay | Admitting: Psychiatry

## 2023-07-13 MED ORDER — MEMANTINE HCL 10 MG PO TABS
ORAL_TABLET | ORAL | 0 refills | Status: AC
  Filled 2023-07-13: qty 159, 90d supply, fill #0

## 2023-07-13 NOTE — Telephone Encounter (Signed)
 Patient said she did get MyChart message.

## 2023-07-13 NOTE — Telephone Encounter (Signed)
 Sorry I overlooked this.   The memory med is memantine 10 mg tablet, 1/2 tablet daily for 1 week then 1/2 tablet twice daily for 1 week, then 1/2 tablet in the morning and 1 tablet at night for 1 week, then 1 tablet twice daily.   Sent RX Sent message via My Chart.  Pls verify she got it. Lorene Macintosh, MD, DFAPA

## 2023-07-13 NOTE — Progress Notes (Signed)
 SABRA

## 2023-07-14 ENCOUNTER — Other Ambulatory Visit (HOSPITAL_COMMUNITY): Payer: Self-pay

## 2023-07-19 ENCOUNTER — Other Ambulatory Visit: Payer: Self-pay

## 2023-07-19 ENCOUNTER — Other Ambulatory Visit (HOSPITAL_COMMUNITY): Payer: Self-pay

## 2023-07-29 ENCOUNTER — Other Ambulatory Visit: Payer: Self-pay

## 2023-07-29 ENCOUNTER — Other Ambulatory Visit (HOSPITAL_COMMUNITY): Payer: Self-pay

## 2023-08-19 DIAGNOSIS — L601 Onycholysis: Secondary | ICD-10-CM | POA: Diagnosis not present

## 2023-08-19 DIAGNOSIS — R234 Changes in skin texture: Secondary | ICD-10-CM | POA: Diagnosis not present

## 2023-08-24 ENCOUNTER — Other Ambulatory Visit (HOSPITAL_COMMUNITY): Payer: Self-pay

## 2023-08-24 ENCOUNTER — Other Ambulatory Visit: Payer: Self-pay

## 2023-09-09 ENCOUNTER — Other Ambulatory Visit: Payer: Self-pay

## 2023-09-09 ENCOUNTER — Other Ambulatory Visit (HOSPITAL_COMMUNITY): Payer: Self-pay

## 2023-09-09 IMAGING — DX DG CHEST 2V
2 series · 2 of 2 positions shown · non-contrast
Comparison: 12/10/2008

CLINICAL DATA: 70-year-old female with cough

EXAM:
CHEST - 2 VIEW

[chest pa]
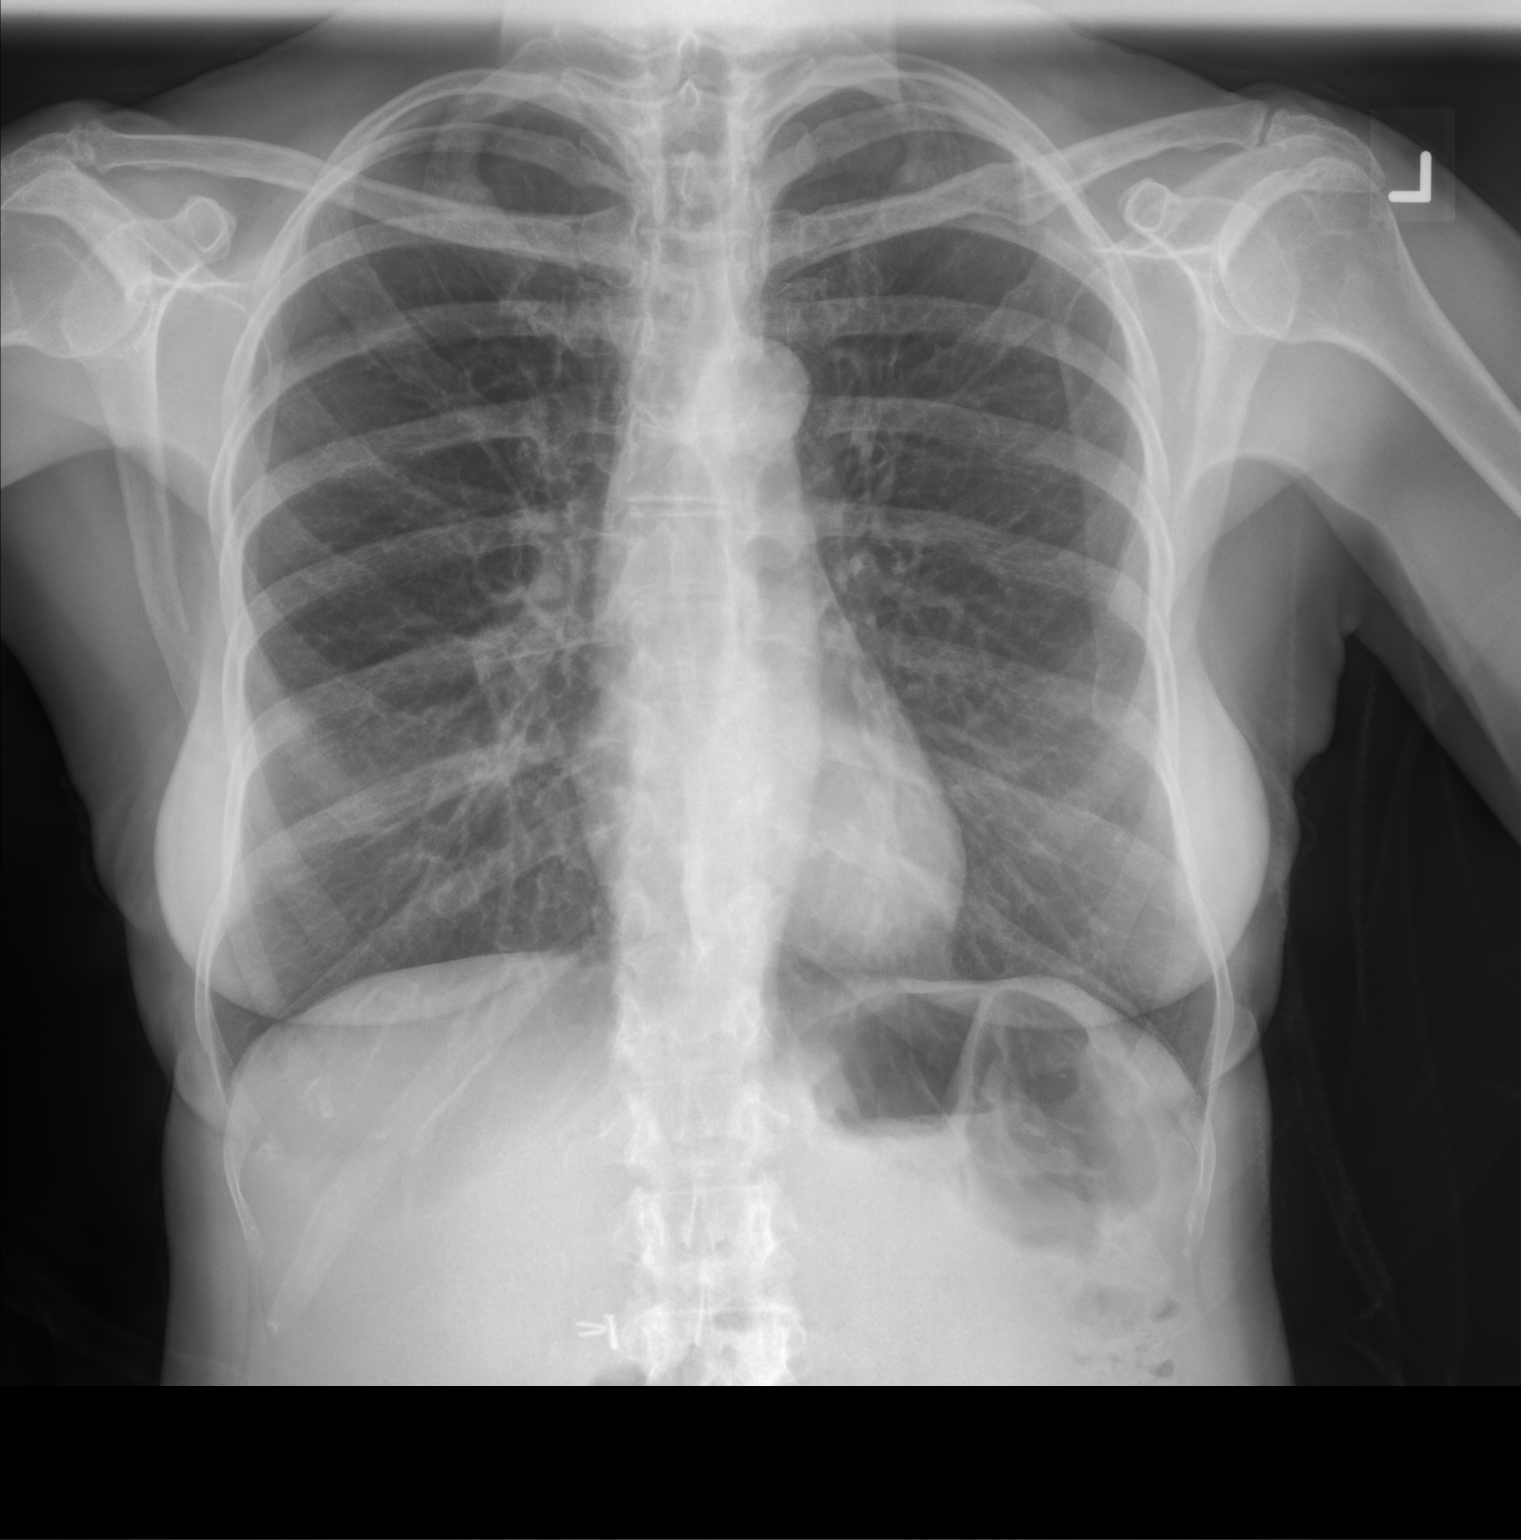

[chest lat]
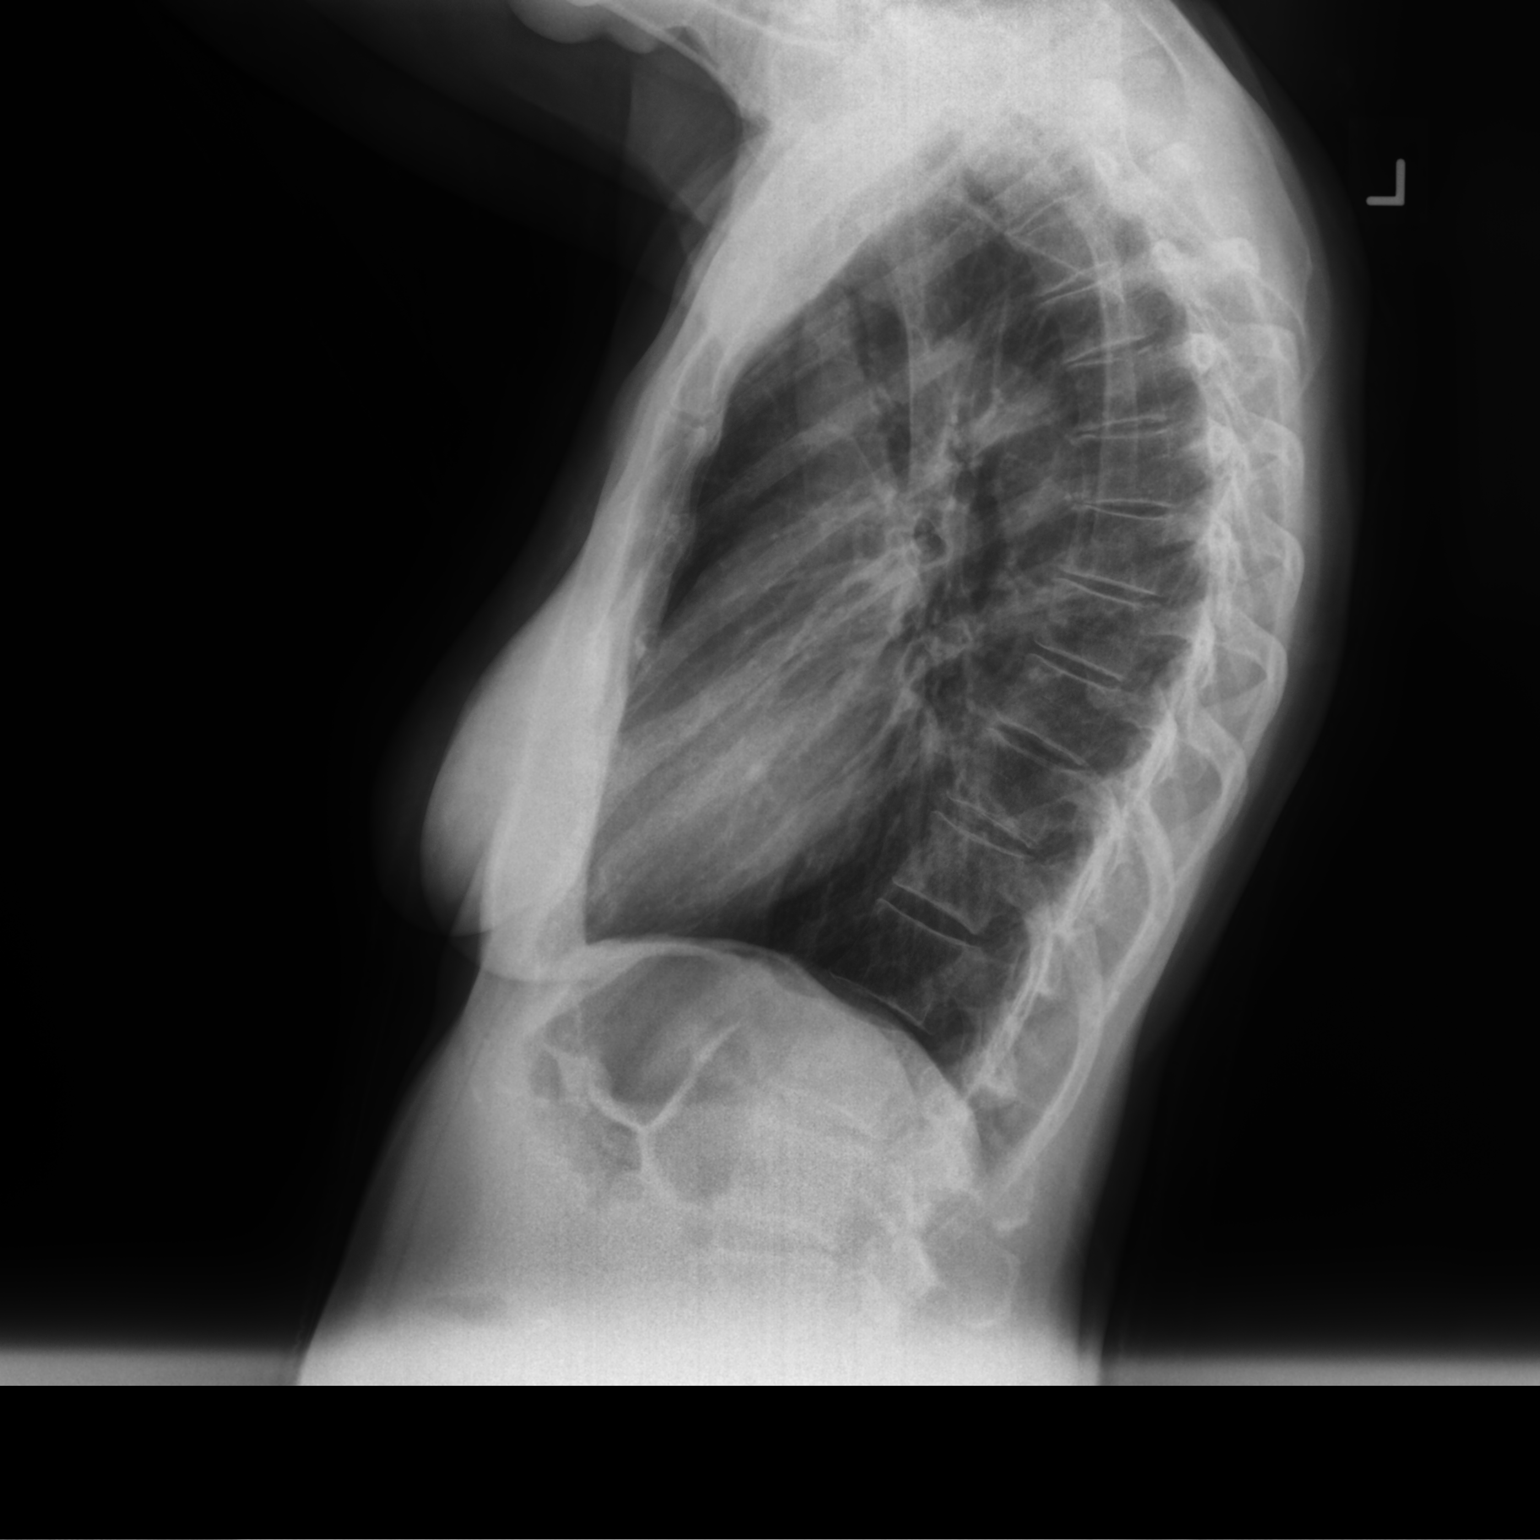

[2 of 2 positions shown; findings below may reference images not displayed]

FINDINGS: Cardiomediastinal silhouette unchanged in size and contour. No
evidence of central vascular congestion. No interlobular septal
thickening.

No pneumothorax or pleural effusion. Coarsened interstitial
markings, with no confluent airspace disease.

No acute displaced fracture. Degenerative changes of the spine.
IMPRESSION: No active cardiopulmonary disease.

## 2023-09-13 ENCOUNTER — Other Ambulatory Visit: Payer: Self-pay

## 2023-09-14 ENCOUNTER — Other Ambulatory Visit: Payer: Self-pay | Admitting: Psychiatry

## 2023-09-14 ENCOUNTER — Other Ambulatory Visit (HOSPITAL_COMMUNITY): Payer: Self-pay

## 2023-09-14 DIAGNOSIS — J4520 Mild intermittent asthma, uncomplicated: Secondary | ICD-10-CM | POA: Diagnosis not present

## 2023-09-14 DIAGNOSIS — M8589 Other specified disorders of bone density and structure, multiple sites: Secondary | ICD-10-CM | POA: Diagnosis not present

## 2023-09-14 DIAGNOSIS — M159 Polyosteoarthritis, unspecified: Secondary | ICD-10-CM | POA: Diagnosis not present

## 2023-09-14 DIAGNOSIS — M797 Fibromyalgia: Secondary | ICD-10-CM | POA: Diagnosis not present

## 2023-09-14 DIAGNOSIS — E039 Hypothyroidism, unspecified: Secondary | ICD-10-CM | POA: Diagnosis not present

## 2023-09-14 DIAGNOSIS — D509 Iron deficiency anemia, unspecified: Secondary | ICD-10-CM | POA: Diagnosis not present

## 2023-09-14 DIAGNOSIS — Z1331 Encounter for screening for depression: Secondary | ICD-10-CM | POA: Diagnosis not present

## 2023-09-14 DIAGNOSIS — R7303 Prediabetes: Secondary | ICD-10-CM | POA: Diagnosis not present

## 2023-09-14 DIAGNOSIS — K219 Gastro-esophageal reflux disease without esophagitis: Secondary | ICD-10-CM | POA: Diagnosis not present

## 2023-09-14 DIAGNOSIS — F331 Major depressive disorder, recurrent, moderate: Secondary | ICD-10-CM

## 2023-09-14 DIAGNOSIS — Z79899 Other long term (current) drug therapy: Secondary | ICD-10-CM | POA: Diagnosis not present

## 2023-09-14 DIAGNOSIS — G43709 Chronic migraine without aura, not intractable, without status migrainosus: Secondary | ICD-10-CM | POA: Diagnosis not present

## 2023-09-14 DIAGNOSIS — M810 Age-related osteoporosis without current pathological fracture: Secondary | ICD-10-CM | POA: Diagnosis not present

## 2023-09-14 DIAGNOSIS — Z0000 Encounter for general adult medical examination without abnormal findings: Secondary | ICD-10-CM | POA: Diagnosis not present

## 2023-09-14 MED ORDER — ARIPIPRAZOLE 5 MG PO TABS
5.0000 mg | ORAL_TABLET | Freq: Every day | ORAL | 0 refills | Status: DC
  Filled 2023-09-14: qty 90, 90d supply, fill #0

## 2023-09-15 ENCOUNTER — Other Ambulatory Visit (HOSPITAL_COMMUNITY): Payer: Self-pay

## 2023-09-15 ENCOUNTER — Other Ambulatory Visit: Payer: Self-pay

## 2023-09-15 DIAGNOSIS — Z85828 Personal history of other malignant neoplasm of skin: Secondary | ICD-10-CM | POA: Diagnosis not present

## 2023-09-15 DIAGNOSIS — L988 Other specified disorders of the skin and subcutaneous tissue: Secondary | ICD-10-CM | POA: Diagnosis not present

## 2023-09-15 DIAGNOSIS — H019 Unspecified inflammation of eyelid: Secondary | ICD-10-CM | POA: Diagnosis not present

## 2023-09-15 DIAGNOSIS — D2271 Melanocytic nevi of right lower limb, including hip: Secondary | ICD-10-CM | POA: Diagnosis not present

## 2023-09-15 DIAGNOSIS — L821 Other seborrheic keratosis: Secondary | ICD-10-CM | POA: Diagnosis not present

## 2023-09-15 DIAGNOSIS — L570 Actinic keratosis: Secondary | ICD-10-CM | POA: Diagnosis not present

## 2023-09-15 DIAGNOSIS — D225 Melanocytic nevi of trunk: Secondary | ICD-10-CM | POA: Diagnosis not present

## 2023-09-15 DIAGNOSIS — L578 Other skin changes due to chronic exposure to nonionizing radiation: Secondary | ICD-10-CM | POA: Diagnosis not present

## 2023-09-15 DIAGNOSIS — L720 Epidermal cyst: Secondary | ICD-10-CM | POA: Diagnosis not present

## 2023-09-15 DIAGNOSIS — L738 Other specified follicular disorders: Secondary | ICD-10-CM | POA: Diagnosis not present

## 2023-09-15 MED ORDER — FLUOROURACIL 5 % EX CREA
TOPICAL_CREAM | CUTANEOUS | 0 refills | Status: AC
  Filled 2023-09-15: qty 40, 30d supply, fill #0

## 2023-09-16 ENCOUNTER — Other Ambulatory Visit (HOSPITAL_COMMUNITY): Payer: Self-pay

## 2023-09-20 ENCOUNTER — Other Ambulatory Visit (HOSPITAL_COMMUNITY): Payer: Self-pay

## 2023-09-21 ENCOUNTER — Other Ambulatory Visit (HOSPITAL_COMMUNITY): Payer: Self-pay

## 2023-09-21 MED ORDER — FEXOFENADINE HCL 180 MG PO TABS
180.0000 mg | ORAL_TABLET | Freq: Every day | ORAL | 3 refills | Status: AC
  Filled 2023-09-21: qty 90, 90d supply, fill #0
  Filled 2024-01-09: qty 90, 90d supply, fill #1

## 2023-09-22 ENCOUNTER — Other Ambulatory Visit (HOSPITAL_COMMUNITY): Payer: Self-pay

## 2023-09-22 ENCOUNTER — Other Ambulatory Visit: Payer: Self-pay

## 2023-09-27 ENCOUNTER — Ambulatory Visit (INDEPENDENT_AMBULATORY_CARE_PROVIDER_SITE_OTHER): Admitting: Adult Health

## 2023-09-27 VITALS — BP 137/77 | HR 71

## 2023-09-27 DIAGNOSIS — G43709 Chronic migraine without aura, not intractable, without status migrainosus: Secondary | ICD-10-CM | POA: Diagnosis not present

## 2023-09-27 MED ORDER — ONABOTULINUMTOXINA 100 UNITS IJ SOLR
155.0000 [IU] | Freq: Once | INTRAMUSCULAR | Status: AC
  Administered 2023-09-27: 165 [IU] via INTRAMUSCULAR

## 2023-09-27 NOTE — Progress Notes (Signed)
 Update 09/27/2023 JM: patient returns for repeat botox . Prior injection on 07/07/2023.  She reports continued benefit with Botox  injections, continued >50% improvement of migraine frequency and severity. Continues to clench jaw which can trigger migraines, masseter injections has helped relieve this. Tolerated procedure well today, return in 3 months for repeat injection.          Consent Form Botulism Toxin Injection For Chronic Migraine    Reviewed orally with patient, additionally signature is on file:  Botulism toxin has been approved by the Federal drug administration for treatment of chronic migraine. Botulism toxin does not cure chronic migraine and it may not be effective in some patients.  The administration of botulism toxin is accomplished by injecting a small amount of toxin into the muscles of the neck and head. Dosage must be titrated for each individual. Any benefits resulting from botulism toxin tend to wear off after 3 months with a repeat injection required if benefit is to be maintained. Injections are usually done every 3-4 months with maximum effect peak achieved by about 2 or 3 weeks. Botulism toxin is expensive and you should be sure of what costs you will incur resulting from the injection.  The side effects of botulism toxin use for chronic migraine may include:   -Transient, and usually mild, facial weakness with facial injections  -Transient, and usually mild, head or neck weakness with head/neck injections  -Reduction or loss of forehead facial animation due to forehead muscle weakness  -Eyelid drooping  -Dry eye  -Pain at the site of injection or bruising at the site of injection  -Double vision  -Potential unknown long term risks   Contraindications: You should not have Botox  if you are pregnant, nursing, allergic to albumin, have an infection, skin condition, or muscle weakness at the site of the injection, or have myasthenia gravis, Lambert-Eaton  syndrome, or ALS.  It is also possible that as with any injection, there may be an allergic reaction or no effect from the medication. Reduced effectiveness after repeated injections is sometimes seen and rarely infection at the injection site may occur. All care will be taken to prevent these side effects. If therapy is given over a long time, atrophy and wasting in the muscle injected may occur. Occasionally the patient's become refractory to treatment because they develop antibodies to the toxin. In this event, therapy needs to be modified.  I have read the above information and consent to the administration of botulism toxin.    BOTOX  PROCEDURE NOTE FOR MIGRAINE HEADACHE  Contraindications and precautions discussed with patient(above). Aseptic procedure was observed and patient tolerated procedure. Procedure performed by Harlene Bogaert, AGNP-BC.   The condition has existed for more than 6 months, and pt does not have a diagnosis of ALS, Myasthenia Gravis or Lambert-Eaton Syndrome.  Risks and benefits of injections discussed and pt agrees to proceed with the procedure.  Written consent obtained  These injections are medically necessary. Pt  receives good benefits from these injections. These injections do not cause sedations or hallucinations which the oral therapies may cause.   Description of procedure:  The patient was placed in a sitting position. The standard protocol was used for Botox  as follows, with 5 units of Botox  injected at each site:  -Procerus muscle, midline injection  -Corrugator muscle, bilateral injection  -Frontalis muscle, bilateral injection, with 2 sites each side, medial injection was performed in the upper one third of the frontalis muscle, in the region vertical from  the medial inferior edge of the superior orbital rim. The lateral injection was again in the upper one third of the forehead vertically above the lateral limbus of the cornea, 1.5 cm lateral to the  medial injection site.  -Temporalis muscle injection, 4 sites, bilaterally. The first injection was 3 cm above the tragus of the ear, second injection site was 1.5 cm to 3 cm up from the first injection site in line with the tragus of the ear. The third injection site was 1.5-3 cm forward between the first 2 injection sites. The fourth injection site was 1.5 cm posterior to the second injection site. 5th site laterally in the temporalis  muscleat the level of the outer canthus.  -Occipitalis muscle injection, 3 sites, bilaterally. The first injection was done one half way between the occipital protuberance and the tip of the mastoid process behind the ear. The second injection site was done lateral and superior to the first, 1 fingerbreadth from the first injection. The third injection site was 1 fingerbreadth superiorly and medially from the first injection site.  -Cervical paraspinal muscle injection, 2 sites, bilaterally. The first injection site was 1 cm from the midline of the cervical spine, 3 cm inferior to the lower border of the occipital protuberance. The second injection site was 1.5 cm superiorly and laterally to the first injection site.  -Trapezius muscle injection was performed at 3 sites, bilaterally. The first injection site was in the upper trapezius muscle halfway between the inflection point of the neck, and the acromion. The second injection site was one half way between the acromion and the first injection site. The third injection was done between the first injection site and the inflection point of the neck.  -Masseter muscle injection performed at 1 site bilaterally.    A total of 200 units of Botox  was prepared, 165 units of Botox  was injected as documented above, any Botox  not injected was wasted. The patient tolerated the procedure well, there were no complications of the above procedure.   Harlene Bogaert, AGNP-BC  [facility] [address] [place], [place] [address]  Phone [phone] Fax [phone] Note: This document was prepared with digital dictation and possible smart phrase technology. Any transcriptional errors that result from this process are unintentional.

## 2023-09-27 NOTE — Progress Notes (Signed)
 Botox - 100 units x 2 vial Lot: [id number] Expiration: 11/2025 NDC: 9976-8854-98   Bacteriostatic 0.9% Sodium Chloride- 4 mL  Lot: [id number] Expiration: 11/10/2024 NDC: 9590-8033-97   Dx: H56.290 B/B Witnessed by Catheryn RN

## 2023-10-04 ENCOUNTER — Other Ambulatory Visit (HOSPITAL_COMMUNITY): Payer: Self-pay

## 2023-10-04 ENCOUNTER — Other Ambulatory Visit: Payer: Self-pay

## 2023-10-04 ENCOUNTER — Other Ambulatory Visit: Payer: Self-pay | Admitting: Psychiatry

## 2023-10-04 DIAGNOSIS — F331 Major depressive disorder, recurrent, moderate: Secondary | ICD-10-CM

## 2023-10-04 DIAGNOSIS — F411 Generalized anxiety disorder: Secondary | ICD-10-CM

## 2023-10-04 DIAGNOSIS — F4010 Social phobia, unspecified: Secondary | ICD-10-CM

## 2023-10-04 MED ORDER — DULOXETINE HCL 30 MG PO CPEP
90.0000 mg | ORAL_CAPSULE | Freq: Every day | ORAL | 0 refills | Status: DC
  Filled 2023-10-04: qty 270, 90d supply, fill #0

## 2023-10-05 ENCOUNTER — Other Ambulatory Visit: Payer: Self-pay

## 2023-10-13 ENCOUNTER — Other Ambulatory Visit (HOSPITAL_COMMUNITY): Payer: Self-pay

## 2023-10-13 MED ORDER — FLUCONAZOLE 200 MG PO TABS
200.0000 mg | ORAL_TABLET | ORAL | 0 refills | Status: AC
  Filled 2023-10-13: qty 12, 84d supply, fill #0

## 2023-10-19 DIAGNOSIS — K219 Gastro-esophageal reflux disease without esophagitis: Secondary | ICD-10-CM | POA: Diagnosis not present

## 2023-10-20 ENCOUNTER — Encounter: Payer: Self-pay | Admitting: Psychiatry

## 2023-10-20 ENCOUNTER — Ambulatory Visit: Admitting: Psychiatry

## 2023-10-20 DIAGNOSIS — F5105 Insomnia due to other mental disorder: Secondary | ICD-10-CM

## 2023-10-20 DIAGNOSIS — G4710 Hypersomnia, unspecified: Secondary | ICD-10-CM

## 2023-10-20 DIAGNOSIS — F331 Major depressive disorder, recurrent, moderate: Secondary | ICD-10-CM | POA: Diagnosis not present

## 2023-10-20 DIAGNOSIS — F411 Generalized anxiety disorder: Secondary | ICD-10-CM

## 2023-10-20 DIAGNOSIS — M25561 Pain in right knee: Secondary | ICD-10-CM | POA: Diagnosis not present

## 2023-10-20 DIAGNOSIS — F900 Attention-deficit hyperactivity disorder, predominantly inattentive type: Secondary | ICD-10-CM

## 2023-10-20 DIAGNOSIS — F4010 Social phobia, unspecified: Secondary | ICD-10-CM

## 2023-10-20 DIAGNOSIS — M25562 Pain in left knee: Secondary | ICD-10-CM | POA: Diagnosis not present

## 2023-10-20 NOTE — Progress Notes (Signed)
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 potential SE Off label trial of Auveltiy 1 at night for chronic tR cough in place of tramadol  which has brief benefit.  Disc SE  FU 2 mos if stays on Auvelity.  Can delay 4-6 mos if not  Lorene Macintosh, MD, DFAPA   Please see After Visit Summary for patient specific instructions.  Future Appointments  Date Time Provider Department Center  12/20/2023 12:45 PM Whitfield Raisin, NP [facility] None  01/10/2024  1:45 PM Gregg Lek, MD [facility] None  03/14/2024  1:00 PM Dolphus Reiter, MD [facility] None    No orders of the defined types were placed in this encounter.      -------------------------------

## 2023-10-21 ENCOUNTER — Other Ambulatory Visit (HOSPITAL_COMMUNITY): Payer: Self-pay

## 2023-10-24 ENCOUNTER — Other Ambulatory Visit (HOSPITAL_COMMUNITY): Payer: Self-pay

## 2023-10-24 ENCOUNTER — Other Ambulatory Visit: Payer: Self-pay

## 2023-10-24 MED ORDER — AZELASTINE HCL 137 MCG/SPRAY NA SOLN
NASAL | 5 refills | Status: AC
  Filled 2023-10-24: qty 30, 30d supply, fill #0
  Filled 2023-12-12: qty 30, 30d supply, fill #1
  Filled 2024-01-09: qty 30, 30d supply, fill #2

## 2023-10-25 ENCOUNTER — Telehealth: Payer: Self-pay | Admitting: Rheumatology

## 2023-10-25 NOTE — Telephone Encounter (Signed)
 Patient advised Dr. Dolphus does not recall the name of the gastroenterologist.  She may see someone at [facility] gastroenterology. Patient expressed understanding.

## 2023-10-25 NOTE — Telephone Encounter (Signed)
 I do not recall the name of the gastroenterologist.  She may see someone at [facility] gastroenterology.

## 2023-10-25 NOTE — Telephone Encounter (Signed)
 Patient left a voicemail stating at her last appointment Dr. Dolphus recommended a gastroenterologist and she cannot remember their name.  Patient requested a return call.

## 2023-10-26 ENCOUNTER — Other Ambulatory Visit (HOSPITAL_COMMUNITY): Payer: Self-pay

## 2023-10-31 ENCOUNTER — Other Ambulatory Visit (HOSPITAL_COMMUNITY): Payer: Self-pay

## 2023-11-07 ENCOUNTER — Other Ambulatory Visit: Payer: Self-pay

## 2023-11-07 ENCOUNTER — Other Ambulatory Visit: Payer: Self-pay | Admitting: Psychiatry

## 2023-11-07 ENCOUNTER — Other Ambulatory Visit (HOSPITAL_COMMUNITY): Payer: Self-pay

## 2023-11-07 DIAGNOSIS — F411 Generalized anxiety disorder: Secondary | ICD-10-CM

## 2023-11-07 DIAGNOSIS — F900 Attention-deficit hyperactivity disorder, predominantly inattentive type: Secondary | ICD-10-CM

## 2023-11-07 DIAGNOSIS — F331 Major depressive disorder, recurrent, moderate: Secondary | ICD-10-CM

## 2023-11-07 DIAGNOSIS — F4010 Social phobia, unspecified: Secondary | ICD-10-CM

## 2023-11-07 DIAGNOSIS — G4710 Hypersomnia, unspecified: Secondary | ICD-10-CM

## 2023-11-07 MED ORDER — MODAFINIL 100 MG PO TABS
ORAL_TABLET | ORAL | 5 refills | Status: AC
  Filled 2023-11-07: qty 60, 30d supply, fill #0
  Filled 2023-12-12: qty 60, 30d supply, fill #1
  Filled 2024-01-09: qty 60, 30d supply, fill #2

## 2023-11-07 MED ORDER — DULOXETINE HCL 30 MG PO CPEP
90.0000 mg | ORAL_CAPSULE | Freq: Every day | ORAL | 0 refills | Status: AC
  Filled 2023-11-07 – 2024-01-02 (×2): qty 270, 90d supply, fill #0

## 2023-11-07 MED ORDER — LITHIUM CARBONATE 150 MG PO CAPS
150.0000 mg | ORAL_CAPSULE | Freq: Every day | ORAL | 3 refills | Status: AC
Start: 2023-11-07 — End: ?
  Filled 2023-11-07: qty 90, 90d supply, fill #0

## 2023-11-07 MED ORDER — ARIPIPRAZOLE 5 MG PO TABS
5.0000 mg | ORAL_TABLET | Freq: Every day | ORAL | 0 refills | Status: AC
  Filled 2023-11-07 – 2023-12-12 (×2): qty 90, 90d supply, fill #0

## 2023-11-08 ENCOUNTER — Other Ambulatory Visit (HOSPITAL_COMMUNITY): Payer: Self-pay

## 2023-11-08 MED ORDER — THYROID 90 MG PO TABS
90.0000 mg | ORAL_TABLET | Freq: Every day | ORAL | 3 refills | Status: AC
  Filled 2023-11-08 – 2023-12-15 (×2): qty 90, 90d supply, fill #0

## 2023-11-09 DIAGNOSIS — M25561 Pain in right knee: Secondary | ICD-10-CM | POA: Diagnosis not present

## 2023-11-14 ENCOUNTER — Ambulatory Visit: Admitting: Neurology

## 2023-11-16 DIAGNOSIS — M25561 Pain in right knee: Secondary | ICD-10-CM | POA: Diagnosis not present

## 2023-11-17 ENCOUNTER — Other Ambulatory Visit (HOSPITAL_COMMUNITY): Payer: Self-pay

## 2023-11-28 ENCOUNTER — Other Ambulatory Visit (HOSPITAL_COMMUNITY): Payer: Self-pay

## 2023-11-28 ENCOUNTER — Other Ambulatory Visit: Payer: Self-pay

## 2023-11-28 DIAGNOSIS — R059 Cough, unspecified: Secondary | ICD-10-CM | POA: Diagnosis not present

## 2023-11-28 DIAGNOSIS — J4520 Mild intermittent asthma, uncomplicated: Secondary | ICD-10-CM | POA: Diagnosis not present

## 2023-11-28 DIAGNOSIS — J301 Allergic rhinitis due to pollen: Secondary | ICD-10-CM | POA: Diagnosis not present

## 2023-11-28 MED ORDER — ALBUTEROL SULFATE HFA 108 (90 BASE) MCG/ACT IN AERS
2.0000 | INHALATION_SPRAY | Freq: Four times a day (QID) | RESPIRATORY_TRACT | 11 refills | Status: AC | PRN
  Filled 2023-11-28: qty 18, 25d supply, fill #0

## 2023-12-12 ENCOUNTER — Other Ambulatory Visit: Payer: Self-pay | Admitting: Psychiatry

## 2023-12-12 ENCOUNTER — Other Ambulatory Visit (HOSPITAL_COMMUNITY): Payer: Self-pay

## 2023-12-12 ENCOUNTER — Other Ambulatory Visit: Payer: Self-pay

## 2023-12-12 DIAGNOSIS — F331 Major depressive disorder, recurrent, moderate: Secondary | ICD-10-CM

## 2023-12-12 MED ORDER — ARIPIPRAZOLE 5 MG PO TABS
5.0000 mg | ORAL_TABLET | Freq: Every day | ORAL | 0 refills | Status: AC
  Filled 2023-12-12: qty 90, 90d supply, fill #0

## 2023-12-13 ENCOUNTER — Other Ambulatory Visit (HOSPITAL_COMMUNITY): Payer: Self-pay

## 2023-12-13 ENCOUNTER — Other Ambulatory Visit: Payer: Self-pay

## 2023-12-13 MED ORDER — TRAMADOL HCL 50 MG PO TABS
50.0000 mg | ORAL_TABLET | Freq: Two times a day (BID) | ORAL | 5 refills | Status: AC | PRN
  Filled 2023-12-13: qty 60, 30d supply, fill #0

## 2023-12-14 DIAGNOSIS — L570 Actinic keratosis: Secondary | ICD-10-CM | POA: Diagnosis not present

## 2023-12-14 DIAGNOSIS — L820 Inflamed seborrheic keratosis: Secondary | ICD-10-CM | POA: Diagnosis not present

## 2023-12-15 ENCOUNTER — Other Ambulatory Visit (HOSPITAL_COMMUNITY): Payer: Self-pay

## 2023-12-15 ENCOUNTER — Other Ambulatory Visit: Payer: Self-pay

## 2023-12-20 ENCOUNTER — Ambulatory Visit (INDEPENDENT_AMBULATORY_CARE_PROVIDER_SITE_OTHER): Admitting: Adult Health

## 2023-12-20 ENCOUNTER — Other Ambulatory Visit (HOSPITAL_COMMUNITY): Payer: Self-pay

## 2023-12-20 VITALS — BP 152/81

## 2023-12-20 DIAGNOSIS — G43709 Chronic migraine without aura, not intractable, without status migrainosus: Secondary | ICD-10-CM | POA: Diagnosis not present

## 2023-12-20 MED ORDER — NARATRIPTAN HCL 2.5 MG PO TABS
2.5000 mg | ORAL_TABLET | ORAL | 11 refills | Status: AC | PRN
  Filled 2023-12-20: qty 9, 30d supply, fill #0
  Filled 2024-01-09: qty 9, 30d supply, fill #1

## 2023-12-20 MED ORDER — ONABOTULINUMTOXINA 100 UNITS IJ SOLR
155.0000 [IU] | Freq: Once | INTRAMUSCULAR | Status: AC
  Administered 2023-12-20: 155 [IU] via INTRAMUSCULAR

## 2023-12-20 NOTE — Progress Notes (Signed)
 Update 09/27/2023 JM: patient returns for repeat botox . Prior injection on 07/07/2023.  She reports continued benefit with Botox  injections, continued >50% improvement of migraine frequency and severity. Use of Ubrelvy  with benefit but high cost, applies for financial assistance program which was recently approved but never received Ubrelvy , has been using sumatriptan  but difficulty tolerating due to side effects. Will try naratriptan  2.5 mg as needed, can repeat after 4 hours if needed.  Previously tried rizatriptan without benefit.  Tolerated procedure well today, return in 3 months for repeat injection.          Consent Form Botulism Toxin Injection For Chronic Migraine    Reviewed orally with patient, additionally signature is on file:  Botulism toxin has been approved by the Federal drug administration for treatment of chronic migraine. Botulism toxin does not cure chronic migraine and it may not be effective in some patients.  The administration of botulism toxin is accomplished by injecting a small amount of toxin into the muscles of the neck and head. Dosage must be titrated for each individual. Any benefits resulting from botulism toxin tend to wear off after 3 months with a repeat injection required if benefit is to be maintained. Injections are usually done every 3-4 months with maximum effect peak achieved by about 2 or 3 weeks. Botulism toxin is expensive and you should be sure of what costs you will incur resulting from the injection.  The side effects of botulism toxin use for chronic migraine may include:   -Transient, and usually mild, facial weakness with facial injections  -Transient, and usually mild, head or neck weakness with head/neck injections  -Reduction or loss of forehead facial animation due to forehead muscle weakness  -Eyelid drooping  -Dry eye  -Pain at the site of injection or bruising at the site of injection  -Double vision  -Potential unknown long  term risks   Contraindications: You should not have Botox  if you are pregnant, nursing, allergic to albumin, have an infection, skin condition, or muscle weakness at the site of the injection, or have myasthenia gravis, Lambert-Eaton syndrome, or ALS.  It is also possible that as with any injection, there may be an allergic reaction or no effect from the medication. Reduced effectiveness after repeated injections is sometimes seen and rarely infection at the injection site may occur. All care will be taken to prevent these side effects. If therapy is given over a long time, atrophy and wasting in the muscle injected may occur. Occasionally the patient's become refractory to treatment because they develop antibodies to the toxin. In this event, therapy needs to be modified.  I have read the above information and consent to the administration of botulism toxin.    BOTOX  PROCEDURE NOTE FOR MIGRAINE HEADACHE  Contraindications and precautions discussed with patient(above). Aseptic procedure was observed and patient tolerated procedure. Procedure performed by Harlene Bogaert, AGNP-BC.   The condition has existed for more than 6 months, and pt does not have a diagnosis of ALS, Myasthenia Gravis or Lambert-Eaton Syndrome.  Risks and benefits of injections discussed and pt agrees to proceed with the procedure.  Written consent obtained  These injections are medically necessary. Pt  receives good benefits from these injections. These injections do not cause sedations or hallucinations which the oral therapies may cause.   Description of procedure:  The patient was placed in a sitting position. The standard protocol was used for Botox  as follows, with 5 units of Botox  injected at each  site:  -Procerus muscle, midline injection  -Corrugator muscle, bilateral injection  -Frontalis muscle, bilateral injection, with 2 sites each side, medial injection was performed in the upper one third of the frontalis  muscle, in the region vertical from the medial inferior edge of the superior orbital rim. The lateral injection was again in the upper one third of the forehead vertically above the lateral limbus of the cornea, 1.5 cm lateral to the medial injection site.  -Temporalis muscle injection, 4 sites, bilaterally. The first injection was 3 cm above the tragus of the ear, second injection site was 1.5 cm to 3 cm up from the first injection site in line with the tragus of the ear. The third injection site was 1.5-3 cm forward between the first 2 injection sites. The fourth injection site was 1.5 cm posterior to the second injection site. 5th site laterally in the temporalis  muscleat the level of the outer canthus.  -Occipitalis muscle injection, 3 sites, bilaterally. The first injection was done one half way between the occipital protuberance and the tip of the mastoid process behind the ear. The second injection site was done lateral and superior to the first, 1 fingerbreadth from the first injection. The third injection site was 1 fingerbreadth superiorly and medially from the first injection site.  -Cervical paraspinal muscle injection, 2 sites, bilaterally. The first injection site was 1 cm from the midline of the cervical spine, 3 cm inferior to the lower border of the occipital protuberance. The second injection site was 1.5 cm superiorly and laterally to the first injection site.  -Trapezius muscle injection was performed at 3 sites, bilaterally. The first injection site was in the upper trapezius muscle halfway between the inflection point of the neck, and the acromion. The second injection site was one half way between the acromion and the first injection site. The third injection was done between the first injection site and the inflection point of the neck.     A total of 200 units of Botox  was prepared, 155 units of Botox  was injected as documented above, any Botox  not injected was wasted. The  patient tolerated the procedure well, there were no complications of the above procedure.   Harlene Bogaert, AGNP-BC  [facility] [address] [place], [place] [address]  Phone [phone] Fax [phone] Note: This document was prepared with digital dictation and possible smart phrase technology. Any transcriptional errors that result from this process are unintentional.

## 2023-12-20 NOTE — Progress Notes (Signed)
 Botox - 100 units x 2 vials Lot: [id number] Expiration: 2027/05 NDC: 9976-8854-98  Bacteriostatic 0.9% Sodium Chloride- 4 mL  Lot: [id number] Expiration: 11/10/24 NDC: [id number]  Dx: g66.709  B/B Witnessed by [facility]

## 2023-12-28 ENCOUNTER — Ambulatory Visit: Admitting: Psychiatry

## 2023-12-28 ENCOUNTER — Encounter: Payer: Self-pay | Admitting: Psychiatry

## 2023-12-28 DIAGNOSIS — F5105 Insomnia due to other mental disorder: Secondary | ICD-10-CM | POA: Diagnosis not present

## 2023-12-28 DIAGNOSIS — F331 Major depressive disorder, recurrent, moderate: Secondary | ICD-10-CM | POA: Diagnosis not present

## 2023-12-28 DIAGNOSIS — F411 Generalized anxiety disorder: Secondary | ICD-10-CM

## 2023-12-28 DIAGNOSIS — G4710 Hypersomnia, unspecified: Secondary | ICD-10-CM

## 2023-12-28 DIAGNOSIS — F900 Attention-deficit hyperactivity disorder, predominantly inattentive type: Secondary | ICD-10-CM

## 2023-12-28 DIAGNOSIS — F4010 Social phobia, unspecified: Secondary | ICD-10-CM

## 2023-12-28 NOTE — Progress Notes (Signed)
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 sweating  And heat intolerance with it.  Continue modafinil  150 mg and 50 mg Noon for hypersomnolence and ADD.  She doesn't tolerate traditional stimulants like Adderall and MPH..She has gotten benefit with it.  Disc pros and cons of increasing.  Protect sleep and try to get more if possible.    Modafinil  and Abilify  have corrected delayed sleep phase disorder.  Lithium  150 mg for neurocognitive protection.  Disc can increase WBC.  Previously disc Protect wt and disc risk osteoporosis.    Disc plans to pursue neuropsych testing but she doesn't have Alz for sure.  med changes. :  duloxetine  90,  Abilify  5,  modafinil  100 mg AM and 50 mg Noon,  lithium  150 mg daily,  NAC 1200 mg daily,  Tramadol  HS for chronic cough Increase Xanax  0.5 mg HS or go to bed early She is deferring Memantine  bc concerns over potential SE Option Off label trial of Auveltiy 1 at night for chronic tR cough in place of tramadol  which has brief benefit.  Disc SE Or instead trial DM 30-45 mg HS.  Given 2d6 interaction with duloxetine  it may work sufficiently without  need of bupropion  FU  4-6 mos bc doing well overall  Lorene Macintosh, MD, DFAPA   Please see After Visit Summary for patient specific instructions.  Future Appointments  Date Time Provider Department Center  01/10/2024  1:45 PM Gregg Lek, MD [facility] None  03/14/2024  1:00 PM Deveshwar,  Maya, MD [facility] None  03/19/2024  1:15 PM Whitfield, Harlene, NP [facility] None    No orders of the defined types were placed in this encounter.      -------------------------------

## 2024-01-02 ENCOUNTER — Other Ambulatory Visit (HOSPITAL_COMMUNITY): Payer: Self-pay

## 2024-01-09 ENCOUNTER — Other Ambulatory Visit: Payer: Self-pay | Admitting: Neurology

## 2024-01-09 ENCOUNTER — Other Ambulatory Visit (HOSPITAL_COMMUNITY): Payer: Self-pay

## 2024-01-10 ENCOUNTER — Encounter: Payer: Self-pay | Admitting: Neurology

## 2024-01-10 ENCOUNTER — Other Ambulatory Visit (HOSPITAL_COMMUNITY): Payer: Self-pay

## 2024-01-10 ENCOUNTER — Ambulatory Visit (INDEPENDENT_AMBULATORY_CARE_PROVIDER_SITE_OTHER): Admitting: Neurology

## 2024-01-10 ENCOUNTER — Other Ambulatory Visit: Payer: Self-pay

## 2024-01-10 VITALS — BP 136/79 | HR 95 | Ht 63.0 in | Wt 101.0 lb

## 2024-01-10 DIAGNOSIS — G43709 Chronic migraine without aura, not intractable, without status migrainosus: Secondary | ICD-10-CM

## 2024-01-10 MED ORDER — SUMATRIPTAN SUCCINATE 100 MG PO TABS
ORAL_TABLET | ORAL | 11 refills | Status: AC
  Filled 2024-01-10: qty 9, 30d supply, fill #0

## 2024-01-10 NOTE — Patient Instructions (Signed)
 Continue with Botox  injections with Harlene Continue sumatriptan  as needed for as abortive treatment Continue your other medications Return sooner if worse

## 2024-01-10 NOTE — Progress Notes (Signed)
 " [facility]    Provider:  Dr Ines Requesting Provider: No ref. provider found Primary Care Provider:  Pcp, No  CC:  Chronic migraines  HPI:  Lauren Bradley is a 73 y.o. female here as requested by No ref. provider found for migraines.  PMHx arthritis, osteopenia, migraines, depression, upper airway cough disease, remote B12 deficiency on supplementation, vitamin D  def on supplementation, fibromyalgia. She goes to an integrative doctor, she has 2 copies of MHTR and takes supplements for that methylfolate. Patient has had migraines for decades, saw Dr. Velinda and then Dr. Hagan and then Gofoth who is no longer in [place]. Tried multiple medications, a plethora of migraine medications. Very rarely has an aura with blocks, she takes magnesium. She wakes up with migraines, she did had a sleep test, she had a sleep test with Dr. Brutus and did not have OSA or other apnea necessitating cpap. Prior to having botox , she had 25 migraine days a month without aura, moderately to severe, would last 24-72 hours untreated, photophobia/phonophobia, smells would trigger, light was the worst, +nausea, hurts to move, a dark room helps, botox  has significantly improved her life, changed her life, she has been having botox  for years and continues to improve quality of life, no medication overuse, she only gets 4 migraines a month and < 10 total headache days a month. Never tried nurtec, ubrelvy , zavzpret. She can tell her headache is coming on, progresses slowly. Last botox  was 08/16/2021. No other focal neurologic deficits, associated symptoms, inciting events or modifiable factors. Had imagig in the past reportedly normal by patient and since she is so improved no indication for imaging at this time no vision changes, exertional headaches, morning headaches ongoing for decades and not changing. She is apatient of Dr. Gramig's for her CTS and cervical degenerative disease.  Reviewed notes, labs and  imaging from outside physicians, which showed:   Cbc with mild anemia, cmp unremarkable, b12 is elevated which is fine she take supplemenation.  Reviewed Dr. Carlean notes who is a neurologist from [facility] as below, meds tried:   Current and past medications: ANALGESICS: Tylenol/Acetaminophen.  Anti-Migraine: Relpax. Imitrex . maxalt Decongestant/Antihistamine: Allegra /Fexofenadine , Clarinex/Claritin, Dramamine/Dimenhydrinate, sudafed/Pseudoephedrine and Zyrtec/Cetirizine.  Anti-Nauseant: Phenergan/Promethazine.  NSAID's: Advil/Motrin/Ibuprofen.  Muscle Relaxants: Flexeril/Cyclobenzaprine, skelaxin Anti-Convulsants: Depakote/Valproic/Divalproex. Gabapentin, gralise, Topirmate Steriods: Prednisone .  Sleeping Pills/Tranquilizers: Ambien/Cr/Zolipem.  Anti-Depressants: Desipramine/Norpramine, Effexor/Venlafaxine, Paxil/Preva/Paroxetine, Wellbutrin/Buproprion and Zoloft/Sertraline. Cymbalta  Herbal: Coenzyme Q10, Feverfew and Magnesium.  BP meds: atenolol; propranolol contraindicated due to asthma (getting worked up for a cough, chronic cough) Other medications/therapies: Trigger Point injection. Botox     INTERVAL HISTORY 01/10/2024 She experiences chronic migraines, with a frequency that increased to about five days a week prior to her Botox  appointments. She primarily uses sumatriptan  for acute migraine relief, sometimes requiring more than one dose for adequate relief. She was previously approved for Ubrelvy  but never received the medication and may need to restart the approval process. Various preventive medications, including seizure drugs and tricyclic antidepressants, were tried in the past without effectiveness. She does not currently take any other preventive medications other than Botox .  She is concerned about her memory, noting a family history of dementia and Alzheimer's disease. Her father had four siblings with Alzheimer's, and her mother and three of her siblings had some form of  dementia. She experiences confusion while driving, difficulty concentrating, and challenges with decision-making. She has a history of ADHD and anxiety, which she believes may contribute to her memory issues. She is currently on medication for anxiety.  She describes her brain as 'really, really, really hyperactive' and struggles with relaxation. She has noticed a decline in her ability to perform tasks she previously managed well, such as paperwork, due to concentration difficulties.    Review of Systems: Patient complains of symptoms per HPI as well as the following symptoms migraines. Pertinent negatives and positives per HPI. All others negative.   Social History   Socioeconomic History   Marital status: Single    Spouse name: Not on file   Number of children: Not on file   Years of education: Not on file   Highest education level: Not on file  Occupational History   Not on file  Tobacco Use   Smoking status: Never    Passive exposure: Past   Smokeless tobacco: Never  Vaping Use   Vaping status: Never Used  Substance and Sexual Activity   Alcohol use: Not Currently   Drug use: No   Sexual activity: Not on file  Other Topics Concern   Not on file  Social History Narrative   Not on file   Social Drivers of Health   Tobacco Use: Low Risk (01/10/2024)   Patient History    Smoking Tobacco Use: Never    Smokeless Tobacco Use: Never    Passive Exposure: Past  Financial Resource Strain: Not on file  Food Insecurity: Not on file  Transportation Needs: Not on file  Physical Activity: Not on file  Stress: Not on file  Social Connections: Not on file  Intimate Partner Violence: Not on file  Depression (EYV7-0): Not on file  Alcohol Screen: Not on file  Housing: Not on file  Utilities: Not on file  Health Literacy: Not on file    Family History  Problem Relation Age of Onset   Fibromyalgia Mother    Kidney failure Mother    Mental illness Mother    Migraines Mother     Heart disease Father    COPD Father    Cancer Father        colon, liver    Migraines Paternal Aunt    Hypothyroidism Son     Past Medical History:  Diagnosis Date   Asthma    DDD (degenerative disc disease), cervical    DDD (degenerative disc disease), lumbar    Fatigue    Fibromyalgia    Infertility, female    Osteoarthritis     Patient Active Problem List   Diagnosis Date Noted   Upper airway cough syndrome 01/15/2021   Major depressive disorder, single episode, severe (HCC) 10/06/2017   History of vitamin D  deficiency 10/12/2016   History of seasonal allergies 12/29/2015   Fibromyalgia 12/29/2015   Other fatigue 12/29/2015   Primary osteoarthritis of both hands 12/29/2015   Primary osteoarthritis of both feet 12/29/2015   DJD (degenerative joint disease), cervical 12/29/2015   DDD (degenerative disc disease), lumbar 12/29/2015   Osteopenia of multiple sites 12/29/2015   History of migraine 12/29/2015    Past Surgical History:  Procedure Laterality Date   CESAREAN SECTION     FOOT SURGERY     GALLBLADDER SURGERY     KNEE SURGERY      Current Outpatient Medications  Medication Sig Dispense Refill   albuterol  (VENTOLIN  HFA) 108 (90 Base) MCG/ACT inhaler Inhale 1 puff into the lungs every 6 (six) hours. 18 g 5   albuterol  (VENTOLIN  HFA) 108 (90 Base) MCG/ACT inhaler Inhale 2 puffs into the lungs every 6 (six) hours as needed for wheezing. 18  g 11   ALPRAZolam  (XANAX ) 0.25 MG tablet Take 1-2 tablets by mouth at night as needed and one as needed for anxiety daily. 90 tablet 2   ARIPiprazole  (ABILIFY ) 5 MG tablet Take 1 tablet (5 mg total) by mouth daily. 90 tablet 0   ARIPiprazole  (ABILIFY ) 5 MG tablet Take 1 tablet (5 mg total) by mouth daily. 90 tablet 0   Azelastine  HCl 137 MCG/SPRAY SOLN Place 1 spray into both nostrils 2 (two) times daily. 30 mL 5   botulinum toxin Type A  (BOTOX ) 200 units injection Provider to inject 155 units intramuscularly into head and neck  every 12 weeks. Discard remainder. 1 each 1   calcium carbonate (OS-CAL) 600 MG TABS tablet Take 600 mg by mouth.     DULoxetine  (CYMBALTA ) 30 MG capsule Take 3 capsules (90 mg total) by mouth daily. 270 capsule 0   estradiol  (DOTTI ) 0.075 MG/24HR Place 1 patch onto the skin 2 (two) times a week. 24 patch 3   famotidine  (PEPCID ) 40 MG tablet Take 1 tablet (40 mg total) by mouth 2 (two) times daily. 180 tablet 3   fexofenadine  (ALLEGRA  ALLERGY ) 180 MG tablet Take 1 tablet (180 mg total) by mouth daily. 90 tablet 3   fexofenadine  (ALLEGRA  ALLERGY ) 180 MG tablet Take 1 tablet (180 mg total) by mouth daily. 90 tablet 3   fluconazole  (DIFLUCAN ) 200 MG tablet Take 1 tablet (200 mg total) by mouth every 7 (seven) days. 12 tablet 0   fluorouracil  (EFUDEX ) 5 % cream Apply 1 application Externally Once a day 40 g 0   ipratropium (ATROVENT ) 0.03 % nasal spray Place 2 sprays into both nostrils 2 (two) times daily. 30 mL 3   Levomefolate Glucosamine (METHYLFOLATE PO) Take by mouth.     lithium  carbonate 150 MG capsule Take 1 capsule (150 mg total) by mouth daily. 90 capsule 3   MAGNESIUM MALATE PO Take by mouth daily.     memantine  (NAMENDA ) 10 MG tablet Take 1/2 tablet daily for 1 week,  then 1/2 tablet twice daily for 1 week, then 1/2 tablet in the morning and 1 tablet at night for 1 week, then 1 tablet twice daily. (Patient not taking: Reported on 12/28/2023) 180 tablet 0   modafinil  (PROVIGIL ) 100 MG tablet Take 1.5 tablets (150 mg total) by mouth in the morning AND 0.5 tablets (50 mg total) daily at 12 noon. 60 tablet 5   naratriptan  (AMERGE) 2.5 MG tablet Take 1 tablet (2.5 mg total) by mouth as needed for migraine. Take one (1) tablet at onset of headache; if returns or does not resolve, may repeat after 4 hours; do not exceed five (5) mg in 24 hours. 10 tablet 11   ondansetron  (ZOFRAN -ODT) 4 MG disintegrating tablet Dissolve 1-2 tablets (4-8 mg total) by mouth every 8 (eight) hours as needed. 30 tablet 3    progesterone  (PROMETRIUM ) 200 MG capsule Take 1 capsule (200 mg total) by mouth at bedtime. 90 capsule 3   SUMAtriptan  (IMITREX ) 100 MG tablet Take 1 tablet by mouth at onset of migraine. May repeat in 2 hours if needed (max 2 tabs/24 hours) 9 tablet 11   thyroid  (ARMOUR THYROID ) 15 MG tablet Take 1 tablet (15 mg total) by mouth daily on an empty stomach. 90 tablet 4   thyroid  (ARMOUR THYROID ) 90 MG tablet Take 1 tablet (90 mg total) by mouth daily on an empty stomach. 90 tablet 3   traMADol  (ULTRAM ) 50 MG tablet Take 1 tablet (50  mg total) by mouth 2 (two) times daily as needed. 60 tablet 5   tretinoin (RETIN-A) 0.025 % cream      Ubrogepant  (UBRELVY ) 100 MG TABS Take 1 tablet onset migraine, take another tablet if needed 2 hr later (max 2/24hrs) 16 tablet 11   Vitamin D , Ergocalciferol , (DRISDOL ) 1.25 MG (50000 UNIT) CAPS capsule Take 1 capsule (50,000 Units total) by mouth once a week. 12 capsule 3   No current facility-administered medications for this visit.    Allergies as of 01/10/2024 - Review Complete 01/10/2024  Allergen Reaction Noted   Hydrocodone Itching 08/01/2012   Codeine Nausea And Vomiting 09/14/2012   Other     Sulfa antibiotics  08/12/2011   Thimerosal (thiomersal) Other (See Comments) 02/28/2023   Cephalosporins Rash 10/21/2010   Sulfamethoxazole Rash 10/21/2010    Vitals: BP 136/79 (BP Location: Right Arm, Patient Position: Sitting, Cuff Size: Normal)   Pulse 95   Ht 5' 3 (1.6 m)   Wt 101 lb (45.8 kg)   SpO2 95%   BMI 17.89 kg/m  Last Weight:  Wt Readings from Last 1 Encounters:  01/10/24 101 lb (45.8 kg)   Last Height:   Ht Readings from Last 1 Encounters:  01/10/24 5' 3 (1.6 m)    Neuro exam: Speech:    Speech is normal; fluent and spontaneous with normal comprehension.  Cognition:    The patient is oriented to person, place, and time;     recent and remote memory intact;     language fluent;     normal attention, concentration,     fund of  knowledge Cranial Nerves: Visual fields are full to finger confrontation. Extraocular movements are intact. Trigeminal sensation is intact and the muscles of mastication are normal. The face is symmetric. Hearing intact. Voice is normal. Shoulder shrug is normal. The tongue has normal motion without fasciculations.   Coordination:    Normal  Gait:    normal.   Motor Observation:    No asymmetry, no atrophy, and no involuntary movements noted. Tone:    Normal muscle tone.      Strength:    Strength is V/V in the upper and lower limbs.      Sensation: intact to LT      Assessment/Plan:    Chronic migraine Chronic migraines with increased frequency before Botox  appointments, occurring approximately five days out of seven. Previously managed with sumatriptan , but Ubrelvy  was not received due to a lapse in the process. Botox  has significantly improved quality of life, reducing migraine frequency and severity. No other preventive medications have been effective in the past. - Continue Botox  injections with Harlene. - Continue sumatriptan  as needed for acute migraine attacks. - Restart process for Ubrelvy  prescription.  Memory loss Concerns about memory loss, potentially related to ADHD and anxiety. Family history of dementia. Symptoms include difficulty concentrating, decision-making challenges, and occasional confusion while driving. No current indication for dementia, but testing is considered due to family history and symptoms. Memantine  is not indicated unless moderate to severe dementia is present. Aricept may be considered for mild dementia. - Request referral from ADHD doctor for memory testing. - Have son provide observations of memory changes if possible.    No orders of the defined types were placed in this encounter.  No orders of the defined types were placed in this encounter.   Pastor Falling, MD [facility] [address] [place],  [place] [address]  Phone [phone] Fax [phone]  1. Chronic migraine without aura without status migrainosus, not intractable      "

## 2024-01-20 ENCOUNTER — Encounter: Payer: Self-pay | Admitting: Gastroenterology

## 2024-03-14 ENCOUNTER — Ambulatory Visit: Admitting: Rheumatology

## 2024-03-14 ENCOUNTER — Ambulatory Visit: Admitting: Gastroenterology

## 2024-03-19 ENCOUNTER — Ambulatory Visit: Admitting: Adult Health
